# Patient Record
Sex: Male | Born: 1960 | Race: White | Hispanic: No | Marital: Married | Smoking: Current every day smoker
Health system: Southern US, Community
[De-identification: ages and names within clinical notes are randomized; demographics above are authoritative.]

## PROBLEM LIST (undated history)

## (undated) DIAGNOSIS — K760 Fatty (change of) liver, not elsewhere classified: Secondary | ICD-10-CM

## (undated) DIAGNOSIS — I219 Acute myocardial infarction, unspecified: Secondary | ICD-10-CM

## (undated) DIAGNOSIS — I2510 Atherosclerotic heart disease of native coronary artery without angina pectoris: Secondary | ICD-10-CM

## (undated) DIAGNOSIS — J189 Pneumonia, unspecified organism: Secondary | ICD-10-CM

## (undated) DIAGNOSIS — C801 Malignant (primary) neoplasm, unspecified: Secondary | ICD-10-CM

## (undated) DIAGNOSIS — F419 Anxiety disorder, unspecified: Secondary | ICD-10-CM

## (undated) DIAGNOSIS — E7800 Pure hypercholesterolemia, unspecified: Secondary | ICD-10-CM

## (undated) DIAGNOSIS — I10 Essential (primary) hypertension: Secondary | ICD-10-CM

## (undated) DIAGNOSIS — J449 Chronic obstructive pulmonary disease, unspecified: Secondary | ICD-10-CM

## (undated) DIAGNOSIS — F32A Depression, unspecified: Secondary | ICD-10-CM

## (undated) HISTORY — PX: APPENDECTOMY: SHX54

## (undated) HISTORY — DX: Atherosclerotic heart disease of native coronary artery without angina pectoris: I25.10

## (undated) HISTORY — DX: Fatty (change of) liver, not elsewhere classified: K76.0

---

## 1998-04-05 ENCOUNTER — Emergency Department (HOSPITAL_COMMUNITY): Admission: EM | Admit: 1998-04-05 | Discharge: 1998-04-05 | Payer: Self-pay

## 2000-12-11 ENCOUNTER — Emergency Department (HOSPITAL_COMMUNITY): Admission: EM | Admit: 2000-12-11 | Discharge: 2000-12-11 | Payer: Self-pay | Admitting: Emergency Medicine

## 2000-12-11 ENCOUNTER — Encounter: Payer: Self-pay | Admitting: Emergency Medicine

## 2003-10-18 DIAGNOSIS — I219 Acute myocardial infarction, unspecified: Secondary | ICD-10-CM

## 2003-10-18 HISTORY — DX: Acute myocardial infarction, unspecified: I21.9

## 2004-01-25 ENCOUNTER — Inpatient Hospital Stay (HOSPITAL_COMMUNITY): Admission: AD | Admit: 2004-01-25 | Discharge: 2004-01-29 | Payer: Self-pay | Admitting: Emergency Medicine

## 2004-01-25 HISTORY — PX: CARDIAC CATHETERIZATION: SHX172

## 2004-12-31 ENCOUNTER — Inpatient Hospital Stay (HOSPITAL_COMMUNITY): Admission: EM | Admit: 2004-12-31 | Discharge: 2005-01-03 | Payer: Self-pay | Admitting: Orthopedic Surgery

## 2005-01-03 HISTORY — PX: CARDIAC CATHETERIZATION: SHX172

## 2005-05-12 ENCOUNTER — Inpatient Hospital Stay (HOSPITAL_COMMUNITY): Admission: EM | Admit: 2005-05-12 | Discharge: 2005-05-15 | Payer: Self-pay | Admitting: Emergency Medicine

## 2005-05-12 ENCOUNTER — Ambulatory Visit (HOSPITAL_COMMUNITY): Admission: RE | Admit: 2005-05-12 | Discharge: 2005-05-12 | Payer: Self-pay | Admitting: Family Medicine

## 2005-05-12 ENCOUNTER — Encounter (INDEPENDENT_AMBULATORY_CARE_PROVIDER_SITE_OTHER): Payer: Self-pay | Admitting: General Surgery

## 2005-05-24 ENCOUNTER — Inpatient Hospital Stay (HOSPITAL_COMMUNITY): Admission: EM | Admit: 2005-05-24 | Discharge: 2005-05-27 | Payer: Self-pay | Admitting: *Deleted

## 2005-06-12 ENCOUNTER — Emergency Department (HOSPITAL_COMMUNITY): Admission: EM | Admit: 2005-06-12 | Discharge: 2005-06-13 | Payer: Self-pay | Admitting: Emergency Medicine

## 2006-06-12 ENCOUNTER — Ambulatory Visit (HOSPITAL_COMMUNITY): Admission: RE | Admit: 2006-06-12 | Discharge: 2006-06-12 | Payer: Self-pay | Admitting: Family Medicine

## 2006-09-29 ENCOUNTER — Ambulatory Visit: Payer: Self-pay | Admitting: Internal Medicine

## 2007-01-30 ENCOUNTER — Ambulatory Visit (HOSPITAL_COMMUNITY): Admission: RE | Admit: 2007-01-30 | Discharge: 2007-01-30 | Payer: Self-pay | Admitting: Family Medicine

## 2007-05-15 ENCOUNTER — Ambulatory Visit: Payer: Self-pay | Admitting: Internal Medicine

## 2007-08-08 IMAGING — CT CT ABDOMEN W/ CM
1 of 3 series · 14 of 32 positions shown, 19 images · IV contrast (omnipaque)
Comparison: 05/12/05.

CLINICAL DATA: Abdominal pain.  Appendix removed on 05/11/05.
 ABDOMEN CT WITH CONTRAST:
TECHNIQUE: Multidetector CT imaging of the abdomen was performed following the standard protocol during bolus administration of intravenous contrast.
 Contrast:  150 cc Omnipaque 300.
TECHNIQUE: Multidetector CT imaging of the pelvis was performed following the standard protocol during bolus administration of intravenous contrast.

[Series 6513: — · axial · 0.88mm/px · z∈[+1193,+1623]mm · 14 of 98 slices shown, 19 images]
[im 6/98  soft-tissue]
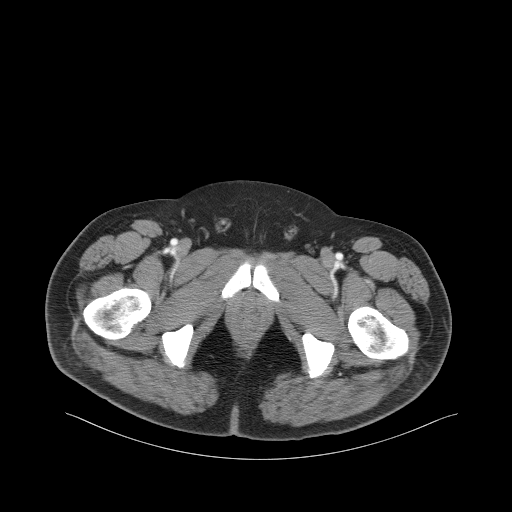
[im 6/98  bone]
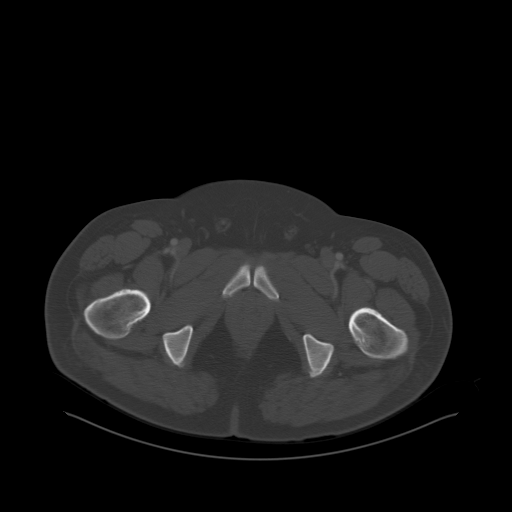
[im 11/98  soft-tissue]
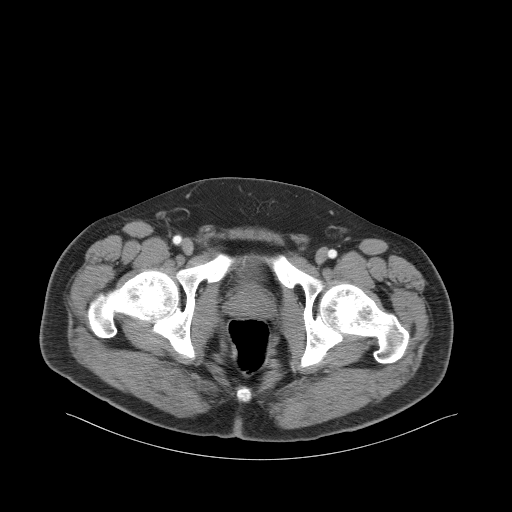
[im 22/98  soft-tissue]
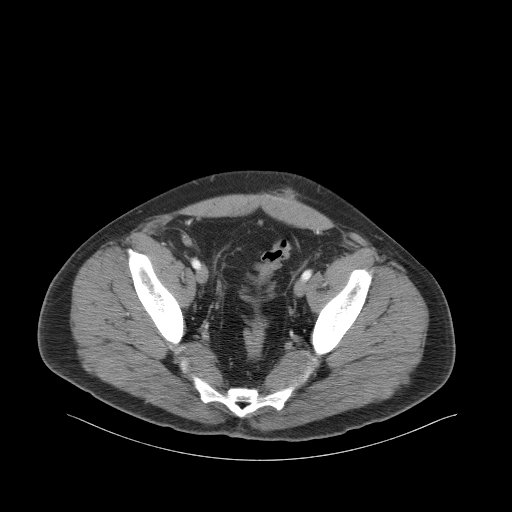
[im 27/98  soft-tissue]
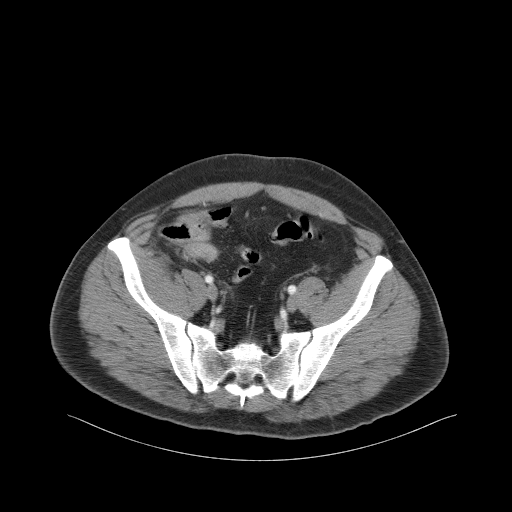
[im 33/98  soft-tissue]
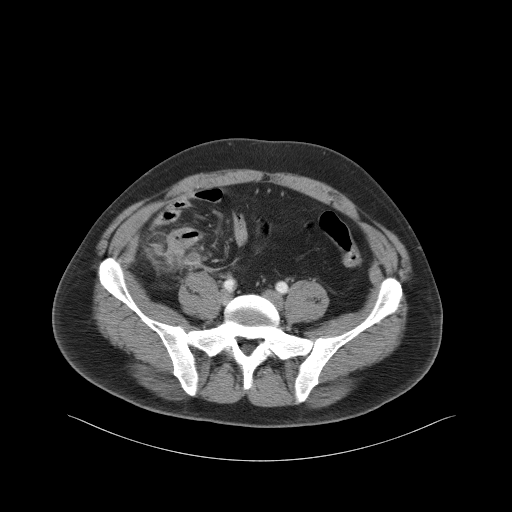
[im 44/98  soft-tissue]
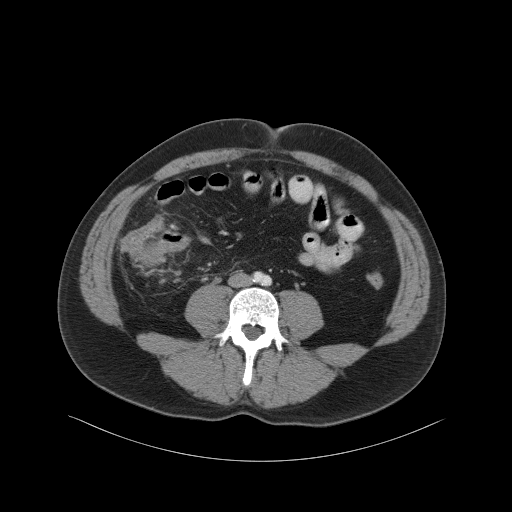
[im 49/98  soft-tissue]
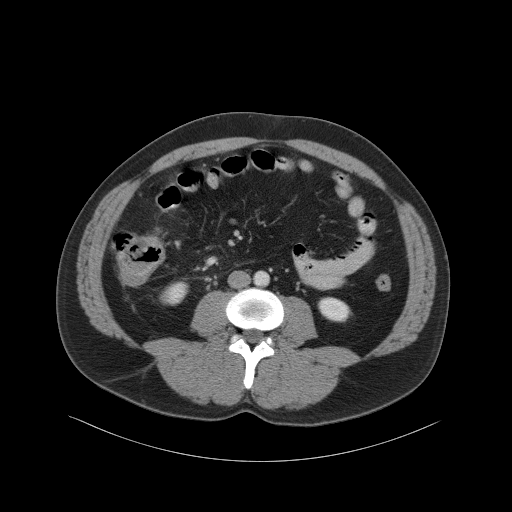
[im 54/98  soft-tissue]
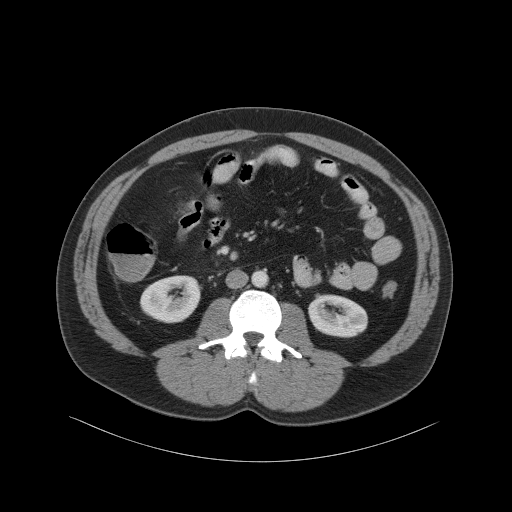
[im 65/98  soft-tissue]
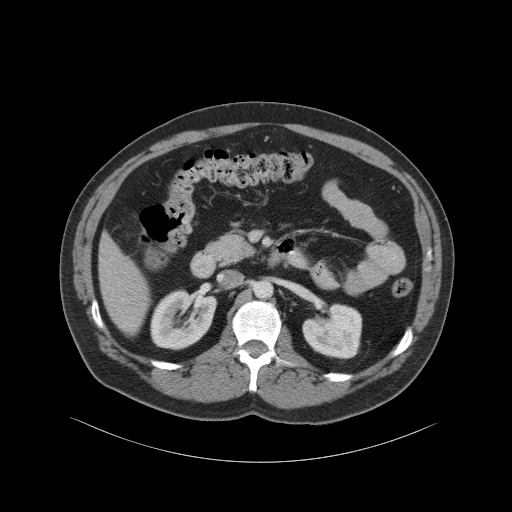
[im 65/98  bone]
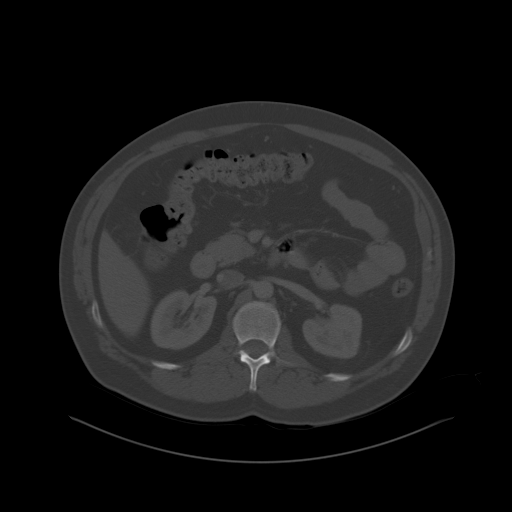
[im 71/98  soft-tissue]
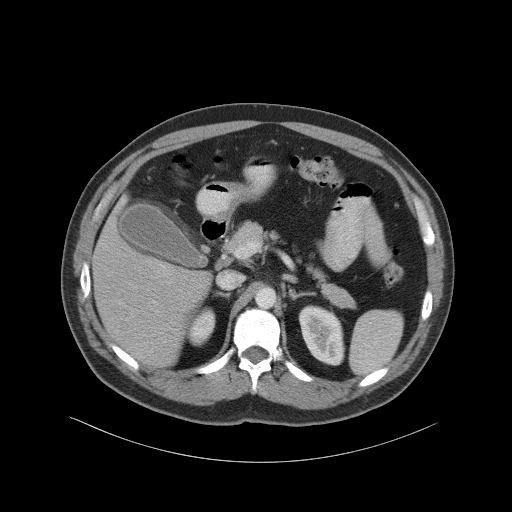
[im 76/98  soft-tissue]
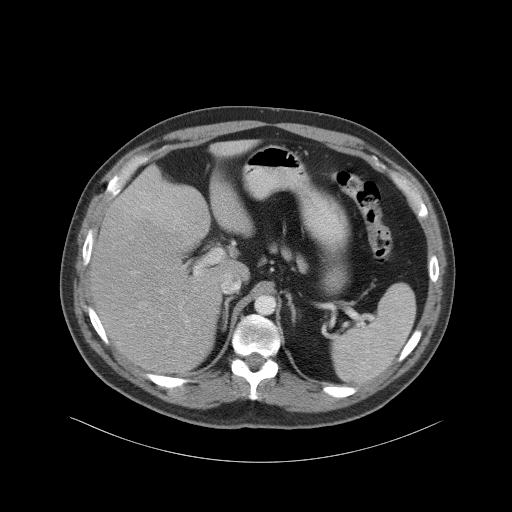
[im 76/98  lung]
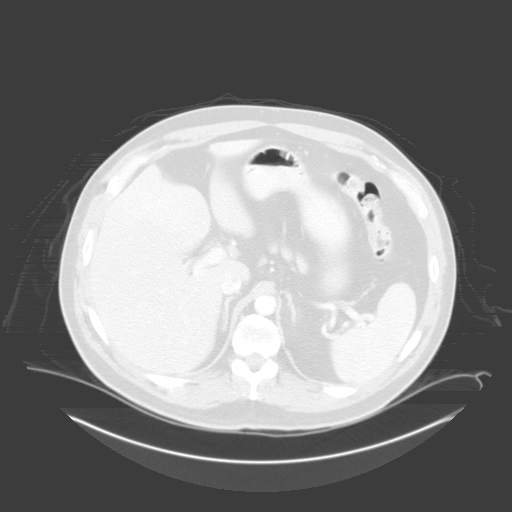
[im 81/98  lung]
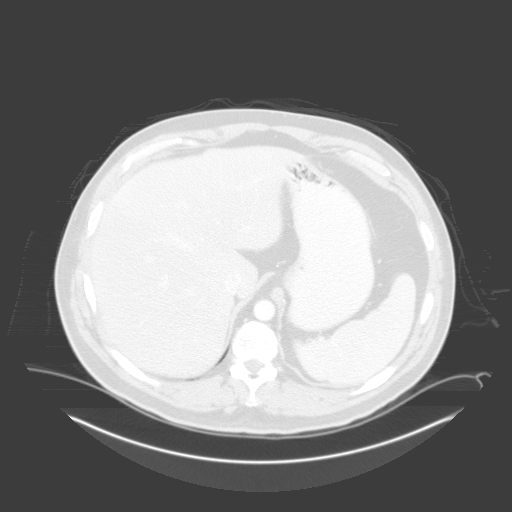
[im 87/98  soft-tissue]
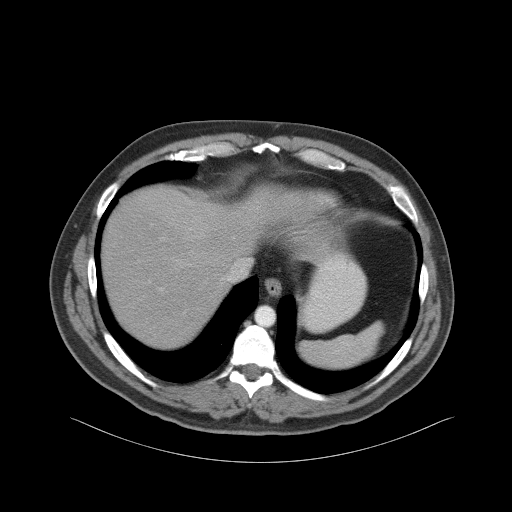
[im 87/98  lung]
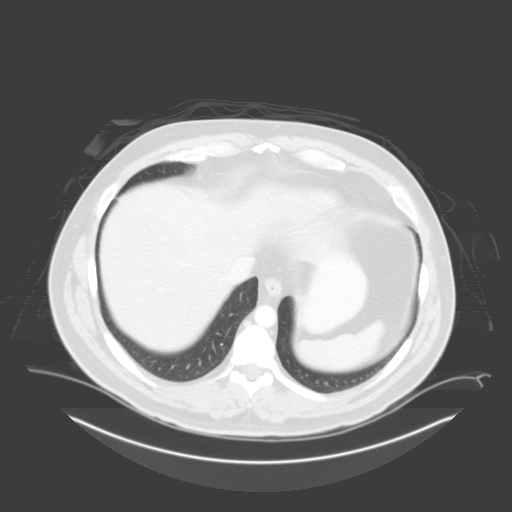
[im 92/98  soft-tissue]
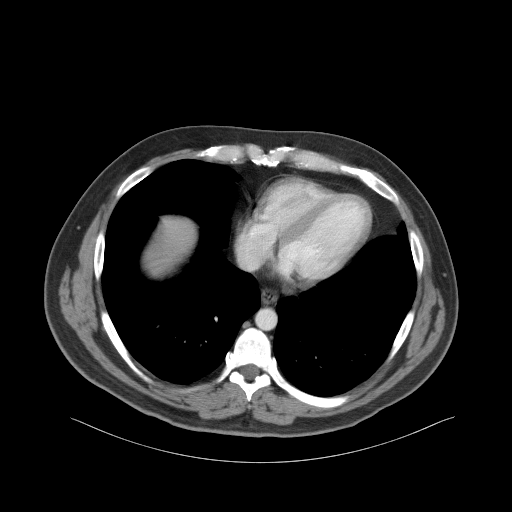
[im 92/98  lung]
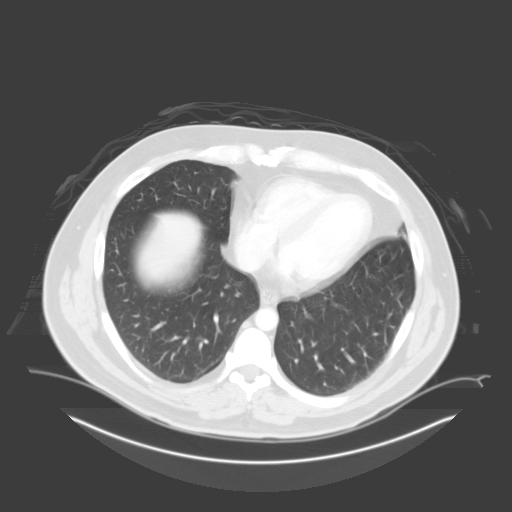

[14 of 32 positions shown; findings below may reference images not displayed]

FINDINGS: There is subsegmental atelectasis in the lingula.  
 There is gallbladder wall thickening and potentially mild pericholecystic fluid.  Correlate clinically in assessing for acute cholecystitis.
 No intrahepatic bile duct dilatation is evident.  The spleen, adrenal glands, and kidneys appear unremarkable.  There are at least two left renal arteries.  
 No retroperitoneal adenopathy.  The pancreas appears normal.  No dilated upper abdominal small bowel.
IMPRESSION: Wall thickening in the gallbladder, cannot excluded cholecystitis.
 PELVIS CT WITH CONTRAST:
FINDINGS: There are several small fluid density collections immediately adjacent to the cecum and along the clips associated with the appendectomy.  Additionally, the terminal ileum, which is in this immediate vicinity, is thick-walled, and there is surrounding mesenteric stranding slightly greater than would be expected for removal of the appendix 11 days ago.  The possibility of early abscess formation in this region is raised, although these could simply be postoperative fluid collections.  Correlate with the location of the patient?s pain and leukocytosis. 
 No free pelvic fluid is evident.
IMPRESSION: Thick-walled cecum and terminal ileum with several small fluid density collections in the region of the appendectomy, suspicious for the possibility of early abscess formation, although postoperative fluid collections could conceivably have a similar appearance.  Correlate with the location of the patient?s pain and with any leukocytosis.

## 2007-08-08 IMAGING — CR DG ABDOMEN 2V
2 series · 2 of 2 positions shown · non-contrast
Comparison: CT of the abdomen from 05/12/05.

CLINICAL DATA: Abdominal pain.  Nausea and vomiting. 
 2-VIEW ABDOMEN:

[view not recorded (1 of 2)]
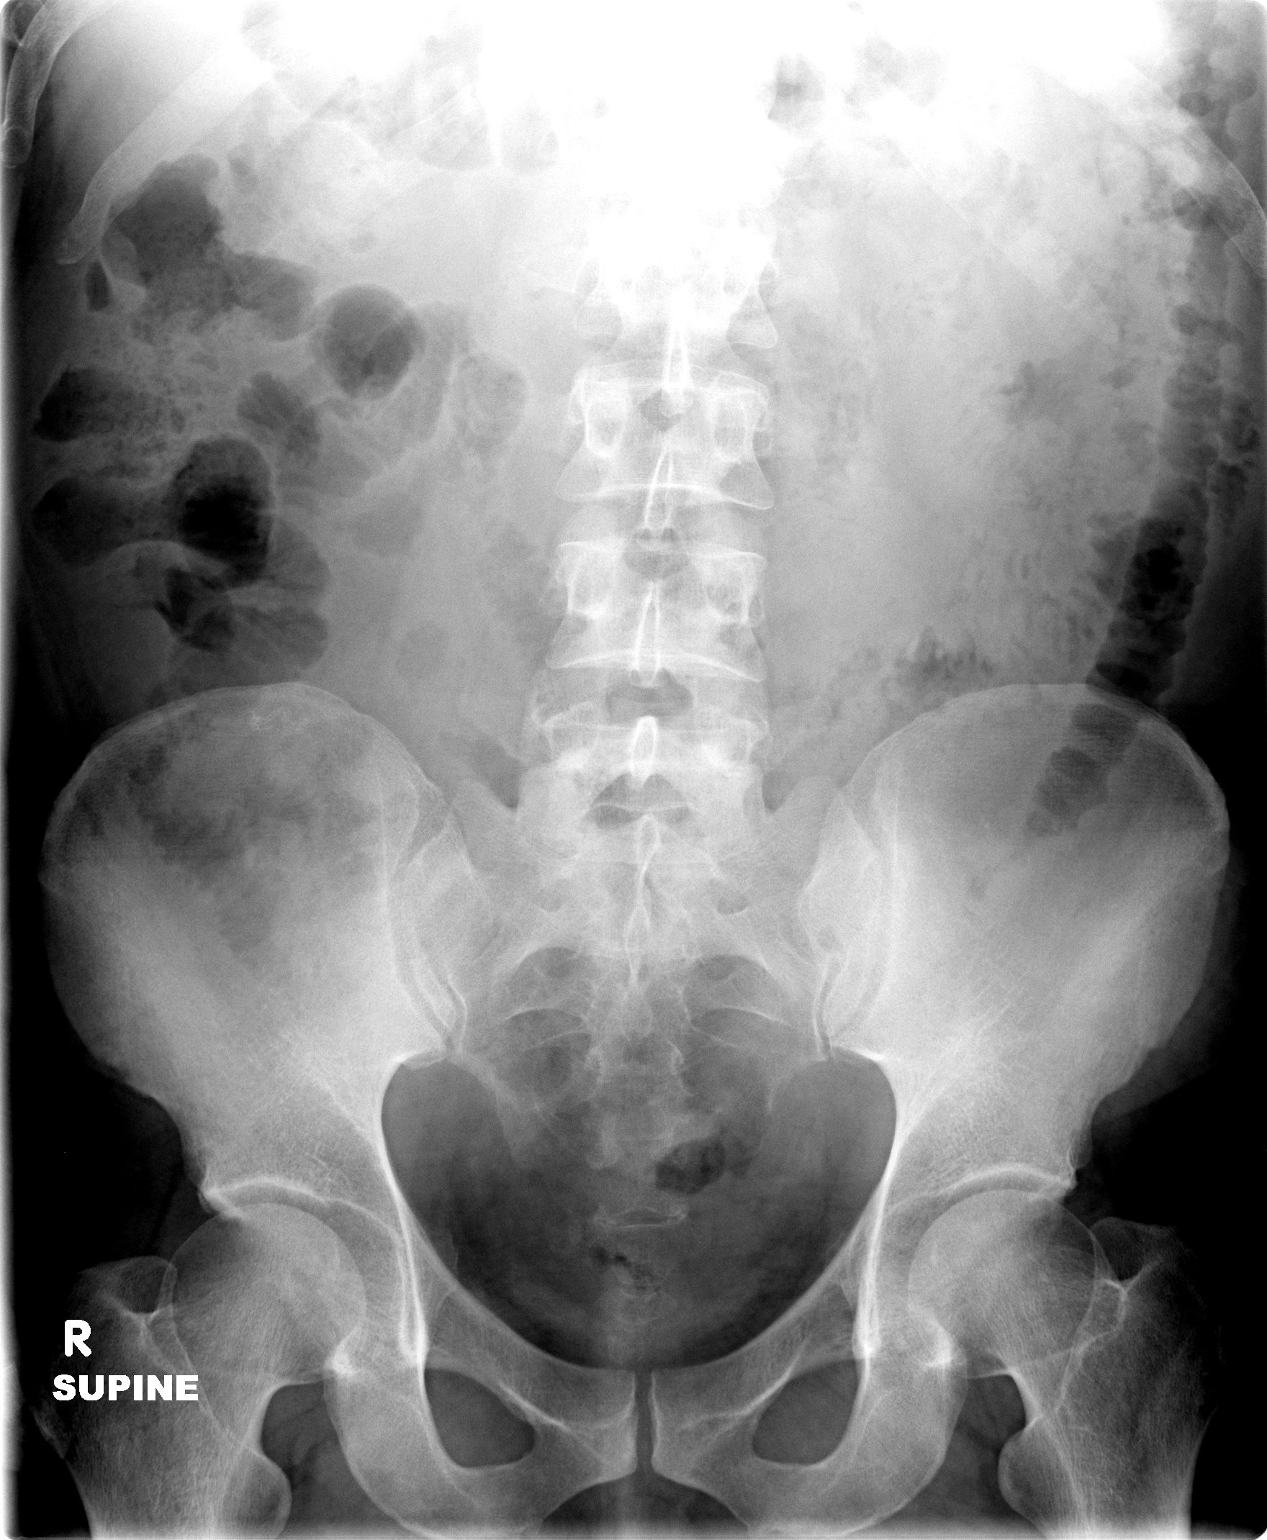

[view not recorded (2 of 2)]
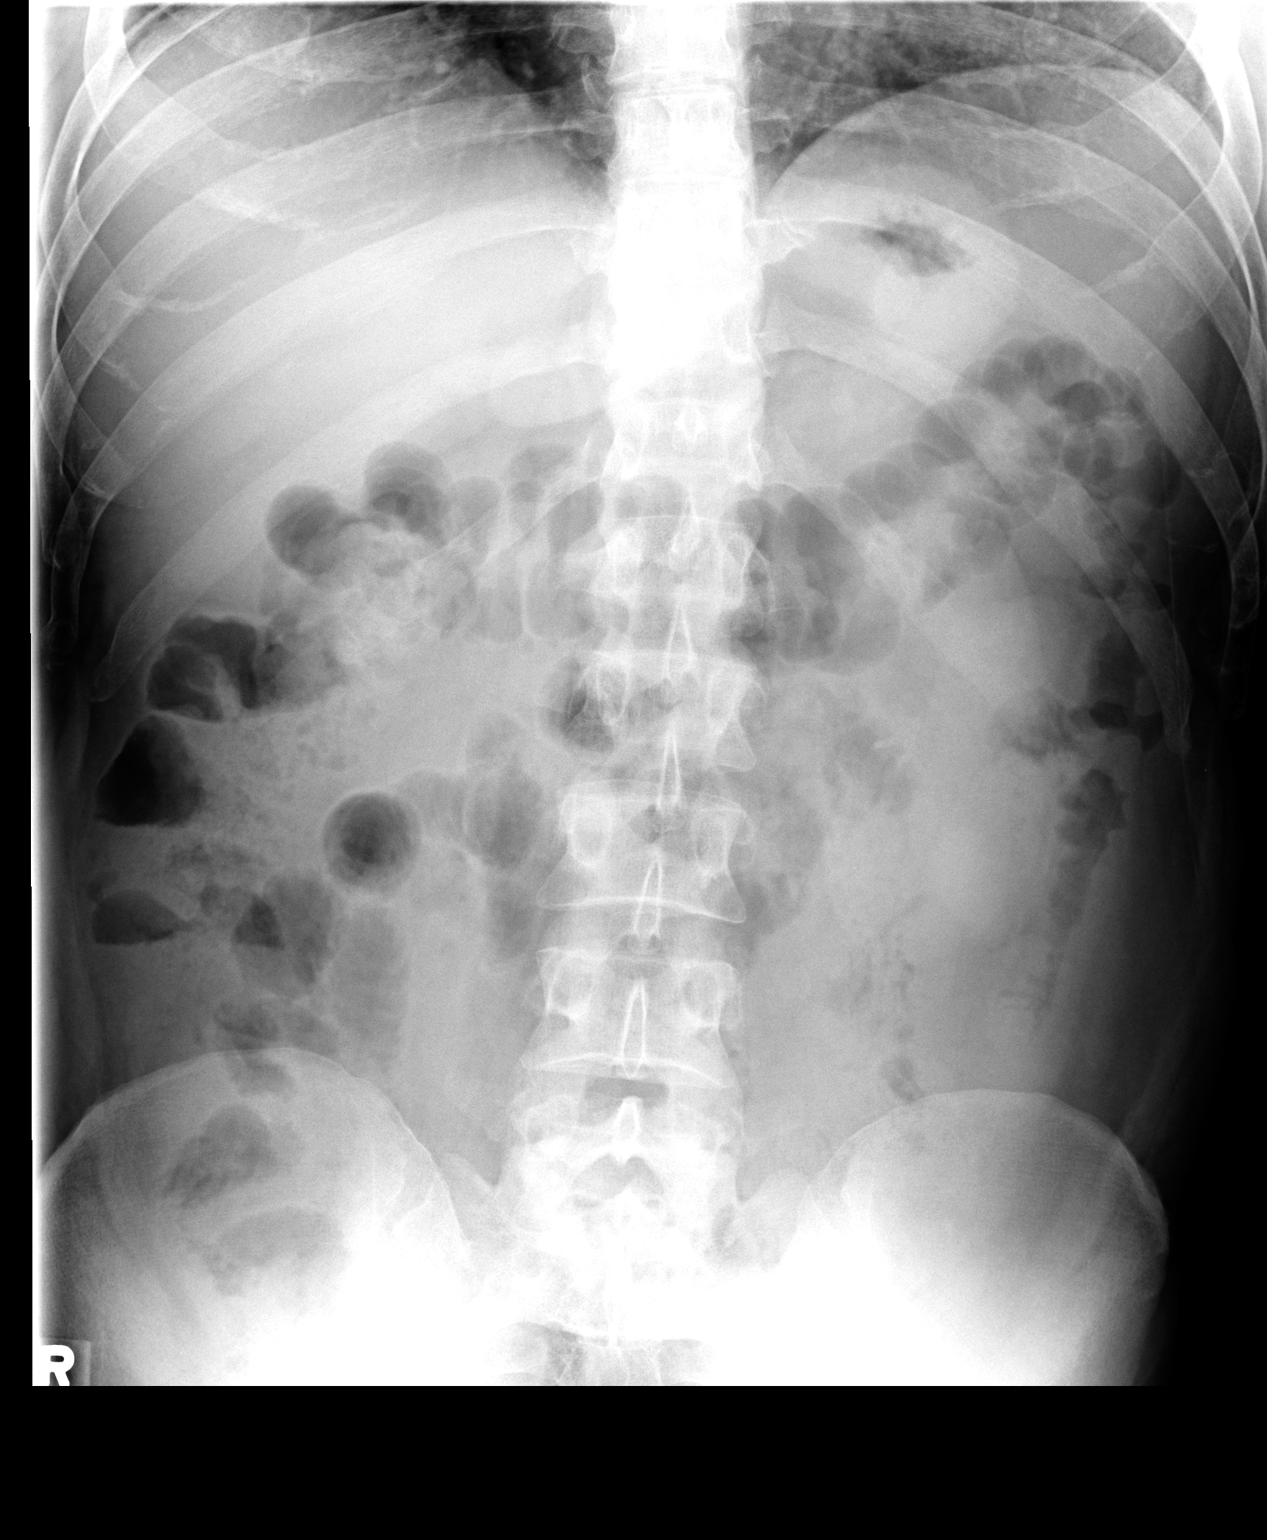

[2 of 2 positions shown; findings below may reference images not displayed]

FINDINGS: No dilated bowel.  Gas and stool are present in the colon and there is scattered gas in nondilated small bowel.  No significant abnormal air fluid levels.
IMPRESSION: No evidence of dilated bowel or abnormal air fluid levels.

## 2010-04-05 ENCOUNTER — Emergency Department (HOSPITAL_COMMUNITY): Admission: EM | Admit: 2010-04-05 | Discharge: 2010-04-05 | Payer: Self-pay | Admitting: Emergency Medicine

## 2010-11-17 HISTORY — PX: NM MYOCAR PERF WALL MOTION: HXRAD629

## 2011-01-02 LAB — GLUCOSE, CAPILLARY
Glucose-Capillary: 248 mg/dL — ABNORMAL HIGH (ref 70–99)
Glucose-Capillary: 302 mg/dL — ABNORMAL HIGH (ref 70–99)

## 2011-01-02 LAB — URINALYSIS, ROUTINE W REFLEX MICROSCOPIC
Bilirubin Urine: NEGATIVE
Glucose, UA: >1000 mg/dL — AB
Ketones, ur: NEGATIVE mg/dL
Leukocytes, UA: NEGATIVE
Nitrite: NEGATIVE
Protein, ur: NEGATIVE mg/dL
Specific Gravity, Urine: 1.025 (ref 1.005–1.030)
Urobilinogen, UA: 0.2 mg/dL (ref 0.0–1.0)
pH: 5.5 (ref 5.0–8.0)

## 2011-01-02 LAB — URINE MICROSCOPIC-ADD ON

## 2011-03-01 NOTE — Assessment & Plan Note (Signed)
NAME:  DEWANE, TIMSON                CHART#:  [id number]   DATE:  05/15/2007                       DOB:  1961-04-30   CHIEF COMPLAINT:  Followup of liver.   SUBJECTIVE:  Raphael is here for a followup.  He has a history of elevated  transaminases, particularly the SGPT.  His labs were checked on April 09, 2007 and his SGPT  was 76, which was up from 60 back in April 2008.  He  was seen in initial consultation by Dr. Jena Gauss in December 2007.  The  patient is notably significantly over his ideal body weight and has  hyperlipidemia.  His SGPT has been elevated for more than 2 years.  He  has been off-and-on statin therapy for a good part of this time.  Currently he is on Tricor.  It is felt that he most likely has  steatohepatitis with associated metabolic syndrome and some of his  elevations of the SGPT may be related to his statin therapy.  He ruled  out for chronic hepatitis B or C, as well as iron overload.  The patient  unfortunately has not been able to lose any weight.  He is actually  gained 5 pounds since we initially saw him in December 2007.  He also  admittedly continues to consume alcohol, although he reports it is very  limited.  He states he consumes about 6 beers a month.  He is no longer  drinking his family's white lightening.  He denies any nausea or  vomiting, abdominal pain, constipation, diarrhea, melena or rectal  bleeding.   CURRENT MEDICATIONS:  See updated list.   ALLERGIES:  No known drug allergies.   PHYSICAL EXAMINATION:  Weight 211, height 5 feet 6 inches, temp 98.1,  blood pressure  118/82, pulse 60.  His BMI is 34.1.  GENERAL:  A pleasant, mildly obese Caucasian male in no acute distress.  SKIN:  Warm and dry, no jaundice.  HEENT:  Sclerae nonicteric, oropharyngeal mucosa moist and pink.  ABDOMEN:  Obese.  Positive bowel sounds.  Soft, nontender.  No  organomegaly or masses.  No rebound tenderness or guarding, no abdominal  bruits or hernias.  EXTREMITIES:  No edema.   IMPRESSION:  Mr. Pavich is a 50 year-old gentleman with history of  elevated SGPT, most likely related to underlying steatohepatitis and/or  lipid lowering medications.  He really has not put forth much effort  with regards to weight loss or regular aerobic exercise.  He continues  to drink, although he states this is on a limited basis.   PLAN:  At this point in time, I have encouraged Raju to pursue aerobic  activity at least 3 times weekly and would like for him to lose 10  pounds over the course of the next 3 months.  I recommend that he  discontinue all alcohol consumption.  We will plan on rechecking his  LFTs in  October 2008.  If his SGPT continues to rise, he may be looking at  having a liver biopsy in the near future.       Tana Coast, P.A.  Electronically Signed     R. Roetta Sessions, M.D.  Electronically Signed    LL/MEDQ  D:  05/15/2007  T:  05/16/2007  Job:  [id number]   cc:   Delorise Jackson,  M.D. Domingo Sep

## 2011-03-04 NOTE — Cardiovascular Report (Signed)
NAME:  Nathan [name], Nathan [name] NO.:  [id number]   MEDICAL RECORD NO.:  [id number]                   PATIENT TYPE:  INP   LOCATION:  2930                                 FACILITY:  [facility]   PHYSICIAN:  Nicki Guadalajara, M.D.                  DATE OF BIRTH:  09/18/1961   DATE OF PROCEDURE:  01/25/2004  DATE OF DISCHARGE:                              CARDIAC CATHETERIZATION   INDICATIONS:  [name] is 50 year old gentleman who has a history  of hypertension and greater than 25 years of tobacco use.  He has been  having stuttering chest pain intermittently over the last several days.  Last evening at approximately 10:30 p.m., his chest pain became more  intense.  At approximately 3:30 this morning, he presented to [facility]  Emergency Room where he was found to have mild ST segment elevation changes  inferiorly.  His pain was coming and going.  He was treated with integrilin  and ultimately brought to the catheterization laboratory for emergent  cardiac catheterization.   DESCRIPTION OF PROCEDURE:  After premedication with Versed 2 mg  intravenously, the patient was prepped and draped in the usual fashion.  His  right femoral artery was punctured anteriorly and a 6 French sheath was  inserted.  Diagnostic catheterization was done with 6 French Judkins 4 left  and right coronary catheters.  A 6 French pigtail catheter was used for  biplane cine left ventriculography.  Distal aortography was also performed.  With the demonstration of subtotal stenosis in the RCA, attention was then  directed at primary intervention.  An ACT was documented.  The patient  received an additional 2000 units of heparin.  600 mg of oral Plavix was  given.  Initially, 6 [place] JR guide was used, but due to catheter dampening  this was changed to a 6 [place] right catheter with side holes.  A Luge wire  was advanced down the RCA, across the subtotal stenosis.  Predilation was  done with  a 2.25 x 15-mm Maverick balloon to 10 atmospheres.  A 3.5 x 18-mm  drug-eluting stent Cypher stent was then successfully deployed at dilated to  16 atmospheres.  Post stent dilatation was done utilizing a 3.75 x 15-mm  Quantum balloon to 12 atmospheres.  Scout angiography confirmed an excellent  angiographic result.  The patient did receive several additional doses of IC  nitroglycerin during the procedure.  ACT was documented to be therapeutic at  the completion of the procedure.  The arterial sheath was sutured in place  with plans for sheath removal later today.   HEMODYNAMIC DATA:  Central aortic pressure was 100/72, left ventricular  pressure 100/14, post A-wave 21.   ANGIOGRAPHIC DATA:  The left main coronary artery was angiographically  normal and bifurcated into an LAD and left circumflex system.   The LAD gave rise to proximal diagonal vessel.  The  diagonal vessel in its  mid segment bifurcated into two small limbs.  There was 70% stenosis  diffusely in the superior limb and 80% stenosis followed by 50% stenosis in  the inferior limb in the mid portion of this diagonal vessel.  The LAD  beyond the diagonal had 20-30% smooth somewhat eccentric narrowing.   The circumflex vessel gave rise to a prominent marginal vessel. There was  30% proximal narrowing in this marginal vessel.   The right coronary artery was a dominant vessel.  There was 99% stenosis  with mild thrombus in the mid segment of the vessel in the region of an  anterior RV marginal takeoff.  The distal RCA gave rise to a PDA and a  posterior lateral system.  There was evidence for probable spasm of the  proximal RCA which improved following IC nitroglycerin administration.  Following successful percutaneous transluminal coronary angioplasty with  stenting utilizing a 3.5 x 18-mm drug-eluting stent Cypher stent post  dilated to 3.75 mm, the 99% stenosis was reduced to 0%.  The initial TIMI-  2.5 to 3 flow was  improved to TIMI-3.  There was no evidence for dissection.   Biplane cine left ventriculography done prior to the intervention showed  preserved global contractility. However, there was mild inferior  contractility at the mid to basal segment and in the inferoapical region.   Distal aortography did not demonstrate any renal artery stenosis.  There was  smooth 30-40% narrowing in the proximal portion of the left common iliac  artery.   IMPRESSION:  1. Acute inferior wall myocardial infarction secondary to subtotal 99%     stenosis in the mid right coronary artery.  2. 20-30% smooth narrowing in the proximal left anterior descending after     the first diagonal vessel with 70 and 80% stenoses in the superior and     inferior branches midway down the first diagonal vessel.  3. 30% narrowing in the proximal segment of the obtuse marginal branch of     the left circumflex coronary artery.  4. 99% stenosis with mild thrombus burden in the mid right coronary with     TIMI-2.5 to 3 flow initially.  5. Successful percutaneous transluminal coronary angioplasty/stenting     utilizing a 3.5 x 18-mm drug-eluting Cypher stent post dilated to 3.75 mm     with TIMI-3 flow and stenosis being reduced to 0%.  6. 30-40% left common iliac narrowing.  7. Preserved global left ventricular function with mild inferior and     inferoapical hypocontractility.  8. Double bolus integrilin, 600 mg oral clopidogrel.                                               Nicki Guadalajara, M.D.    TK/MEDQ  D:  01/25/2004  T:  01/26/2004  Job:  [id number]   cc:   Orville Govern  [facility]

## 2011-03-04 NOTE — H&P (Signed)
NAME:  Nathan [name], Nathan [name] NO.:  [id number]   MEDICAL RECORD NO.:  [id number]          PATIENT TYPE:  INP   LOCATION:  A326                          FACILITY:  [facility]   PHYSICIAN:  Dalia Heading, M.D.  DATE OF BIRTH:  10-19-1960   DATE OF ADMISSION:  05/23/2005  DATE OF DISCHARGE:  LH                                HISTORY & PHYSICAL   AGE:  50 years old.   CHIEF COMPLAINT:  Right-sided abdominal pain and fevers.   HISTORY OF PRESENT ILLNESS:  The patient is a 49 year old white male status  post a laparoscopic appendectomy on 05/12/05 for a suppurative appendicitis  who presents with right-sided abdominal pain.  He presented to the emergency  room for further evaluation and treatment.  He was noted to have a white  blood cell count of 21,000.  His CT scan of the abdomen and pelvis revealed  a thickened gallbladder wall as well as small amounts of fluid and  inflammation present around the cecum.  No pelvic abscess was noted.  The  patient is being admitted to the hospital for further evaluation and  treatment.   PAST MEDICAL HISTORY:  Hypertension, high cholesterol levels.   PAST SURGICAL HISTORY:  As noted above.   CURRENT MEDICATIONS:  1.  Plavix 75 mg p.o. daily.  2. Metoprolol 25 mg p.o. b.i.d.  3. Baby      aspirin 1 tablet p.o. daily.  4. Niaspan 1 gram p.o. q.h.s.  5. Altace      2.5 mg p.o. q.h.s.  6. Zocor 1 tablet p.o. q.h.s.  7. Lexapro 5 mg p.o.      q.h.s.  8. Vicodin 1-2 tablets p.o. q.4h. p.r.n. pain.   ALLERGIES:  No known drug allergies.   REVIEW OF SYSTEMS:  Noncontributory.   PHYSICAL EXAMINATION:  The patient is a well-developed, well-nourished white  male in no acute distress.  His T-max is 100.7.  Vital signs are stable.   Lungs are clear to auscultation with equal breath sounds bilaterally.  Heart  examination reveals a regular rate and rhythm without S3, S4, or murmurs.  The abdomen is soft with some tenderness noted in the right lower  quadrant  to deep palpation.  All surgical incision sites have healed well.  Med-7 is  within normal limits.  White blood cell count is 21.8, hematocrit 40,  platelet count 533, amylase and lipase are within normal limits.  Liver  enzyme tests reveal a slightly elevated SGPT at 42.  The rest are within  normal limits.   IMPRESSION:  Possible early development of intra-abdominal abscess, status  post laparoscopic appendectomy.   PLAN:  I discussed the CT scan findings with radiology.  It is not felt that  this is a well formed fluid collection.  At this point, no drainage  procedure is indicated.  He will be admitted to the hospital and started on  Zosyn intravenously.  A repeat CT scan will be performed in 48 hours.  Further management is pending those results.       MAJ/MEDQ  D:  05/24/2005  T:  05/24/2005  Job:  [id number]   cc:   [facility]

## 2011-03-04 NOTE — Discharge Summary (Signed)
NAME:  Nathan [name], Nathan [name] NO.:  [id number]   MEDICAL RECORD NO.:  [id number]          PATIENT TYPE:  INP   LOCATION:  A326                          FACILITY:  [facility]   PHYSICIAN:  Dalia Heading, M.D.  DATE OF BIRTH:  Oct 29, 1960   DATE OF ADMISSION:  05/23/2005  DATE OF DISCHARGE:  08/11/2006LH                                 DISCHARGE SUMMARY   HOSPITAL COURSE SUMMARY:  The patient is a 50 year old white male status  post a laparoscopy appendectomy approximately two weeks ago who presented  with right lower quadrant abdominal pain and fevers. He was noted to have a  leukocytosis of around 21,000. A CT scan of the pelvis was performed which  revealed inflammation and a possible early abscess formation in the right  lower quadrant. No contrast extravasation was noted. The patient was  admitted to the hospital for intravenous antibiotic therapy and monitoring.  His white count has decreased since his admission. A repeat CT scan done two  days later reveals progressive resolution of the fluid collection. The  patient feels better. He has been afebrile over last 24 hours. White blood  cell count at the time of discharge is 11.7.   The patient is being discharged home in good and improving condition.   DISCHARGE INSTRUCTIONS:  The patient is to follow up Dr. Franky Macho on  June 02, 2005.   DISCHARGE MEDICATIONS:  1.  Vicodin 1-2 tablets p.o. q.4 h p.r.n. pain.  2.  Diflucan 100 mg p.o. q.d. for 1 week.  3.  Augmentin 875 mg p.o. b.i.d. for 1 week.  4.  Flagyl 250 mg p.o. t.i.d. for 1 week.   He is to resume all of his medications as previously prescribed.   PRINCIPAL DIAGNOSIS:  1.  Intra-abdominal abscess, resolving.  2.  Status post laparoscopic appendectomy.  3.  Hypertension.  4.  Depression.   PRINCIPAL PROCEDURE:  None.       MAJ/MEDQ  D:  05/27/2005  T:  05/27/2005  Job:  [id number]   cc:   [facility]

## 2011-03-04 NOTE — Discharge Summary (Signed)
NAME:  Nathan [name], THONE NO.:  [id number]   MEDICAL RECORD NO.:  [id number]          PATIENT TYPE:  INP   LOCATION:  4729                         FACILITY:  [facility]   PHYSICIAN:  Cristy Hilts. Jacinto Halim, MD       DATE OF BIRTH:  [birth year]   DATE OF ADMISSION:  12/31/2004  DATE OF DISCHARGE:  01/03/2005                                 DISCHARGE SUMMARY   DISCHARGE DIAGNOSES:  1.  Dyspnea.  2.  Chest pain - unstable angina pectoris - status post catheterization with      intervention during this admission.  3.  Peripheral vascular disease with 30 to 40% stenosis of the left common      iliac artery.  4.  Hypertension.  5.  Hyperlipidemia.  6.  Ongoing tobacco use.   HISTORY OF PRESENT ILLNESS/HOSPITAL COURSE:  A 50 year old Caucasian  gentleman patient of Dr. Domingo Sep.  Patient with complaints of chest  discomfort, also known weakness and shortness of breath that had been going  on for probably a week or so.  He does have a history of prior coronary  artery disease.  He had LAD with trace 30% stenosis by last Cardiolite in  2005, his EF was 71% and this was negative for ischemia.  Patient was  admitted to the telemetry unit rule out MI protocol and scheduled for  cardiac catheterization on January 03, 2005.   PROCEDURE:  Cardiac catheterization performed on January 03, 2005, by Dr.  Jacinto Halim.  It showed EF 65%, no change in coronary anatomy since last  catheterization on April 2005, in RCA stent and 30% stenosis of the proximal  LAD and 20 to 30% stenosis of the circumflex.  Patient tolerated the  procedure well and recommendations were to continue with medical therapy  with especially aggressive control with LDL goal of 70 and HDL goal greater  than 40.   Patient was reassessed later the afternoon of the same day and considered to  be stable for discharge home.   DISCHARGE MEDICATIONS:  1.  Niaspan 500 mg two pills at night.  2.  Plavix 75 mg daily.  3.  Aspirin 81 mg  daily.  4.  Crestor 5 mg daily.  5.  Lopressor 25 mg b.i.d.  6.  Altace 2.5 mg daily.  7.  Protonix 40 mg daily.  8.  Nitroglycerin as needed for pain.   LABORATORIES AND PERTINENT STUDIES:  His CBC showed white blood cell count  8.3, hemoglobin 15.7, hematocrit 43.8, platelet count 229.  BMP revealed  sodium of 140, potassium 3.9, chloride 104, CO2 27, glucose 107, BUN 10,  creatinine 1.0, calcium 9.1.  Liver function tests were normal.  Cardiac  panel normal x3.  Lipid profile revealed total cholesterol of 102,  triglycerides 153, HDL 27, LDL 44.   ACTIVITY:  No driving, no lifting greater than 5 pounds, no strenuous  activity for three days post catheterization.   DISCHARGE DIET:  Low salt, low cholesterol diet.   FOLLOW UP:  Patient was instructed to call our office in [place], [place].  Phone number was provided, to schedule follow-up appointment with  Dr. Domingo Sep in one to two weeks post discharge.       MK/MEDQ  D:  03/09/2005  T:  03/09/2005  Job:  [id number]   cc:   [facility]

## 2011-03-04 NOTE — Discharge Summary (Signed)
NAME:  Nathan [name], Nathan [name]                         ACCOUNT NO.:  [id number]   MEDICAL RECORD NO.:  [id number]                   PATIENT TYPE:  INP   LOCATION:  2016                                 FACILITY:  [facility]   PHYSICIAN:  Dani Gobble, MD                    DATE OF BIRTH:  1960-11-17   DATE OF ADMISSION:  01/25/2004  DATE OF DISCHARGE:  01/29/2004                                 DISCHARGE SUMMARY   DISCHARGE DIAGNOSES:  1. Diaphragmatic myocardial infarction treated with right coronary artery     CYPHER stenting, January 25, 2004.  2. Nonsustained ventricular tachycardia, peri-myocardial-infarction.  3. Treated hyperlipidemia.  4. History of smoking.   HOSPITAL COURSE:  The patient is a 50 year old male with a history of  hypertension and smoking, who has no family doctor.  He presented January 25, 2004 with chest pain consistent with unstable angina.  He had been  previously seen by Dr. Darlin Priestly several years ago and had a report  of a negative stress test at that time.  He lives in [place].  His EKG on  admission showed ST elevation in the inferior leads.  His troponin was  elevated, as was his MB.  He was taken urgently to the cath lab.  He  received IV heparin, IV nitroglycerin and IV Integrilin.  Catheterization  done by Dr. Nicki Guadalajara revealed 99% mid RCA with clot.  This was dilated  and stented with a CYPHER stent.  He had overall normal LV function.  Circumflex and LAD were without significant stenosis.  He had a 50% small  diagonal narrowing.  Renal arteries and iliacs were essentially clear; he  did have a 30% to 40% narrowing in the left iliac.  The patient did have  some peri-MI nonsustained ventricular tachycardia of up to 15 beats.  He  also had some hypotension and this limited our medications somewhat.  Ultimately, we were able to get him on low-dose ACE inhibitor and beta  blocker.  He was transferred to the floor and ambulated.  Zocor and Niaspan  were added; his HDL was 30, LDL 140.  We feel he can discharged, January 29, 2004.   DISCHARGE MEDICATIONS:  1. Altace 2.5 mg h.s.  2. Lipitor 40 mg a day.  3. Coated aspirin once a day.  4. Plavix 75 mg a day.  5. Niacin 500 mg at h.s.  6. Nitroglycerin sublingual p.r.n.  7. Metoprolol 25 mg twice a day.   LABORATORIES:  Sodium 138, potassium 3.9, BUN 9, creatinine 1.0.  White  count 10.6, hemoglobin 14.4, hematocrit 41.0, platelets 231,000.  INR 0.8.  Liver functions are normal with an AST of 35, ALT of 57.  Hemoglobin A1c was  normal at 5.4.  CKs peaked at 2.051 with 199 MBs.  Lipid profile shows a  cholesterol of 215, triglycerides 222, HDL 31, LDL 140.  TSH 2.40.   Chest x-ray showed no active disease.   His EKG shows sinus rhythm with inferior Q waves.   DISPOSITION:  The patient is discharged in stable condition.  He has been  instructed to not smoke.  He drives a truck for a living and we instructed  him to stay out of work until he is cleared by Dr. Dani Gobble.  He will  see Dr. Domingo Sep in [place], February 12, 2004 at 12:30.      Abelino Derrick, P.A.                      Dani Gobble, MD    LKK/MEDQ  D:  01/29/2004  T:  01/30/2004  Job:  [id number]   cc:   Dani Gobble, MD  Fax: [phone]

## 2011-03-04 NOTE — Op Note (Signed)
NAME:  SHINE, MIKES NO.:  [id number]   MEDICAL RECORD NO.:  [id number]          PATIENT TYPE:  INP   LOCATION:  A318                          FACILITY:  [facility]   PHYSICIAN:  Dalia Heading, M.D.  DATE OF BIRTH:  [birth year]   DATE OF PROCEDURE:  05/12/2005  DATE OF DISCHARGE:                                 OPERATIVE REPORT   PREOPERATIVE DIAGNOSIS:  Acute appendicitis.   POSTOPERATIVE DIAGNOSIS:  Acute appendicitis.   PROCEDURE:  Laparoscopic appendectomy.   SURGEON:  Dr. Franky Macho.   ANESTHESIA:  General endotracheal.   INDICATIONS:  The patient is a 50 year old white male who presents with a 24-  hour history of worsening right lower quadrant abdominal pain. CT scan of  the abdomen and pelvis reveals acute appendicitis. Risks and benefits of the  procedure including bleeding, infection, and the possibly of an open  procedure were fully explained to the patient, who gave informed consent.   PROCEDURE NOTE:  The patient was placed in the Trendelenburg position after  of the abdomen was prepped and draped in the usual sterile technique with  Betadine. Surgical site confirmation was performed.   A supraumbilical incision was made down to fascia. Veress needle was  introduced into the abdominal cavity, and confirmation of placement was done  using the saline drop test. The abdomen was then insufflated to 16 mmHg  pressure. An 11-mm trocar was introduced into the abdominal cavity under  direct visualization without difficulty. The patient was placed in deeper  Trendelenburg position. An additional 12-mm trocar was placed in the  suprapubic region, and a 5-mm trocar was placed in the left lower quadrant  region. The appendix was visualized and noted be acutely inflamed. There was  some purulent fluid surrounding it. It was very edematous. The mesoappendix  was divided using the harmonic scalpel. A standard Endo-GIA was then placed  across the base of the  appendix and fired. The appendix was removed using an  EndoCatch bag. A right lower quadrant was copiously irrigated with normal  saline. All fluid and air were then evacuated from the abdominal cavity  prior to removal of the trocars.   All wounds were irrigated with normal saline. All wounds were injected with  0.5% Sensorcaine. The supraumbilical fascia as well as suprapubic fascia  were reapproximated using a 0 Vicryl interrupted suture. All skin incisions  were closed using staples. Betadine ointment and dry sterile dressing were  applied.   All tape and needle counts were correct at the end of the procedure. The  patient was extubated in the operating room and went back to recovery room  awake in stable condition.   COMPLICATIONS:  None.   SPECIMEN:  Appendix.   BLOOD LOSS:  Minimal.       MAJ/MEDQ  D:  05/12/2005  T:  05/13/2005  Job:  [id number]   cc:   Kirk Ruths, M.D.  [address]  [place]  [place] [address]  Fax: [phone]

## 2011-03-04 NOTE — Discharge Summary (Signed)
NAME:  COURTEZ, TWADDLE NO.:  [id number]   MEDICAL RECORD NO.:  [id number]          PATIENT TYPE:  INP   LOCATION:  A318                          FACILITY:  [facility]   PHYSICIAN:  Dalia Heading, M.D.  DATE OF BIRTH:  November 15, 1960   DATE OF ADMISSION:  05/12/2005  DATE OF DISCHARGE:  07/30/2006LH                                 DISCHARGE SUMMARY   HOSPITAL COURSE:  Summary the patient is a 50 year old white male who  presented to the emergency room with right lower quadrant abdominal pain. CT  scan of the abdomen and pelvis revealed acute appendicitis. The patient was  taken to the operating room and underwent laparoscopic appendectomy. He was  found to have suppurative acute appendicitis. His postoperative course was  remarkable for fevers. He has defervesced since that point. He was admitted  due to relative hypoxia which has since resolved. His room air saturation is  91% on the day of discharge. White blood cell count is 15,000 and  decreasing.   The patient is being discharged home on May 15, 2005 in good and improving  condition.   DISCHARGE INSTRUCTIONS:  The patient is to follow up Dr. Franky Macho on  May 19, 2005.   DISCHARGE MEDICATIONS:  1.  Vicodin 1-2 tablets p.o. q.4 h p.r.n. pain.  2.  Levaquin 500 milligrams p.o. q.d. times 5 days.  3.  He is to resume all his other medications as previously prescribed.   PRINCIPAL DIAGNOSES:  1.  Acute appendicitis.  2.  Hypertension.  3.  Coronary artery disease.  4.  Hypoxemia, resolved.   PRINCIPAL PROCEDURE:  Laparoscopic appendectomy on May 12, 2005.       MAJ/MEDQ  D:  05/15/2005  T:  05/16/2005  Job:  [id number]   cc:   Kirk Ruths, M.D.  [address]  [place]  [place] [address]  Fax: [phone]

## 2011-03-04 NOTE — Cardiovascular Report (Signed)
NAME:  KEYMARION, BEARMAN NO.:  [id number]   MEDICAL RECORD NO.:  [id number]          PATIENT TYPE:  INP   LOCATION:  4729                         FACILITY:  [facility]   PHYSICIAN:  Cristy Hilts. Jacinto Halim, MD       DATE OF BIRTH:  Nov 12, 1960   DATE OF PROCEDURE:  01/03/2005  DATE OF DISCHARGE:                              CARDIAC CATHETERIZATION   PROCEDURE PERFORMED:  1.  Left ventriculography.  2.  Selective right and left coronary angiography.  3.  Right femoral angiography with closure of right femoral artery access      with 6 French Perclose.   INDICATIONS FOR PROCEDURE:  Mr. Axtman is a 50 year old Caucasian male with  a history of hypertension, hyperlipidemia, continued tobacco abuse, history  of inferior wall myocardial infarction, status post PTCA and stent of the  mid RCA with a 3.5 by 18 mm Cypher on January 25, 2004, presents with chest  pain at rest.  He had been experiencing increasing angina like symptoms in  the last one week.  Given these symptoms and his continued cardiac risk  factors, he was brought to the cardiac catheterization lab to evaluate his  coronary anatomy.   HEMODYNAMIC DATA:  Left ventricular  pressure 89/7 with end diastolic  pressure of 12 mmHg.  The aortic pressures were 98/62 with a mean of 79  mmHg.  There was no pressure gradient across the aortic valve.   ANGIOGRAPHIC DATA:  Left ventricle:  The left ventricular systolic function was normal with  ejection fraction estimated at 60-65%.  There was no significant mitral  regurgitation.  There was no wall motion abnormality.   Right coronary artery:  The right coronary artery is a large vessel.  It is  a dominant vessel.  The previously placed stent is widely patent in its mid  segment.  There is mild luminal irregularity noted in the proximal segment.   Left main:  The left main is large caliber vessel.  It is normal.   The left anterior descending  is a large vessel and gives rise to  moderate  size diagonal one and diagonal two.  They are widely patent.  There is mild  luminal irregularity in the mid LAD 20-30% stenosis.  This is unchanged from  prior cardiac catheterization during acute MI.   Circumflex:  The circumflex is a large caliber vessel.  This gives rise to a  moderate size AV groove and continues to a large obtuse marginal.  The  obtuse marginal has 20-30% smooth, luminal narrowing.   IMPRESSION:  1.  Normal left ventricular systolic function, ejection fraction 65%.  2.  Widely patent right coronary artery stent, the proximal segment has mild      luminal irregularity of 20-30% stenosis in the RCA, 20-30% stenosis in      the mid LAD, and obtuse marginal ostium has smooth 20-30% stenosis.      This coronary anatomy is unchanged from prior cardiac catheterization on      December 25, 2003.   RECOMMENDATIONS:  Continued risk modifications are indicated.  The patient  will be advised to quit smoking.  Aggressive lipid control with LDL goal of  70, HDL goal of greater than 40 is indicated.  The patient will follow up  with Dr. Ilene Qua and Dr. Assunta Found.   TECHNIQUES OF PROCEDURE:  In the usual sterile technique, using a 6 French  right femoral artery access,  a 6 [place] multipurpose catheter was advanced  from the ascending aorta and a 0.035 inch J-wire.  The catheter was gently  advanced to the left ventricle.  Left ventricular pressure was monitored.  Hand contrast injection of the left ventricle was performed, both in LAO and  RAO projections.  The catheter was flushed with saline and pulled back into  the ascending aorta and pressure gradient across the aortic valve was  monitored.  The right coronary artery was selectively engaged and  angiography was performed.  Because of catheter induced spasm, the catheter  was pulled out after giving 200 mcg of intracoronary nitroglycerin.  Then,  the catheter was manipulated to engage the left main coronary  artery and  angiography was performed.  Then, the catheter was pulled out of the body in  the usual fashion.  A 6 French Judkins diagnostic catheter was utilized and  engaged the right femoral artery.  Because of persistent spasm of the RCA  and dampening with the catheter engagement, 200 mcg of intracoronary  nitroglycerin was gently administered, catheter pulled out of the body, and  a 5 [place] Noto catheter was utilized to engage the right coronary artery  and angiography was repeated.  The spasm had completely resolved.  Then, the  catheter was pulled from the body in the usual fashion.  Right femoral  angiography was performed through a large access sheath and the  access was  closed with 6 [place] Perclose with excellent hemostasis obtained.  The  patient tolerated the procedure well.  No immediate complications noted.      JRG/MEDQ  D:  01/03/2005  T:  01/03/2005  Job:  [id number]   cc:   Corrie Mckusick, M.D.  Fax: [phone]   Dani Gobble, MD  Fax: [phone]

## 2011-08-16 NOTE — H&P (Signed)
NTS SOAP Note  Vital Signs:  Vitals as of: 08/16/2011: Systolic 158: Diastolic 92: Heart Rate 67: Temp 97.73F: Height 47ft 6in: Weight 200Lbs 0 Ounces: BMI 32  BMI : 32.28 kg/m2  Subjective: This 50 Years 1 Months old Male presents for of need for screening TCS. Denies any GI complaints.  Review of Symptoms:  Constitutional: unremarkable  Head: unremarkable  Eyes:unremarkable  Nose/Mouth/Throat:unremarkable  Cardiovascular:unremarkable  Respiratory: unremarkable  Gastrointestinal:unremarkable  Genitourinary: unremarkable  Musculoskeletal: unremarkable  Skin: unremarkable  Hematolgic/Lymphatic: unremarkable  Allergic/Immunologic:unremarkable    Past Medical History:Reviewed  Past Medical History  Surgical History: coronary stent placement in 2005 Medical Problems: Diabetes, Heart problems, High Blood pressure Allergies: nkda Medications: metoprolol, plavix, ramipril, crestor, metformin, escitalopram, asa, fish oil   Social History: Reviewed   Social History  Preferred Language: English ([place]) Race: White Ethnicity: Not Hispanic / Latino Age: 50 Years 1 Months Marital Status: M Alcohol: Yes, socially Recreational drug(s): No   Smoking Status: Current every day smoker reviewed on 08/16/2011 Started Date: 10/17/1980 Packs per day: 1.00   Family History: Reviewed  Family History  Is there a family history of:no family h/o colon cancer    Objective Information:  General: Well appearing, well nourished in no distress.  Skin:  Head: Atraumatic; no masses; no abnormalities  Neck: Supple without lymphadenopathy.  Heart: RRR, no murmur  Lungs:CTA bilaterally, no wheezes, rhonchi, rales. Breathing unlabored.  Abdomen: Soft, NT/ND, normal bowel sounds, no HSM, no masses. No peritoneal signs.  deferred to procedure  Assessment: Need for screening TCS  Diagnosis & Procedure: DiagnosisCode: V76.51, ProcedureCode: 04540,    Plan:Scheduled for TCS  on 09/06/11.   Patient Education: Alternative treatments to surgery were discussed with patient (and family).Risks and benefits of procedure were fully explained to the patient (and family) who gave informed consent. Patient/family questions were addressed.  Follow-up: Pending Surgery

## 2011-08-30 ENCOUNTER — Encounter (HOSPITAL_COMMUNITY): Payer: Self-pay | Admitting: Pharmacy Technician

## 2011-08-31 MED ORDER — BUPIVACAINE HCL (PF) 0.5 % IJ SOLN
INTRAMUSCULAR | Status: AC
  Filled 2011-08-31: qty 30

## 2011-08-31 MED ORDER — POVIDONE-IODINE 10 % EX OINT
TOPICAL_OINTMENT | CUTANEOUS | Status: AC
  Filled 2011-08-31: qty 2

## 2011-09-05 MED ORDER — SODIUM CHLORIDE 0.45 % IV SOLN
Freq: Once | INTRAVENOUS | Status: AC
  Administered 2011-09-06: 08:00:00 via INTRAVENOUS

## 2011-09-06 ENCOUNTER — Encounter (HOSPITAL_COMMUNITY)
Admission: RE | Disposition: A | Discharge status: Home / Self Care | Payer: Self-pay | Source: Ambulatory Visit | Attending: General Surgery

## 2011-09-06 ENCOUNTER — Encounter (HOSPITAL_COMMUNITY): Payer: Self-pay | Admitting: *Deleted

## 2011-09-06 ENCOUNTER — Ambulatory Visit (HOSPITAL_COMMUNITY)
Admission: RE | Admit: 2011-09-06 | Discharge: 2011-09-06 | Disposition: A | Discharge status: Home / Self Care | Payer: Managed Care, Other (non HMO) | Source: Ambulatory Visit | Attending: General Surgery | Admitting: General Surgery

## 2011-09-06 ENCOUNTER — Other Ambulatory Visit: Payer: Self-pay | Admitting: General Surgery

## 2011-09-06 DIAGNOSIS — Z01812 Encounter for preprocedural laboratory examination: Secondary | ICD-10-CM | POA: Insufficient documentation

## 2011-09-06 DIAGNOSIS — D126 Benign neoplasm of colon, unspecified: Secondary | ICD-10-CM | POA: Insufficient documentation

## 2011-09-06 DIAGNOSIS — Z1211 Encounter for screening for malignant neoplasm of colon: Secondary | ICD-10-CM | POA: Insufficient documentation

## 2011-09-06 HISTORY — DX: Essential (primary) hypertension: I10

## 2011-09-06 HISTORY — PX: COLONOSCOPY: SHX5424

## 2011-09-06 HISTORY — DX: Pure hypercholesterolemia, unspecified: E78.00

## 2011-09-06 HISTORY — DX: Anxiety disorder, unspecified: F41.9

## 2011-09-06 HISTORY — DX: Acute myocardial infarction, unspecified: I21.9

## 2011-09-06 LAB — GLUCOSE, CAPILLARY: Glucose-Capillary: 87 mg/dL (ref 70–99)

## 2011-09-06 SURGERY — COLONOSCOPY
Anesthesia: Moderate Sedation

## 2011-09-06 MED ORDER — MEPERIDINE HCL 25 MG/ML IJ SOLN
INTRAMUSCULAR | Status: DC | PRN
  Administered 2011-09-06: 50 mg via INTRAVENOUS

## 2011-09-06 MED ORDER — MEPERIDINE HCL 100 MG/ML IJ SOLN
INTRAMUSCULAR | Status: AC
  Filled 2011-09-06: qty 2

## 2011-09-06 MED ORDER — MIDAZOLAM HCL 5 MG/5ML IJ SOLN
INTRAMUSCULAR | Status: AC
  Filled 2011-09-06: qty 10

## 2011-09-06 MED ORDER — STERILE WATER FOR IRRIGATION IR SOLN
Status: DC | PRN
  Administered 2011-09-06: 09:00:00

## 2011-09-06 MED ORDER — MIDAZOLAM HCL 5 MG/5ML IJ SOLN
INTRAMUSCULAR | Status: DC | PRN
  Administered 2011-09-06: 3 mg via INTRAVENOUS

## 2011-09-06 NOTE — Interval H&P Note (Signed)
History and Physical Interval Note:   09/06/2011   8:27 AM   Nathan [name]  has presented today for surgery, with the diagnosis of Special screening for malignant neoplasms, colon [V76.51]  The various methods of treatment have been discussed with the patient and family. After consideration of risks, benefits and other options for treatment, the patient has consented to  Procedure(s): COLONOSCOPY as a surgical intervention .  The patients' history has been reviewed, patient examined, no change in status, stable for surgery.  I have reviewed the patients' chart and labs.  Questions were answered to the patient's satisfaction.     Dalia Heading  MD

## 2011-09-13 ENCOUNTER — Encounter (HOSPITAL_COMMUNITY): Payer: Self-pay | Admitting: General Surgery

## 2011-12-16 HISTORY — PX: TRANSTHORACIC ECHOCARDIOGRAM: SHX275

## 2013-05-18 ENCOUNTER — Other Ambulatory Visit: Payer: Self-pay | Admitting: Cardiovascular Disease

## 2013-05-18 LAB — COMPREHENSIVE METABOLIC PANEL
ALT: 43 U/L (ref 0–53)
AST: 26 U/L (ref 0–37)
Albumin: 4.3 g/dL (ref 3.5–5.2)
Alkaline Phosphatase: 63 U/L (ref 39–117)
BUN: 13 mg/dL (ref 6–23)
CO2: 31 mEq/L (ref 19–32)
Calcium: 9.5 mg/dL (ref 8.4–10.5)
Chloride: 100 mEq/L (ref 96–112)
Creat: 0.83 mg/dL (ref 0.50–1.35)
Glucose, Bld: 91 mg/dL (ref 70–99)
Potassium: 4.1 mEq/L (ref 3.5–5.3)
Sodium: 138 mEq/L (ref 135–145)
Total Bilirubin: 0.6 mg/dL (ref 0.3–1.2)
Total Protein: 6.7 g/dL (ref 6.0–8.3)

## 2013-05-18 LAB — CBC WITH DIFFERENTIAL/PLATELET
Basophils Absolute: 0.1 10*3/uL (ref 0.0–0.1)
Basophils Relative: 1 % (ref 0–1)
Eosinophils Absolute: 0.2 10*3/uL (ref 0.0–0.7)
Eosinophils Relative: 2 % (ref 0–5)
HCT: 43.3 % (ref 39.0–52.0)
Hemoglobin: 15.6 g/dL (ref 13.0–17.0)
Lymphocytes Relative: 29 % (ref 12–46)
Lymphs Abs: 2.5 10*3/uL (ref 0.7–4.0)
MCH: 31.6 pg (ref 26.0–34.0)
MCHC: 36 g/dL (ref 30.0–36.0)
MCV: 87.8 fL (ref 78.0–100.0)
Monocytes Absolute: 0.7 10*3/uL (ref 0.1–1.0)
Monocytes Relative: 8 % (ref 3–12)
Neutro Abs: 5.3 10*3/uL (ref 1.7–7.7)
Neutrophils Relative %: 60 % (ref 43–77)
Platelets: 270 10*3/uL (ref 150–400)
RBC: 4.93 MIL/uL (ref 4.22–5.81)
RDW: 12.9 % (ref 11.5–15.5)
WBC: 8.7 10*3/uL (ref 4.0–10.5)

## 2013-05-18 LAB — LIPID PANEL
Cholesterol: 119 mg/dL (ref 0–200)
HDL: 30 mg/dL — ABNORMAL LOW (ref >39)
LDL Cholesterol: 55 mg/dL (ref 0–99)
Total CHOL/HDL Ratio: 4 Ratio
Triglycerides: 171 mg/dL — ABNORMAL HIGH (ref <150)
VLDL: 34 mg/dL (ref 0–40)

## 2013-05-18 LAB — HEMOGLOBIN A1C
Hgb A1c MFr Bld: 6 % — ABNORMAL HIGH (ref <5.7)
Mean Plasma Glucose: 126 mg/dL — ABNORMAL HIGH (ref <117)

## 2013-05-18 LAB — TSH: TSH: 1.146 u[IU]/mL (ref 0.350–4.500)

## 2013-05-23 ENCOUNTER — Encounter: Payer: Self-pay | Admitting: Cardiovascular Disease

## 2013-10-14 ENCOUNTER — Other Ambulatory Visit: Payer: Self-pay | Admitting: *Deleted

## 2013-10-14 MED ORDER — ROSUVASTATIN CALCIUM 10 MG PO TABS: 10.0000 mg | ORAL_TABLET | Freq: Every day | ORAL | Status: DC

## 2013-10-14 NOTE — Telephone Encounter (Signed)
Rx was sent to pharmacy electronically. 

## 2013-11-13 ENCOUNTER — Encounter: Payer: Self-pay | Admitting: *Deleted

## 2013-11-20 ENCOUNTER — Ambulatory Visit (INDEPENDENT_AMBULATORY_CARE_PROVIDER_SITE_OTHER): Payer: BC Managed Care – PPO | Admitting: Internal Medicine

## 2013-11-20 ENCOUNTER — Encounter: Payer: Self-pay | Admitting: Internal Medicine

## 2013-11-20 VITALS — BP 130/80 | HR 52 | Ht 66.0 in | Wt 199.4 lb

## 2013-11-20 DIAGNOSIS — Z0289 Encounter for other administrative examinations: Secondary | ICD-10-CM

## 2013-11-20 DIAGNOSIS — I10 Essential (primary) hypertension: Secondary | ICD-10-CM

## 2013-11-20 DIAGNOSIS — F17200 Nicotine dependence, unspecified, uncomplicated: Secondary | ICD-10-CM

## 2013-11-20 DIAGNOSIS — E119 Type 2 diabetes mellitus without complications: Secondary | ICD-10-CM | POA: Insufficient documentation

## 2013-11-20 DIAGNOSIS — E785 Hyperlipidemia, unspecified: Secondary | ICD-10-CM | POA: Insufficient documentation

## 2013-11-20 DIAGNOSIS — I2510 Atherosclerotic heart disease of native coronary artery without angina pectoris: Secondary | ICD-10-CM | POA: Insufficient documentation

## 2013-11-20 DIAGNOSIS — Z720 Tobacco use: Secondary | ICD-10-CM | POA: Insufficient documentation

## 2013-11-20 NOTE — Progress Notes (Signed)
OFFICE NOTE  Chief Complaint:  DOT physical, no complaints  Primary Care Physician: Purvis Kilts, MD  HPI:  Nathan [name] is a pleasant 53 year old male with a history of inferior MI and stent to the mid RCA in 2005. Since then he has done well with no complaints. He also has a history of dyslipidemia, diabetes, hypertension and ongoing tobacco abuse. 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 Today he is here to get renewal of his DOT physical which she says is due in a few weeks. He also noted that on the form it required almost 2 weeks prior to submit all of the information for that physical. His last stress test was in 2012 and was negative for ischemia. 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  That being said, it would be worthwhile to repeat stress testing giving a high-risk occupation-as the DOT generally requires repeat stress testing every 2 years. He is also scheduled to see an occupational health physician this week.  PMHx:  Past Medical History  Diagnosis Date  . Diabetes mellitus   . Hypertension   . Hypercholesteremia   . Myocardial infarction 2005  . CAD (coronary artery disease)   . Anxiety     Past Surgical History  Procedure Laterality Date  . Cardiac catheterization  01/25/2004    Cypher DES (3.5x87mm) for PPMI to RCA (Dr. Corky Downs)  . Appendectomy    . Colonoscopy  09/06/2011    Procedure: COLONOSCOPY;  Surgeon: Jamesetta So;  Location: [facility];  Service: Gastroenterology;  Laterality: N/A;  . Cardiac catheterization  01/03/2005    patent stent with noncritical 30% Cfx disease and 30% LAD disease (Dr. Jackie Plum)  . Transthoracic echocardiogram  12/2011    EF=>55%, mild conc LVH; LA mildly dilated; trace MR & TR with normal RSVP; trace pulm valve regurg  . Nm myocar perf wall motion  11/2010    bruce myoview; normal  pattern of perfusion in all regions, post-stress EF 62%; normal, low risk scan with improved perfusion from previous study    FAMHx:  Family History  Problem Relation Age of Onset  . Colon cancer Neg Hx   . Cancer Mother   . Heart disease Mother   . Heart disease Maternal Grandmother   . Diabetes Paternal Grandmother   . Sudden death Paternal Grandmother     SOCHx:   reports that he has been smoking.  He has never used smokeless tobacco. He reports that he drinks alcohol. He reports that he does not use illicit drugs.  ALLERGIES:  No Known Allergies  ROS: A comprehensive review of systems was negative.  HOME MEDS: Current Outpatient Prescriptions  Medication Sig Dispense Refill  . acetaminophen (TYLENOL) 500 MG tablet Take 1,000 mg by mouth every 6 (six) hours as needed. For pain       . aspirin EC 81 MG tablet Take 81 mg by mouth daily.        . clopidogrel (PLAVIX) 75 MG tablet Take 75 mg by mouth daily.        Marland Kitchen escitalopram (LEXAPRO) 10 MG tablet Take 10 mg by mouth daily.        . hydrochlorothiazide (HYDRODIURIL) 25 MG tablet Take 25 mg by mouth daily.      . metFORMIN (GLUCOPHAGE) 500 MG tablet Take 500 mg  by mouth 2 (two) times daily.        . metoprolol (LOPRESSOR) 50 MG tablet Take 50 mg by mouth 2 (two) times daily.       . Omega-3 Fatty Acids (FISH OIL PO) Take 1 capsule by mouth 2 (two) times daily.      . ramipril (ALTACE) 10 MG capsule Take 10 mg by mouth daily.        . rosuvastatin (CRESTOR) 10 MG tablet Take 1 tablet (10 mg total) by mouth daily.  30 tablet  2   No current facility-administered medications for this visit.    LABS/IMAGING: No results found for this or any previous visit (from the past 48 hour(s)). No results found.  VITALS: BP 130/80  Pulse 52  Ht 5\' 6"  (1.676 m)  Wt 199 lb 6.4 oz (90.447 kg)  BMI 32.20 kg/m2  EXAM: General appearance: alert and no distress Neck: no carotid bruit and no JVD Lungs: clear to auscultation  bilaterally Heart: regular rate and rhythm, S1, S2 normal, no murmur, click, rub or gallop Abdomen: soft, non-tender; bowel sounds normal; no masses,  no organomegaly Extremities: extremities normal, atraumatic, no cyanosis or edema Pulses: 2+ and symmetric Skin: Skin color, texture, turgor normal. No rashes or lesions Neurologic: Grossly normal Psych: Mood, affect normal  EKG: Sinus bradycardia 52  ASSESSMENT: 1. Nathan [name] is status post DES to the RCA in 2005 2. Hypertension 3. Dyslipidemia 4. Diabetes type 2 5. Tobacco abuse 6. DOT physical exam  PLAN: 1.   Nathan [name] is doing fairly well, but is overdue for a stress test regarding DOT physical. I would recommend scheduling an exercise nuclear stress test.  I completed paperwork for his DOT physical and feel that he is at low risk at this point, however the stress test would be helpful to fully evaluate his cardiovascular risk. I did indicate that we could try to arrange that this week to me his deadlines for the DOT, but he reported that he is quite busy at work and may not be able to get to it soon. I understand that he is to see a occupational health specialist this week as well who may hopefully provide him an extension until we can complete his DOT required stress test.  Pixie Casino, MD, [facility] Attending Cardiologist [facility]  Nathan [name] 11/20/2013, 11:29 AM

## 2013-11-20 NOTE — Patient Instructions (Signed)
Your physician has requested that you have en exercise stress myoview. For further information please visit [email or web address]. Please follow instruction sheet, as given.  Your physician wants you to follow-up in: 1 year with Dr. Debara Pickett. You will receive a reminder letter in the mail two months in advance. If you don't receive a letter, please call our office to schedule the follow-up appointment.

## 2013-11-29 ENCOUNTER — Ambulatory Visit (HOSPITAL_COMMUNITY)
Admission: RE | Admit: 2013-11-29 | Discharge: 2013-11-29 | Disposition: A | Discharge status: Home / Self Care | Payer: BC Managed Care – PPO | Source: Ambulatory Visit | Attending: Internal Medicine | Admitting: Internal Medicine

## 2013-11-29 DIAGNOSIS — F17200 Nicotine dependence, unspecified, uncomplicated: Secondary | ICD-10-CM | POA: Insufficient documentation

## 2013-11-29 DIAGNOSIS — Z9861 Coronary angioplasty status: Secondary | ICD-10-CM | POA: Insufficient documentation

## 2013-11-29 DIAGNOSIS — I252 Old myocardial infarction: Secondary | ICD-10-CM | POA: Insufficient documentation

## 2013-11-29 DIAGNOSIS — Z0289 Encounter for other administrative examinations: Secondary | ICD-10-CM

## 2013-11-29 DIAGNOSIS — Z8249 Family history of ischemic heart disease and other diseases of the circulatory system: Secondary | ICD-10-CM | POA: Insufficient documentation

## 2013-11-29 DIAGNOSIS — I2510 Atherosclerotic heart disease of native coronary artery without angina pectoris: Secondary | ICD-10-CM

## 2013-11-29 DIAGNOSIS — E119 Type 2 diabetes mellitus without complications: Secondary | ICD-10-CM | POA: Insufficient documentation

## 2013-11-29 DIAGNOSIS — E785 Hyperlipidemia, unspecified: Secondary | ICD-10-CM | POA: Insufficient documentation

## 2013-11-29 DIAGNOSIS — R9431 Abnormal electrocardiogram [ECG] [EKG]: Secondary | ICD-10-CM | POA: Insufficient documentation

## 2013-11-29 DIAGNOSIS — I10 Essential (primary) hypertension: Secondary | ICD-10-CM | POA: Insufficient documentation

## 2013-11-29 DIAGNOSIS — E663 Overweight: Secondary | ICD-10-CM | POA: Insufficient documentation

## 2013-11-29 MED ORDER — TECHNETIUM TC 99M SESTAMIBI GENERIC - CARDIOLITE
10.5000 | Freq: Once | INTRAVENOUS | Status: AC | PRN
  Administered 2013-11-29: 11 via INTRAVENOUS

## 2013-11-29 MED ORDER — TECHNETIUM TC 99M SESTAMIBI GENERIC - CARDIOLITE
30.2000 | Freq: Once | INTRAVENOUS | Status: AC | PRN
  Administered 2013-11-29: 30.2 via INTRAVENOUS

## 2013-11-29 NOTE — Procedures (Addendum)
[facility] [address] [place] [address] [phone]  Cardiology Nuclear Med Study  Nathan [name] is a 53 y.o. male     MRN : [id number]     DOB: [birth year]  Procedure Date: 11/29/2013  Nuclear Med Background Indication for Stress Test:  Abnormal EKG History:  CAD;MI-2005;STENT/PTCA-2005 Cardiac Risk Factors: Family History - CAD, Hypertension, Lipids, NIDDM, Overweight and Smoker  Symptoms:  DOE   Nuclear Pre-Procedure Caffeine/Decaff Intake:  7:00pm NPO After: 5:00am   IV Site: R Forearm  IV 0.9% NS with Angio Cath:  22g  Chest Size (in):  44"  IV Started by: Azucena Cecil, RN  Height: 5\' 6"  (1.676 m)  Cup Size: n/a  BMI:  Body mass index is 32.13 kg/(m^2). Weight:  199 lb (90.266 kg)   Tech Comments:  N/a     Nuclear Med Study 1 or 2 day study: 1 day  Stress Test Type:  Stress  Order Authorizing Provider:  Kalman Jewels, MD   Resting Radionuclide: Technetium 26m Sestamibi  Resting Radionuclide Dose: 10.5 mCi   Stress Radionuclide:  Technetium 60m Sestamibi  Stress Radionuclide Dose: 30.2  mCi           Stress Protocol Rest HR: 68 Stress HR: 146  Rest BP: 121/85 Stress BP: 209/84  Exercise Time (min): 8 METS: 10.1   Predicted Max HR: 168 bpm % Max HR: 86.9 bpm Rate Pressure Product: 31244  Dose of Adenosine (mg):  n/a Dose of Lexiscan: n/a mg  Dose of Atropine (mg): n/a Dose of Dobutamine: n/a mcg/kg/min (at max HR)  Stress Test Technologist: Leane Para, CCT Nuclear Technologist: Imagene Riches, CNMT   Rest Procedure:  Myocardial perfusion imaging was performed at rest 45 minutes following the intravenous administration of Technetium 97m Sestamibi. Stress Procedure:  The patient performed treadmill exercise using a Bruce  Protocol for 8 minutes. The patient stopped due to SOB and denied any chest pain.  There were no significant ST-T wave changes.  Technetium 70m Sestamibi was injected at  peak exercise and myocardial perfusion imaging was performed after a brief delay.  Transient Ischemic Dilatation (Normal <1.22):  0.94 Lung/Heart Ratio (Normal <0.45):  0.33 QGS EDV:  67 ml QGS ESV:  19 ml LV Ejection Fraction: 72%  Rest ECG: NSR - Normal EKG  Stress ECG: No significant change from baseline ECG  QPS Raw Data Images:  Normal; no motion artifact; normal heart/lung ratio. Stress Images:  There is decreased uptake in the inferior wall. Rest Images:  There is decreased uptake in the inferior wall. Subtraction (SDS):  There is a fixed inferior defect that is most consistent with diaphragmatic attenuation.  Impression Exercise Capacity:  Good exercise capacity. BP Response:  Hypertensive blood pressure response. Clinical Symptoms:  There is dyspnea. ECG Impression:  No significant ST segment change suggestive of ischemia. Comparison with Prior Nuclear Study: No previous nuclear study performed  Overall Impression:  Low risk stress nuclear study with fixed inferobasal bowel attenuation artifact. No ischemia.  LV Wall Motion:  NL LV Function; NL Wall Motion; EF 72%.  Pixie Casino, MD, [facility] Board Certified in Nuclear Cardiology Attending Cardiologist [place], [place]  11/29/2013 12:47 PM

## 2013-12-16 ENCOUNTER — Telehealth: Payer: Self-pay | Admitting: Internal Medicine

## 2013-12-16 MED ORDER — CLOPIDOGREL BISULFATE 75 MG PO TABS: 75.0000 mg | ORAL_TABLET | Freq: Every day | ORAL | Status: DC

## 2013-12-16 NOTE — Telephone Encounter (Signed)
Returned call.  Left generic message that refill sent and to call back before 4pm if questions.  Was calling to find out if pt wanted 30 or 90 day supply.  Will send for 30.   Refill(s) sent to pharmacy.

## 2013-12-16 NOTE — Telephone Encounter (Signed)
Needs a new prescription sent to his local Pharm[facility] [place] ...Marland KitchenMarland KitchenClopidogrel tablets 75 mg take one by mouth daily . Marland KitchenMarland KitchenMarland KitchenHas not been able to get it thru the mail order as of yet   Thanks

## 2014-01-03 ENCOUNTER — Telehealth: Payer: Self-pay | Admitting: Internal Medicine

## 2014-01-03 MED ORDER — RAMIPRIL 10 MG PO CAPS: 10.0000 mg | ORAL_CAPSULE | Freq: Every day | ORAL | Status: DC

## 2014-01-03 NOTE — Telephone Encounter (Signed)
She she said she was sorry for giving [place] the wrong number,said she was confused. The correct number is [phone].

## 2014-01-03 NOTE — Telephone Encounter (Signed)
Wrong number for pharmacy.  Returned call.  Left message that message received w/ wrong number to pharmacy.  Also [facility] [place].

## 2014-01-03 NOTE — Telephone Encounter (Signed)
Changed pharmacy,he needs a new prescription. Please call his Ramipril  10mg  #30 to [phone].

## 2014-01-09 ENCOUNTER — Other Ambulatory Visit: Payer: Self-pay

## 2014-01-09 MED ORDER — ROSUVASTATIN CALCIUM 10 MG PO TABS: 10.0000 mg | ORAL_TABLET | Freq: Every day | ORAL | Status: DC

## 2014-01-09 NOTE — Telephone Encounter (Signed)
Rx was sent to pharmacy electronically. 

## 2014-05-07 ENCOUNTER — Telehealth: Payer: Self-pay | Admitting: Internal Medicine

## 2014-05-07 NOTE — Telephone Encounter (Signed)
Pt needs his Crestor,he wants to know if you have any discount card for Crestor?

## 2014-05-08 NOTE — Telephone Encounter (Signed)
Discount card left at front desk for patient pick-up. Patient notified.

## 2014-06-13 ENCOUNTER — Other Ambulatory Visit: Payer: Self-pay | Admitting: Internal Medicine

## 2014-06-13 NOTE — Telephone Encounter (Signed)
Rx was sent to pharmacy electronically. 

## 2014-06-24 ENCOUNTER — Other Ambulatory Visit: Payer: Self-pay | Admitting: Internal Medicine

## 2014-06-24 NOTE — Telephone Encounter (Signed)
Rx was sent to pharmacy electronically. 

## 2014-06-26 ENCOUNTER — Other Ambulatory Visit: Payer: Self-pay | Admitting: Internal Medicine

## 2014-06-26 NOTE — Telephone Encounter (Signed)
Rx refill denied, Rx was sent in 06/24/14

## 2014-10-06 ENCOUNTER — Ambulatory Visit (HOSPITAL_COMMUNITY)
Admission: RE | Admit: 2014-10-06 | Discharge: 2014-10-06 | Disposition: A | Discharge status: Home / Self Care | Payer: BC Managed Care – PPO | Source: Ambulatory Visit | Attending: Family Medicine | Admitting: Family Medicine

## 2014-10-06 ENCOUNTER — Other Ambulatory Visit (HOSPITAL_COMMUNITY): Payer: Self-pay | Admitting: Family Medicine

## 2014-10-06 DIAGNOSIS — M7702 Medial epicondylitis, left elbow: Secondary | ICD-10-CM | POA: Insufficient documentation

## 2014-10-06 DIAGNOSIS — M25522 Pain in left elbow: Secondary | ICD-10-CM | POA: Insufficient documentation

## 2014-10-06 DIAGNOSIS — M7712 Lateral epicondylitis, left elbow: Secondary | ICD-10-CM

## 2014-11-20 ENCOUNTER — Ambulatory Visit (INDEPENDENT_AMBULATORY_CARE_PROVIDER_SITE_OTHER): Payer: BLUE CROSS/BLUE SHIELD | Admitting: Internal Medicine

## 2014-11-20 ENCOUNTER — Encounter: Payer: Self-pay | Admitting: Internal Medicine

## 2014-11-20 VITALS — BP 138/82 | HR 61 | Ht 66.0 in | Wt 200.9 lb

## 2014-11-20 DIAGNOSIS — I2583 Coronary atherosclerosis due to lipid rich plaque: Principal | ICD-10-CM

## 2014-11-20 DIAGNOSIS — Z0289 Encounter for other administrative examinations: Secondary | ICD-10-CM

## 2014-11-20 DIAGNOSIS — I2510 Atherosclerotic heart disease of native coronary artery without angina pectoris: Secondary | ICD-10-CM

## 2014-11-20 DIAGNOSIS — E119 Type 2 diabetes mellitus without complications: Secondary | ICD-10-CM

## 2014-11-20 DIAGNOSIS — E785 Hyperlipidemia, unspecified: Secondary | ICD-10-CM

## 2014-11-20 DIAGNOSIS — I10 Essential (primary) hypertension: Secondary | ICD-10-CM

## 2014-11-20 NOTE — Patient Instructions (Signed)
Your physician wants you to follow-up in: 1 year with Dr. Hilty. You will receive a reminder letter in the mail two months in advance. If you don't receive a letter, please call our office to schedule the follow-up appointment.  

## 2014-11-20 NOTE — Progress Notes (Signed)
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 Since then he has done well with no complaints. He also has a history of dyslipidemia, diabetes, hypertension and ongoing tobacco abuse. 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 Today he is here to get renewal of his DOT physical which she says is due in a few weeks. He also noted that on the form it required almost 2 weeks prior to submit all of the information for that physical. His last stress test was in 2012 and was negative for ischemia. 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  That being said, it would be worthwhile to repeat stress testing giving a high-risk occupation-as the DOT generally requires repeat stress testing every 2 years. He is also scheduled to see an occupational health physician this week.  I saw Mr. Bulnes back in the office today. He is here for his annual DOT physical. He reports being well over the past year. Denies any chest pain or shortness of breath and is continued to be active. He wants and fishes and is generally doing physical work. He had a stress test last year which was negative for ischemia. He's had no syncope or presyncopal events.  PMHx:  Past Medical History  Diagnosis Date  . Diabetes mellitus   . Hypertension   . Hypercholesteremia   . Myocardial infarction 2005  . CAD (coronary artery disease)   . Anxiety     Past Surgical History  Procedure Laterality Date  . Cardiac catheterization  01/25/2004    Cypher DES (3.5x70mm) for PPMI to RCA (Dr. Corky Downs)  . Appendectomy    . Colonoscopy  09/06/2011    Procedure: COLONOSCOPY;  Surgeon: Jamesetta So;  Location: [facility];  Service:  Gastroenterology;  Laterality: N/A;  . Cardiac catheterization  01/03/2005    patent stent with noncritical 30% Cfx disease and 30% LAD disease (Dr. Jackie Plum)  . Transthoracic echocardiogram  12/2011    EF=>55%, mild conc LVH; LA mildly dilated; trace MR & TR with normal RSVP; trace pulm valve regurg  . Nm myocar perf wall motion  11/2010    bruce myoview; normal pattern of perfusion in all regions, post-stress EF 62%; normal, low risk scan with improved perfusion from previous study    FAMHx:  Family History  Problem Relation Age of Onset  . Colon cancer Neg Hx   . Cancer Mother   . Heart disease Mother   . Heart disease Maternal Grandmother   . Diabetes Paternal Grandmother   . Sudden death Paternal Grandmother     SOCHx:   reports that he has been smoking Cigarettes.  He has a 52.5 pack-year smoking history. He has never used smokeless tobacco. He reports that he drinks alcohol. He reports that he does not use illicit drugs.  ALLERGIES:  No Known Allergies  ROS: A comprehensive review of systems was negative.  HOME MEDS: Current Outpatient Prescriptions  Medication Sig Dispense Refill  . acetaminophen (TYLENOL) 500 MG tablet Take 1,000 mg by mouth every 6 (six) hours as needed. For pain     . aspirin EC 81 MG tablet Take 81  mg by mouth daily.      . clopidogrel (PLAVIX) 75 MG tablet TAKE 1 TABLET (75 MG TOTAL) BY MOUTH DAILY. 30 tablet 5  . escitalopram (LEXAPRO) 10 MG tablet Take 10 mg by mouth daily.      . hydrochlorothiazide (HYDRODIURIL) 25 MG tablet Take 25 mg by mouth daily.    . metFORMIN (GLUCOPHAGE) 500 MG tablet Take 500 mg by mouth 2 (two) times daily.      . metoprolol (LOPRESSOR) 50 MG tablet Take 50 mg by mouth 2 (two) times daily.     . Omega-3 Fatty Acids (FISH OIL PO) Take 1 capsule by mouth 2 (two) times daily.    . ramipril (ALTACE) 10 MG capsule TAKE ONE CAPSULE BY MOUTH EVERY DAY 30 capsule 7  . rosuvastatin (CRESTOR) 10 MG tablet Take 1 tablet (10 mg  total) by mouth daily. 30 tablet 11   No current facility-administered medications for this visit.    LABS/IMAGING: No results found for this or any previous visit (from the past 48 hour(s)). No results found.  VITALS: BP 138/82 mmHg  Pulse 61  Ht 5\' 6"  (1.676 m)  Wt 200 lb 14.4 oz (91.128 kg)  BMI 32.44 kg/m2  EXAM: General appearance: alert and no distress Neck: no carotid bruit and no JVD Lungs: clear to auscultation bilaterally Heart: regular rate and rhythm, S1, S2 normal, no murmur, click, rub or gallop Abdomen: soft, non-tender; bowel sounds normal; no masses,  no organomegaly Extremities: extremities normal, atraumatic, no cyanosis or edema Pulses: 2+ and symmetric Skin: Skin color, texture, turgor normal. No rashes or lesions Neurologic: Grossly normal Psych: Mood, affect normal  EKG: Normal sinus rhythm at 61  ASSESSMENT: 1. CAD status post DES to the RCA in 2005 - negative nuclear stress test in 2015 2. Hypertension 3. Dyslipidemia 4. Diabetes type 2 5. Tobacco abuse 6. DOT physical exam  PLAN: 1.   Mr. Rising is doing well. His blood pressure is well-controlled. His cholesterol is being followed by his primary care provider and is going to be checked later today. We will request results of this test. He continues to smoke and I counseled him on smoking cessation. He's had no further chest pain and had a negative nuclear stress test last year. I do not have any hesitation in approving him to return to driving per DOT guidelines. Present hesitate to contact me if you have any further questions.  Pixie Casino, MD, [facility] Attending Cardiologist [facility]  HILTY,Kenneth C 11/20/2014, 10:22 AM

## 2014-11-24 ENCOUNTER — Other Ambulatory Visit: Payer: Self-pay | Admitting: Internal Medicine

## 2014-11-24 NOTE — Telephone Encounter (Signed)
Rx(s) sent to pharmacy electronically.  

## 2014-11-26 ENCOUNTER — Encounter: Payer: Self-pay | Admitting: Internal Medicine

## 2015-01-30 ENCOUNTER — Other Ambulatory Visit: Payer: Self-pay | Admitting: Internal Medicine

## 2015-01-30 NOTE — Telephone Encounter (Signed)
E sent to pharmacy 

## 2015-09-21 ENCOUNTER — Telehealth: Payer: Self-pay | Admitting: Internal Medicine

## 2015-09-23 NOTE — Telephone Encounter (Signed)
Close encounter 

## 2015-10-26 ENCOUNTER — Ambulatory Visit (INDEPENDENT_AMBULATORY_CARE_PROVIDER_SITE_OTHER): Payer: BLUE CROSS/BLUE SHIELD | Admitting: Physician Assistant

## 2015-10-26 ENCOUNTER — Encounter: Payer: Self-pay | Admitting: Physician Assistant

## 2015-10-26 VITALS — BP 138/78 | HR 67 | Ht 66.0 in | Wt 203.0 lb

## 2015-10-26 DIAGNOSIS — E669 Obesity, unspecified: Secondary | ICD-10-CM

## 2015-10-26 DIAGNOSIS — E785 Hyperlipidemia, unspecified: Secondary | ICD-10-CM | POA: Diagnosis not present

## 2015-10-26 DIAGNOSIS — I2510 Atherosclerotic heart disease of native coronary artery without angina pectoris: Secondary | ICD-10-CM | POA: Diagnosis not present

## 2015-10-26 DIAGNOSIS — I10 Essential (primary) hypertension: Secondary | ICD-10-CM

## 2015-10-26 DIAGNOSIS — Z720 Tobacco use: Secondary | ICD-10-CM

## 2015-10-26 DIAGNOSIS — Z0289 Encounter for other administrative examinations: Secondary | ICD-10-CM

## 2015-10-26 DIAGNOSIS — I2583 Coronary atherosclerosis due to lipid rich plaque: Secondary | ICD-10-CM

## 2015-10-26 NOTE — Progress Notes (Signed)
Patient ID: [name], male   DOB: 05/27/1961, 55 y.o.   MRN: [id number]    Date:  10/26/2015   ID:  Nathan [name], DOB June 08, 1961, MRN [id number]  PCP:  Nathan Kilts, MD  Primary Cardiologist:  [facility]   Chief Complaint  Patient presents with  . Follow-up    DOT PHYS. Patient has no complaints.     History of Present Illness: Nathan [name] is a 55 y.o. 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  He also has a history of dyslipidemia, diabetes, hypertension and ongoing tobacco abuse. 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  He usually travels as far as 30 miles away and only occassionally stays overnight.   He also noted that on a form it required almost 2 weeks prior to submit all of the information for that physical. His last stress test was in 2015 and was negative for ischemia. 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 The DOT  requires repeat stress testing every 2 years.    Today he is here to get renewal of his DOT physical.  The patient has no complaints and reports doing well this last year.  He does however, continue to smoke.   He currently denies nausea, vomiting, fever, chest pain, shortness of breath, orthopnea, dizziness, PND, cough, congestion, abdominal pain, hematochezia, melena, lower extremity edema, claudication.  Wt Readings from Last 3 Encounters:  10/26/15 203 lb (92.08 kg)  11/20/14 200 lb 14.4 oz (91.128 kg)  11/29/13 199 lb (90.266 kg)     Past Medical History  Diagnosis Date  . Diabetes mellitus   . Hypertension   . Hypercholesteremia   . Myocardial infarction ([place]) 2005  . CAD (coronary artery disease)   . Anxiety     Current Outpatient Prescriptions  Medication Sig Dispense Refill  . acetaminophen (TYLENOL) 500 MG tablet Take 1,000 mg by mouth every 6 (six) hours as needed. For pain      . aspirin EC 81 MG tablet Take 81 mg by mouth daily.      . clopidogrel (PLAVIX) 75 MG tablet TAKE 1 TABLET (75 MG TOTAL) BY MOUTH DAILY. 30 tablet 11  . CRESTOR 10 MG tablet TAKE 1 TABLET BY MOUTH EVERY DAY 30 tablet 11  . escitalopram (LEXAPRO) 10 MG tablet Take 10 mg by mouth daily.      . hydrochlorothiazide (HYDRODIURIL) 25 MG tablet Take 25 mg by mouth daily.    . metFORMIN (GLUCOPHAGE) 500 MG tablet Take 500 mg by mouth 2 (two) times daily.      . metoprolol (LOPRESSOR) 50 MG tablet Take 50 mg by mouth 2 (two) times daily.     . Omega-3 Fatty Acids (FISH OIL PO) Take 1 capsule by mouth 2 (two) times daily.    . ramipril (ALTACE) 10 MG capsule TAKE ONE CAPSULE BY MOUTH EVERY DAY 30 capsule 11   No current facility-administered medications for this visit.    Allergies:   No Known Allergies  Social History:  The patient  reports that he has been smoking Cigarettes.  He has a 52.5 pack-year smoking history. He has never used smokeless tobacco. He reports that he drinks alcohol. He reports that he does not use illicit drugs.   Family history:   Family History  Problem  Relation Age of Onset  . Colon cancer Neg Hx   . Cancer Mother   . Heart disease Mother   . Heart disease Maternal Grandmother   . Diabetes Paternal Grandmother   . Sudden death Paternal Grandmother     ROS:  Please see the history of present illness.  All other systems reviewed and negative.   PHYSICAL EXAM: VS:  BP 138/78 mmHg  Pulse 67  Ht 5\' 6"  (1.676 m)  Wt 203 lb (92.08 kg)  BMI 32.78 kg/m2 Obese, well developed, in no acute distress HEENT: Pupils are equal round react to light accommodation extraocular movements are intact.  Neck: no JVDNo cervical lymphadenopathy. Cardiac: Regular rate and rhythm without murmurs rubs or gallops. Lungs:  clear to auscultation bilaterally, no wheezing, rhonchi or rales Abd: soft, nontender, positive bowel sounds all quadrants, no hepatosplenomegaly Ext: no lower  extremity edema.  2+ radial and dorsalis pedis pulses. Skin: warm and dry Neuro:  Grossly normal  EKG:  NSR 67 BPM  ASSESSMENT AND PLAN:  Problem List Items Addressed This Visit    Tobacco abuse   Obesity (BMI 30-39.9)   Essential hypertension - Primary   Relevant Orders   EKG 12-Lead   Myocardial Perfusion Imaging   Encounter for examination required by Department of Transportation (DOT)   Dyslipidemia   CAD (coronary artery disease)    Other Visit Diagnoses    Atherosclerosis of native coronary artery of native heart without angina pectoris        Relevant Orders    EKG 12-Lead    Myocardial Perfusion Imaging      It has been two years since his last stress test.  We will schedule a Treadmill Myoview.  He denies chest pain.  He continues to smoke and cessation was strongly encouraged.  We also discussed dietary changes that will make a positive change in his health.  Exercise was also encouraged.  His BP is mildly elevated but does not require med change at this time.    Continue ASA, plavix,metoprolol 50 BID, HCTZ 25, ramipril 10, crestor and fish oil.    I have signed his DOT form.  Nathan [name] has it.  Once I review his stress test results, we will mail it to [name] with a copy of the results.

## 2015-10-26 NOTE — Patient Instructions (Signed)
Schedule stress myoview   Your physician wants you to follow-up in: 1 year with Dr.Hilty for DOT physical. You will receive a reminder letter in the mail two months in advance. If you don't receive a letter, please call our office to schedule the follow-up appointment.

## 2015-10-29 ENCOUNTER — Telehealth (HOSPITAL_COMMUNITY): Payer: Self-pay

## 2015-10-29 ENCOUNTER — Telehealth (HOSPITAL_COMMUNITY): Payer: Self-pay | Admitting: *Deleted

## 2015-10-29 NOTE — Telephone Encounter (Signed)
Patient given detailed instructions per Myocardial Perfusion Study Information Sheet for the test on 11/02/15 at 745. Patient notified to arrive 15 minutes early and that it is imperative to arrive on time for appointment to keep from having the test rescheduled.  If you need to cancel or reschedule your appointment, please call the office within 24 hours of your appointment. Failure to do so may result in a cancellation of your appointment, and a $50 no show fee. Patient verbalized understanding.Hubbard Robinson, RN

## 2015-10-29 NOTE — Telephone Encounter (Signed)
Encounter complete. 

## 2015-11-02 ENCOUNTER — Ambulatory Visit (HOSPITAL_COMMUNITY): Payer: BLUE CROSS/BLUE SHIELD | Attending: Cardiovascular Disease

## 2015-11-02 DIAGNOSIS — I10 Essential (primary) hypertension: Secondary | ICD-10-CM | POA: Insufficient documentation

## 2015-11-02 DIAGNOSIS — F17200 Nicotine dependence, unspecified, uncomplicated: Secondary | ICD-10-CM | POA: Insufficient documentation

## 2015-11-02 DIAGNOSIS — E119 Type 2 diabetes mellitus without complications: Secondary | ICD-10-CM | POA: Insufficient documentation

## 2015-11-02 DIAGNOSIS — I2510 Atherosclerotic heart disease of native coronary artery without angina pectoris: Secondary | ICD-10-CM | POA: Diagnosis not present

## 2015-11-02 LAB — MYOCARDIAL PERFUSION IMAGING
Estimated workload: 9.7 METS
Exercise duration (min): 7 min
Exercise duration (sec): 45 s
LV dias vol: 97 mL
LV sys vol: 43 mL
MPHR: 166 {beats}/min
Peak HR: 150 {beats}/min
Percent HR: 90 %
RATE: 0.37
Rest HR: 55 {beats}/min
SDS: 0
SRS: 4
SSS: 4
TID: 1.06

## 2015-11-02 MED ORDER — TECHNETIUM TC 99M SESTAMIBI GENERIC - CARDIOLITE
30.4000 | Freq: Once | INTRAVENOUS | Status: AC | PRN
  Administered 2015-11-02: 30 via INTRAVENOUS

## 2015-11-02 MED ORDER — TECHNETIUM TC 99M SESTAMIBI GENERIC - CARDIOLITE
11.0000 | Freq: Once | INTRAVENOUS | Status: AC | PRN
  Administered 2015-11-02: 11 via INTRAVENOUS

## 2015-11-03 ENCOUNTER — Telehealth: Payer: Self-pay | Admitting: *Deleted

## 2015-11-03 NOTE — Telephone Encounter (Signed)
Called pt to inform him that his stress test was normal.  Pt verbalized appreciation and understanding.

## 2015-11-04 ENCOUNTER — Telehealth: Payer: Self-pay

## 2015-11-04 NOTE — Telephone Encounter (Signed)
Copy of Nathan [name] 10/26/15 office note,11/02/15 myoview mailed to patient for DOT.

## 2015-12-04 ENCOUNTER — Other Ambulatory Visit: Payer: Self-pay | Admitting: Internal Medicine

## 2015-12-04 NOTE — Telephone Encounter (Signed)
Rx refill sent to pharmacy. 

## 2016-02-01 ENCOUNTER — Other Ambulatory Visit: Payer: Self-pay | Admitting: Internal Medicine

## 2016-02-01 NOTE — Telephone Encounter (Signed)
Rx(s) sent to pharmacy electronically.  

## 2016-03-01 DIAGNOSIS — L821 Other seborrheic keratosis: Secondary | ICD-10-CM | POA: Diagnosis not present

## 2016-03-01 DIAGNOSIS — D485 Neoplasm of uncertain behavior of skin: Secondary | ICD-10-CM | POA: Diagnosis not present

## 2016-03-01 DIAGNOSIS — L570 Actinic keratosis: Secondary | ICD-10-CM | POA: Diagnosis not present

## 2016-03-29 DIAGNOSIS — C44319 Basal cell carcinoma of skin of other parts of face: Secondary | ICD-10-CM | POA: Diagnosis not present

## 2016-03-29 DIAGNOSIS — L570 Actinic keratosis: Secondary | ICD-10-CM | POA: Diagnosis not present

## 2016-03-29 DIAGNOSIS — D485 Neoplasm of uncertain behavior of skin: Secondary | ICD-10-CM | POA: Diagnosis not present

## 2016-09-02 DIAGNOSIS — I2510 Atherosclerotic heart disease of native coronary artery without angina pectoris: Secondary | ICD-10-CM | POA: Diagnosis not present

## 2016-09-02 DIAGNOSIS — Z6830 Body mass index (BMI) 30.0-30.9, adult: Secondary | ICD-10-CM | POA: Diagnosis not present

## 2016-09-02 DIAGNOSIS — E782 Mixed hyperlipidemia: Secondary | ICD-10-CM | POA: Diagnosis not present

## 2016-09-02 DIAGNOSIS — E119 Type 2 diabetes mellitus without complications: Secondary | ICD-10-CM | POA: Diagnosis not present

## 2016-09-02 DIAGNOSIS — I10 Essential (primary) hypertension: Secondary | ICD-10-CM | POA: Diagnosis not present

## 2016-09-02 DIAGNOSIS — Z1389 Encounter for screening for other disorder: Secondary | ICD-10-CM | POA: Diagnosis not present

## 2016-10-22 DIAGNOSIS — Z1389 Encounter for screening for other disorder: Secondary | ICD-10-CM | POA: Diagnosis not present

## 2016-10-22 DIAGNOSIS — E6609 Other obesity due to excess calories: Secondary | ICD-10-CM | POA: Diagnosis not present

## 2016-10-22 DIAGNOSIS — Z6830 Body mass index (BMI) 30.0-30.9, adult: Secondary | ICD-10-CM | POA: Diagnosis not present

## 2016-11-03 DIAGNOSIS — Z0001 Encounter for general adult medical examination with abnormal findings: Secondary | ICD-10-CM | POA: Diagnosis not present

## 2016-11-03 DIAGNOSIS — I2510 Atherosclerotic heart disease of native coronary artery without angina pectoris: Secondary | ICD-10-CM | POA: Diagnosis not present

## 2016-11-03 DIAGNOSIS — E782 Mixed hyperlipidemia: Secondary | ICD-10-CM | POA: Diagnosis not present

## 2016-11-03 DIAGNOSIS — Z6828 Body mass index (BMI) 28.0-28.9, adult: Secondary | ICD-10-CM | POA: Diagnosis not present

## 2016-11-03 DIAGNOSIS — E1165 Type 2 diabetes mellitus with hyperglycemia: Secondary | ICD-10-CM | POA: Diagnosis not present

## 2016-11-03 DIAGNOSIS — Z1389 Encounter for screening for other disorder: Secondary | ICD-10-CM | POA: Diagnosis not present

## 2016-11-07 ENCOUNTER — Encounter: Payer: Self-pay | Admitting: Internal Medicine

## 2016-11-07 ENCOUNTER — Other Ambulatory Visit: Payer: Self-pay | Admitting: Internal Medicine

## 2016-11-07 ENCOUNTER — Ambulatory Visit (INDEPENDENT_AMBULATORY_CARE_PROVIDER_SITE_OTHER): Payer: BLUE CROSS/BLUE SHIELD | Admitting: Internal Medicine

## 2016-11-07 VITALS — BP 110/72 | Ht 66.0 in | Wt 188.0 lb

## 2016-11-07 DIAGNOSIS — I10 Essential (primary) hypertension: Secondary | ICD-10-CM

## 2016-11-07 DIAGNOSIS — I2510 Atherosclerotic heart disease of native coronary artery without angina pectoris: Secondary | ICD-10-CM | POA: Diagnosis not present

## 2016-11-07 DIAGNOSIS — E119 Type 2 diabetes mellitus without complications: Secondary | ICD-10-CM

## 2016-11-07 DIAGNOSIS — Z0289 Encounter for other administrative examinations: Secondary | ICD-10-CM | POA: Diagnosis not present

## 2016-11-07 DIAGNOSIS — E785 Hyperlipidemia, unspecified: Secondary | ICD-10-CM

## 2016-11-07 MED ORDER — NITROGLYCERIN 0.4 MG SL SUBL: 0.4000 mg | SUBLINGUAL_TABLET | SUBLINGUAL | 2 refills | Status: DC | PRN

## 2016-11-07 NOTE — Progress Notes (Signed)
Patient ID: Nathan [name], male   DOB: 1960-12-03, 56 y.o.   MRN: [id number]    Date:  11/07/2016   ID:  Nathan [name], DOB [birth year], MRN [id number]  PCP:  Nathan Kilts, MD  Primary Cardiologist:  [facility]   Chief Complaint  Patient presents with  . Follow-up     History of Present Illness: Nathan [name] is a 56 y.o. 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  He also has a history of dyslipidemia, diabetes, hypertension and ongoing tobacco abuse. 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  He usually travels as far as 30 miles away and only occassionally stays overnight.   He also noted that on a form it required almost 2 weeks prior to submit all of the information for that physical. His last stress test was in 2015 and was negative for ischemia. 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 The DOT  requires repeat stress testing every 2 years.    Today he is here to get renewal of his DOT physical.  The patient has no complaints and reports doing well this last year.  He does however, continue to smoke.   He currently denies nausea, vomiting, fever, chest pain, shortness of breath, orthopnea, dizziness, PND, cough, congestion, abdominal pain, hematochezia, melena, lower extremity edema, claudication.  11/07/2016  Nathan [name] returns today for annual follow-up. This is also for cardiovascular evaluation for DOT physical. Last year he underwent a nuclear stress test which was negative for ischemia. Since last rate is had no new symptoms. 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 He denies any chest pain or worsening shortness of breath. EKG today shows sinus bradycardia at 55. Blood pressure is well controlled at 110/72. Recent lab work indicated that total cholesterol 113, tragus rate 68, HDL-C of 34 and LDL-C is 65, on moderate  dose of a high potency rosuvastatin 10 mg.  Wt Readings from Last 3 Encounters:  11/07/16 188 lb (85.3 kg)  11/02/15 203 lb (92.1 kg)  10/26/15 203 lb (92.1 kg)     Past Medical History:  Diagnosis Date  . Anxiety   . CAD (coronary artery disease)   . Diabetes mellitus   . Hypercholesteremia   . Hypertension   . Myocardial infarction 2005    Current Outpatient Prescriptions  Medication Sig Dispense Refill  . acetaminophen (TYLENOL) 500 MG tablet Take 1,000 mg by mouth every 6 (six) hours as needed. For pain     . aspirin EC 81 MG tablet Take 81 mg by mouth daily.      . clopidogrel (PLAVIX) 75 MG tablet TAKE 1 TABLET (75 MG TOTAL) BY MOUTH DAILY. 30 tablet 10  . escitalopram (LEXAPRO) 10 MG tablet Take 10 mg by mouth daily.      . hydrochlorothiazide (HYDRODIURIL) 25 MG tablet Take 25 mg by mouth daily.    . metFORMIN (GLUCOPHAGE) 500 MG tablet Take 500 mg by mouth 2 (two) times daily.      . metoprolol (LOPRESSOR) 50 MG tablet Take 50 mg by mouth 2 (two) times daily.     . Omega-3 Fatty Acids (FISH OIL PO) Take 1 capsule by mouth 2 (two) times daily.    . ramipril (ALTACE) 10 MG capsule TAKE ONE CAPSULE BY MOUTH EVERY DAY 30  capsule 11  . rosuvastatin (CRESTOR) 10 MG tablet TAKE 1 TABLET BY MOUTH EVERY DAY 30 tablet 9  . nitroGLYCERIN (NITROSTAT) 0.4 MG SL tablet Place 1 tablet (0.4 mg total) under the tongue every 5 (five) minutes as needed for chest pain. Max 3 dose 25 tablet 2   No current facility-administered medications for this visit.     Allergies:   No Known Allergies  Social History:  The patient  reports that he has been smoking Cigarettes.  He has a 52.50 pack-year smoking history. He has never used smokeless tobacco. He reports that he drinks alcohol. He reports that he does not use drugs.   Family history:   Family History  Problem Relation Age of Onset  . Colon cancer Neg Hx   . Cancer Mother   . Heart disease Mother   . Heart disease Maternal Grandmother    . Diabetes Paternal Grandmother   . Sudden death Paternal Grandmother     ROS:  Please see the history of present illness.  All other systems reviewed and negative.   PHYSICAL EXAM: VS:  BP 110/72 (BP Location: Right Arm, Patient Position: Sitting, Cuff Size: Normal)   Ht 5\' 6"  (1.676 m)   Wt 188 lb (85.3 kg)   SpO2 96%   BMI 30.34 kg/m  Obese, well developed, in no acute distress  HEENT: Pupils are equal round react to light accommodation extraocular movements are intact.  Neck: no JVD No cervical lymphadenopathy. Cardiac: Regular rate and rhythm without murmurs rubs or gallops.  Lungs:  clear to auscultation bilaterally, no wheezing, rhonchi or rales  Abd: soft, nontender, positive bowel sounds all quadrants, no hepatosplenomegaly  Ext: no lower extremity edema.  2+ radial and dorsalis pedis pulses. Skin: warm and dry  Neuro:  Grossly normal  EKG:  Sinus bradycardia 55  ASSESSMENT: 1. CAD with history of inferior MI status post PCI to the RCA in 2005 2. Hypertension-controlled 3. Dyslipidemia 4. Type 2 diabetes  PLAN: Nathan [name] is doing well without any recurrent chest pain symptoms. He is very well controlled with regards to his blood pressure, cholesterol and diabetes. His A1c was 5.7. EKG shows no ischemic changes. 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 I did complete his DOT physical from a cardiac standpoint. Follow-up with me in one year which time we'll repeat scheduled nuclear stress test per every 2 year requirements for DOT physicals.  Nathan Casino, MD, [facility] Attending Cardiologist [place]

## 2016-11-07 NOTE — Patient Instructions (Addendum)
Your physician has requested that you have an exercise stress myoview in ONE YEAR. For further information please visit [email or web address]. Please follow instruction sheet, as given.  Your physician wants you to follow-up in: ONE YEAR with Dr. Debara Pickett - after stress test. You will receive a reminder letter in the mail two months in advance. If you don't receive a letter, please call our office to schedule the follow-up appointment.  Dr. Debara Pickett has prescribed sublingual nitroglycerin to use as needed for chest pain   Dr. Debara Pickett has signed DOT form & given to patient/copy made for records

## 2016-12-05 ENCOUNTER — Other Ambulatory Visit: Payer: Self-pay | Admitting: Internal Medicine

## 2016-12-26 DIAGNOSIS — D0462 Carcinoma in situ of skin of left upper limb, including shoulder: Secondary | ICD-10-CM | POA: Diagnosis not present

## 2016-12-26 DIAGNOSIS — Z1283 Encounter for screening for malignant neoplasm of skin: Secondary | ICD-10-CM | POA: Diagnosis not present

## 2016-12-26 DIAGNOSIS — C4441 Basal cell carcinoma of skin of scalp and neck: Secondary | ICD-10-CM | POA: Diagnosis not present

## 2016-12-26 DIAGNOSIS — L570 Actinic keratosis: Secondary | ICD-10-CM | POA: Diagnosis not present

## 2016-12-26 DIAGNOSIS — X32XXXD Exposure to sunlight, subsequent encounter: Secondary | ICD-10-CM | POA: Diagnosis not present

## 2016-12-26 DIAGNOSIS — Z85828 Personal history of other malignant neoplasm of skin: Secondary | ICD-10-CM | POA: Diagnosis not present

## 2016-12-26 DIAGNOSIS — Z08 Encounter for follow-up examination after completed treatment for malignant neoplasm: Secondary | ICD-10-CM | POA: Diagnosis not present

## 2017-02-13 DIAGNOSIS — Z08 Encounter for follow-up examination after completed treatment for malignant neoplasm: Secondary | ICD-10-CM | POA: Diagnosis not present

## 2017-02-13 DIAGNOSIS — A630 Anogenital (venereal) warts: Secondary | ICD-10-CM | POA: Diagnosis not present

## 2017-02-13 DIAGNOSIS — C4441 Basal cell carcinoma of skin of scalp and neck: Secondary | ICD-10-CM | POA: Diagnosis not present

## 2017-02-13 DIAGNOSIS — Z85828 Personal history of other malignant neoplasm of skin: Secondary | ICD-10-CM | POA: Diagnosis not present

## 2017-03-30 DIAGNOSIS — E119 Type 2 diabetes mellitus without complications: Secondary | ICD-10-CM | POA: Diagnosis not present

## 2017-03-30 DIAGNOSIS — E6609 Other obesity due to excess calories: Secondary | ICD-10-CM | POA: Diagnosis not present

## 2017-03-30 DIAGNOSIS — I10 Essential (primary) hypertension: Secondary | ICD-10-CM | POA: Diagnosis not present

## 2017-03-30 DIAGNOSIS — Z6830 Body mass index (BMI) 30.0-30.9, adult: Secondary | ICD-10-CM | POA: Diagnosis not present

## 2017-03-30 DIAGNOSIS — E782 Mixed hyperlipidemia: Secondary | ICD-10-CM | POA: Diagnosis not present

## 2017-03-30 DIAGNOSIS — Z23 Encounter for immunization: Secondary | ICD-10-CM | POA: Diagnosis not present

## 2017-03-30 DIAGNOSIS — Z1389 Encounter for screening for other disorder: Secondary | ICD-10-CM | POA: Diagnosis not present

## 2017-05-19 DIAGNOSIS — Z6830 Body mass index (BMI) 30.0-30.9, adult: Secondary | ICD-10-CM | POA: Diagnosis not present

## 2017-05-19 DIAGNOSIS — Z1389 Encounter for screening for other disorder: Secondary | ICD-10-CM | POA: Diagnosis not present

## 2017-05-19 DIAGNOSIS — Z23 Encounter for immunization: Secondary | ICD-10-CM | POA: Diagnosis not present

## 2017-05-19 DIAGNOSIS — E6609 Other obesity due to excess calories: Secondary | ICD-10-CM | POA: Diagnosis not present

## 2017-05-23 ENCOUNTER — Telehealth: Payer: Self-pay | Admitting: Internal Medicine

## 2017-05-23 ENCOUNTER — Telehealth (HOSPITAL_COMMUNITY): Payer: Self-pay

## 2017-05-23 NOTE — Telephone Encounter (Signed)
05-22-17 Spoke w/pt.  He state his insurance is same policy that was previously listed in [place].  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  I spoke w/Ella R, Call Ref#[id number].  No future COBRA listed to take effect.  However if policy was in effect, no precert would be required for CPT 78452 for mbr.  Pt recontacted.  Pt states he will contact his corporate office. He was also waiting for one of his coworkers to call him who also was trying to contact their corporate office for same thing.  While on phone his coworker called and pt stated he would let me know outcome.  On 05-23-17 tried to contact pt but his mailbox was full.

## 2017-05-23 NOTE — Telephone Encounter (Signed)
Encounter complete. 

## 2017-05-24 ENCOUNTER — Telehealth (HOSPITAL_COMMUNITY): Payer: Self-pay

## 2017-05-24 NOTE — Telephone Encounter (Signed)
Encounter complete. 

## 2017-05-25 ENCOUNTER — Ambulatory Visit (HOSPITAL_COMMUNITY): Admission: RE | Admit: 2017-05-25 | Payer: Self-pay | Source: Ambulatory Visit

## 2017-05-26 ENCOUNTER — Telehealth (HOSPITAL_COMMUNITY): Payer: Self-pay

## 2017-05-26 NOTE — Telephone Encounter (Signed)
Encounter complete. 

## 2017-05-29 ENCOUNTER — Telehealth: Payer: Self-pay | Admitting: Internal Medicine

## 2017-05-29 NOTE — Telephone Encounter (Signed)
Pt's insurance, [employer], states pt can only have nuc stress test every 3 yrs if asymptomatic.  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  He agrees to cancel nuc test for tomorrow.  He will call DOT to see if he needs to renew since his employer has closed.  He will call me back to let me know the outcome.

## 2017-05-30 ENCOUNTER — Ambulatory Visit (HOSPITAL_COMMUNITY)
Admission: RE | Admit: 2017-05-30 | Payer: Self-pay | Source: Ambulatory Visit | Attending: Internal Medicine | Admitting: Internal Medicine

## 2017-06-01 ENCOUNTER — Telehealth: Payer: Self-pay | Admitting: Internal Medicine

## 2017-06-01 NOTE — Telephone Encounter (Signed)
Pt spoke w/DOT.  He is still good until 10/2017.  He would like a copy of his last OV and nuclear stress test.  He will come to our [place] office to pick them up this afternoon.

## 2017-06-12 ENCOUNTER — Ambulatory Visit: Payer: Self-pay | Admitting: Student

## 2017-06-13 ENCOUNTER — Ambulatory Visit: Payer: Self-pay | Admitting: Internal Medicine

## 2017-10-05 ENCOUNTER — Other Ambulatory Visit: Payer: Self-pay | Admitting: Internal Medicine

## 2017-10-23 ENCOUNTER — Encounter: Payer: Self-pay | Admitting: Internal Medicine

## 2017-10-23 ENCOUNTER — Ambulatory Visit (INDEPENDENT_AMBULATORY_CARE_PROVIDER_SITE_OTHER): Payer: BLUE CROSS/BLUE SHIELD | Admitting: Internal Medicine

## 2017-10-23 VITALS — BP 138/81 | HR 50 | Ht 66.0 in | Wt 186.2 lb

## 2017-10-23 DIAGNOSIS — E785 Hyperlipidemia, unspecified: Secondary | ICD-10-CM | POA: Diagnosis not present

## 2017-10-23 DIAGNOSIS — I10 Essential (primary) hypertension: Secondary | ICD-10-CM | POA: Diagnosis not present

## 2017-10-23 DIAGNOSIS — I2510 Atherosclerotic heart disease of native coronary artery without angina pectoris: Secondary | ICD-10-CM

## 2017-10-23 DIAGNOSIS — Z0289 Encounter for other administrative examinations: Secondary | ICD-10-CM

## 2017-10-23 DIAGNOSIS — E119 Type 2 diabetes mellitus without complications: Secondary | ICD-10-CM

## 2017-10-23 NOTE — Patient Instructions (Signed)
Your physician has requested that you have en exercise stress myoview in July. For further information please visit [email or web address]. Please follow instruction sheet, as given.  Your physician wants you to follow-up in July 2019 with Dr. Debara Pickett (after stress test). You will receive a reminder letter in the mail two months in advance. If you don't receive a letter, please call our office to schedule the follow-up appointment.

## 2017-10-23 NOTE — Progress Notes (Signed)
Patient ID: Nathan [name], male   DOB: February 06, 1961, 57 y.o.   MRN: [id number]    Date:  10/23/2017   ID:  Nathan [name], DOB [birth year], MRN [id number]  PCP:  Sharilyn Sites, MD  Primary Cardiologist:  [facility]   Chief Complaint  Patient presents with  . Follow-up     History of Present Illness: Nathan [name] is a 57 y.o. 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  He also has a history of dyslipidemia, diabetes, hypertension and ongoing tobacco abuse. 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  He usually travels as far as 30 miles away and only occassionally stays overnight.   He also noted that on a form it required almost 2 weeks prior to submit all of the information for that physical. His last stress test was in 2015 and was negative for ischemia. 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 The DOT  requires repeat stress testing every 2 years.     Today he is here to get renewal of his DOT physical.  The patient has no complaints and reports doing well this last year.  He does however, continue to smoke.   He currently denies nausea, vomiting, fever, chest pain, shortness of breath, orthopnea, dizziness, PND, cough, congestion, abdominal pain, hematochezia, melena, lower extremity edema, claudication.  11/07/2016  Nathan [name] returns today for annual follow-up. This is also for cardiovascular evaluation for DOT physical. Last year he underwent a nuclear stress test which was negative for ischemia. Since last rate is had no new symptoms. 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 He denies any chest pain or worsening shortness of breath. EKG today shows sinus bradycardia at 55. Blood pressure is well controlled at 110/72. Recent lab work indicated that total cholesterol 113, tragus rate 68, HDL-C of 34 and LDL-C is 65, on moderate dose  of a high potency rosuvastatin 10 mg.  10/23/2017  Nathan [name] was seen today for annual follow-up.  He is doing well from a cardiac standpoint denies any chest pain or worsening shortness of breath.  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  He said he was renewed through August and would likely need a stress test at that time.  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  His weight had come down over the past couple years to his current rate around 186 pounds.  Most of these changes were due to mild diabetes which is followed by his primary care provider.  His lipid profile will be reassessed next month and will obtain a copy of that.  He will need a repeat stress test per DOT guidelines in July.  Wt Readings from Last 3 Encounters:  10/23/17 186 lb 3.2 oz (84.5 kg)  11/07/16 188 lb (85.3 kg)  11/02/15 203 lb (92.1 kg)     Past Medical History:  Diagnosis Date  . Anxiety   . CAD (coronary artery disease)   . Diabetes mellitus   . Hypercholesteremia   . Hypertension   . Myocardial infarction [facility]) 2005    Current Outpatient Medications  Medication Sig Dispense Refill  . acetaminophen (TYLENOL) 500 MG tablet Take 1,000 mg by mouth every 6 (six) hours as needed. For pain     .  aspirin EC 81 MG tablet Take 81 mg by mouth daily.      . clopidogrel (PLAVIX) 75 MG tablet TAKE 1 TABLET (75 MG TOTAL) BY MOUTH DAILY. 90 tablet 0  . escitalopram (LEXAPRO) 10 MG tablet Take 10 mg by mouth daily.      . hydrochlorothiazide (HYDRODIURIL) 25 MG tablet Take 25 mg by mouth daily.    . metFORMIN (GLUCOPHAGE) 500 MG tablet Take 500 mg by mouth 2 (two) times daily.      . metoprolol (LOPRESSOR) 50 MG tablet Take 50 mg by mouth 2 (two) times daily.     . Omega-3 Fatty Acids (FISH OIL PO) Take 1 capsule by mouth 2 (two) times daily.    . ramipril (ALTACE) 10 MG capsule TAKE ONE CAPSULE BY MOUTH EVERY DAY 30 capsule 11  . rosuvastatin (CRESTOR) 10 MG tablet TAKE 1 TABLET BY  MOUTH EVERY DAY 30 tablet 9  . nitroGLYCERIN (NITROSTAT) 0.4 MG SL tablet Place 1 tablet (0.4 mg total) under the tongue every 5 (five) minutes as needed for chest pain. Max 3 dose 25 tablet 2   No current facility-administered medications for this visit.     Allergies:   No Known Allergies  Social History:  The patient  reports that he has been smoking cigarettes.  He has a 52.50 pack-year smoking history. he has never used smokeless tobacco. He reports that he drinks alcohol. He reports that he does not use drugs.   Family history:   Family History  Problem Relation Age of Onset  . Cancer Mother   . Heart disease Mother   . Heart disease Maternal Grandmother   . Diabetes Paternal Grandmother   . Sudden death Paternal Grandmother   . Colon cancer Neg Hx     ROS:  Please see the history of present illness.  All other systems reviewed and negative.   PHYSICAL EXAM: 1. VS:  BP 138/81   Pulse (!) 50   Ht 5\' 6"  (1.676 m)   Wt 186 lb 3.2 oz (84.5 kg)   BMI 30.05 kg/m  General appearance: alert and no distress Neck: no carotid bruit, no JVD and thyroid not enlarged, symmetric, no tenderness/mass/nodules Lungs: clear to auscultation bilaterally Heart: regular rate and rhythm, S1, S2 normal, no murmur, click, rub or gallop Abdomen: soft, non-tender; bowel sounds normal; no masses,  no organomegaly Extremities: extremities normal, atraumatic, no cyanosis or edema Pulses: 2+ and symmetric Skin: Skin color, texture, turgor normal. No rashes or lesions Neurologic: Grossly normal Psych: Pleasant  EKG:  Sinus bradycardia 56-personally reviewed  ASSESSMENT: 1. CAD with history of inferior MI status post PCI to the RCA in 2005 2. Hypertension-controlled 3. Dyslipidemia 4. Type 2 diabetes 5. Encounter for DOT physical  PLAN: Nathan [name] has had no further anginal symptoms secondary to inferior MI with a stent to the RCA in 2005.  He is maintained to decrease weight which is about  20 pounds less than he was in 2017.  Blood pressures well controlled.  His cholesterol is due for reassessment for his primary care provider.  We will obtain his labs next month.  He is also requesting a repeat stress test for DOT physical in July.  He switched jobs and is good until then.  We will schedule an exercise Myoview and plan to see [name] back then.  Pixie Casino, MD, [facility], [place] Director of the Advanced Lipid Disorders &  Cardiovascular Risk  Reduction Clinic Diplomate of the American Board of Clinical Lipidology Attending Cardiologist  Direct Dial: [phone]  Fax: [phone]  Website:  [email or web address]

## 2017-10-24 ENCOUNTER — Other Ambulatory Visit: Payer: Self-pay | Admitting: Internal Medicine

## 2017-10-24 MED ORDER — ROSUVASTATIN CALCIUM 10 MG PO TABS: 10.0000 mg | ORAL_TABLET | Freq: Every day | ORAL | 3 refills | Status: DC

## 2017-10-24 NOTE — Telephone Encounter (Signed)
Rx has been sent to the pharmacy electronically. ° °

## 2017-10-24 NOTE — Telephone Encounter (Signed)
°*  STAT* If patient is at the pharmacy, call can be transferred to refill team.   1. Which medications need to be refilled? (please list name of each medication and dose if known) Rosuvastatin  2. Which pharmacy/location (including street and city if local pharmacy) is medication to be sent to?[id number]  3. Do they need a 30 day or 90 day supply? 90 and refills

## 2017-10-24 NOTE — Telephone Encounter (Signed)
°*  STAT* If patient is at the pharmacy, call can be transferred to refill team.   1. Which medications need to be refilled? (please list name of each medication and dose if known) Clopidgrel 75 mg ( needs a prescription renewal)  2. Which pharmacy/location (including street and city if local pharmacy) is medication [facility] be [facility] to?[facility] [address] [facility] [place]   3. Do they need a 30 day or 90 day supply? [place]

## 2017-11-20 DIAGNOSIS — A630 Anogenital (venereal) warts: Secondary | ICD-10-CM | POA: Diagnosis not present

## 2017-11-20 DIAGNOSIS — X32XXXA Exposure to sunlight, initial encounter: Secondary | ICD-10-CM | POA: Diagnosis not present

## 2017-11-20 DIAGNOSIS — Z1283 Encounter for screening for malignant neoplasm of skin: Secondary | ICD-10-CM | POA: Diagnosis not present

## 2017-11-20 DIAGNOSIS — Z85828 Personal history of other malignant neoplasm of skin: Secondary | ICD-10-CM | POA: Diagnosis not present

## 2017-11-20 DIAGNOSIS — L280 Lichen simplex chronicus: Secondary | ICD-10-CM | POA: Diagnosis not present

## 2017-11-20 DIAGNOSIS — L570 Actinic keratosis: Secondary | ICD-10-CM | POA: Diagnosis not present

## 2017-11-20 DIAGNOSIS — Z08 Encounter for follow-up examination after completed treatment for malignant neoplasm: Secondary | ICD-10-CM | POA: Diagnosis not present

## 2017-11-21 DIAGNOSIS — I10 Essential (primary) hypertension: Secondary | ICD-10-CM | POA: Diagnosis not present

## 2017-11-21 DIAGNOSIS — I2510 Atherosclerotic heart disease of native coronary artery without angina pectoris: Secondary | ICD-10-CM | POA: Diagnosis not present

## 2017-11-21 DIAGNOSIS — G5603 Carpal tunnel syndrome, bilateral upper limbs: Secondary | ICD-10-CM | POA: Diagnosis not present

## 2017-11-21 DIAGNOSIS — E119 Type 2 diabetes mellitus without complications: Secondary | ICD-10-CM | POA: Diagnosis not present

## 2017-11-21 DIAGNOSIS — Z6829 Body mass index (BMI) 29.0-29.9, adult: Secondary | ICD-10-CM | POA: Diagnosis not present

## 2017-11-21 DIAGNOSIS — E782 Mixed hyperlipidemia: Secondary | ICD-10-CM | POA: Diagnosis not present

## 2017-11-21 DIAGNOSIS — Z1389 Encounter for screening for other disorder: Secondary | ICD-10-CM | POA: Diagnosis not present

## 2017-11-21 DIAGNOSIS — K1370 Unspecified lesions of oral mucosa: Secondary | ICD-10-CM | POA: Diagnosis not present

## 2017-11-21 DIAGNOSIS — E663 Overweight: Secondary | ICD-10-CM | POA: Diagnosis not present

## 2017-11-21 DIAGNOSIS — Z0001 Encounter for general adult medical examination with abnormal findings: Secondary | ICD-10-CM | POA: Diagnosis not present

## 2018-01-01 DIAGNOSIS — K1123 Chronic sialoadenitis: Secondary | ICD-10-CM | POA: Diagnosis not present

## 2018-01-02 DIAGNOSIS — K1123 Chronic sialoadenitis: Secondary | ICD-10-CM | POA: Diagnosis not present

## 2018-01-18 ENCOUNTER — Other Ambulatory Visit: Payer: Self-pay | Admitting: Internal Medicine

## 2018-05-01 ENCOUNTER — Encounter (HOSPITAL_COMMUNITY): Payer: BLUE CROSS/BLUE SHIELD

## 2018-05-01 ENCOUNTER — Encounter: Payer: Self-pay | Admitting: Internal Medicine

## 2018-05-03 ENCOUNTER — Telehealth (HOSPITAL_COMMUNITY): Payer: Self-pay

## 2018-05-03 NOTE — Telephone Encounter (Signed)
Encounter complete. 

## 2018-05-07 DIAGNOSIS — E119 Type 2 diabetes mellitus without complications: Secondary | ICD-10-CM | POA: Diagnosis not present

## 2018-05-07 DIAGNOSIS — I2510 Atherosclerotic heart disease of native coronary artery without angina pectoris: Secondary | ICD-10-CM | POA: Diagnosis not present

## 2018-05-07 DIAGNOSIS — I10 Essential (primary) hypertension: Secondary | ICD-10-CM | POA: Diagnosis not present

## 2018-05-07 DIAGNOSIS — Z1389 Encounter for screening for other disorder: Secondary | ICD-10-CM | POA: Diagnosis not present

## 2018-05-07 DIAGNOSIS — E782 Mixed hyperlipidemia: Secondary | ICD-10-CM | POA: Diagnosis not present

## 2018-05-07 DIAGNOSIS — Z720 Tobacco use: Secondary | ICD-10-CM | POA: Diagnosis not present

## 2018-05-07 DIAGNOSIS — Z6828 Body mass index (BMI) 28.0-28.9, adult: Secondary | ICD-10-CM | POA: Diagnosis not present

## 2018-05-07 DIAGNOSIS — E663 Overweight: Secondary | ICD-10-CM | POA: Diagnosis not present

## 2018-05-08 ENCOUNTER — Ambulatory Visit (HOSPITAL_COMMUNITY)
Admission: RE | Admit: 2018-05-08 | Discharge: 2018-05-08 | Disposition: A | Discharge status: Home / Self Care | Payer: BLUE CROSS/BLUE SHIELD | Source: Ambulatory Visit | Attending: Internal Medicine | Admitting: Internal Medicine

## 2018-05-08 DIAGNOSIS — I2510 Atherosclerotic heart disease of native coronary artery without angina pectoris: Secondary | ICD-10-CM | POA: Diagnosis not present

## 2018-05-08 DIAGNOSIS — Z0289 Encounter for other administrative examinations: Secondary | ICD-10-CM | POA: Insufficient documentation

## 2018-05-08 LAB — MYOCARDIAL PERFUSION IMAGING
Estimated workload: 10.4 METS
Exercise duration (min): 9 min
Exercise duration (sec): 1 s
LV dias vol: 94 mL (ref 62–150)
LV sys vol: 38 mL
MPHR: 164 {beats}/min
Peak HR: 144 {beats}/min
Percent HR: 87 %
RPE: 19
Rest HR: 54 {beats}/min
SDS: 0
SRS: 0
SSS: 0
TID: 1.06

## 2018-05-08 MED ORDER — TECHNETIUM TC 99M TETROFOSMIN IV KIT
29.0000 | PACK | Freq: Once | INTRAVENOUS | Status: AC | PRN
  Administered 2018-05-08: 29 via INTRAVENOUS
  Filled 2018-05-08: qty 29

## 2018-05-08 MED ORDER — TECHNETIUM TC 99M TETROFOSMIN IV KIT
9.3000 | PACK | Freq: Once | INTRAVENOUS | Status: AC | PRN
  Administered 2018-05-08: 9.3 via INTRAVENOUS
  Filled 2018-05-08: qty 10

## 2018-05-23 ENCOUNTER — Ambulatory Visit: Payer: BLUE CROSS/BLUE SHIELD | Admitting: Internal Medicine

## 2018-07-02 ENCOUNTER — Encounter: Payer: Self-pay | Admitting: Internal Medicine

## 2018-07-02 ENCOUNTER — Ambulatory Visit: Payer: BLUE CROSS/BLUE SHIELD | Admitting: Internal Medicine

## 2018-07-02 VITALS — BP 118/72 | HR 55 | Ht 66.0 in | Wt 185.6 lb

## 2018-07-02 DIAGNOSIS — E785 Hyperlipidemia, unspecified: Secondary | ICD-10-CM | POA: Diagnosis not present

## 2018-07-02 DIAGNOSIS — E119 Type 2 diabetes mellitus without complications: Secondary | ICD-10-CM

## 2018-07-02 DIAGNOSIS — Z0289 Encounter for other administrative examinations: Secondary | ICD-10-CM | POA: Diagnosis not present

## 2018-07-02 DIAGNOSIS — I2510 Atherosclerotic heart disease of native coronary artery without angina pectoris: Secondary | ICD-10-CM

## 2018-07-02 DIAGNOSIS — I10 Essential (primary) hypertension: Secondary | ICD-10-CM

## 2018-07-02 NOTE — Patient Instructions (Signed)
Your physician wants you to follow-up in: ONE YEAR with Dr. Hilty. You will receive a reminder letter in the mail two months in advance. If you don't receive a letter, please call our office to schedule the follow-up appointment.  

## 2018-07-02 NOTE — Progress Notes (Signed)
Date:  07/02/2018   ID:  Nathan [name], DOB Aug 01, 1961, MRN [id number]  PCP:  Sharilyn Sites, MD  Primary Cardiologist:  Debara Pickett   CC: No complaints   History of Present Illness: Nathan [name] is a 57 y.o. 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  He also has a history of dyslipidemia, diabetes, hypertension and ongoing tobacco abuse. 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  He usually travels as far as 30 miles away and only occassionally stays overnight.   He also noted that on a form it required almost 2 weeks prior to submit all of the information for that physical. His last stress test was in 2015 and was negative for ischemia. 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 The DOT  requires repeat stress testing every 2 years.     Today he is here to get renewal of his DOT physical.  The patient has no complaints and reports doing well this last year.  He does however, continue to smoke.   He currently denies nausea, vomiting, fever, chest pain, shortness of breath, orthopnea, dizziness, PND, cough, congestion, abdominal pain, hematochezia, melena, lower extremity edema, claudication.  11/07/2016  [name] returns today for annual follow-up. This is also for cardiovascular evaluation for DOT physical. Last year he underwent a nuclear stress test which was negative for ischemia. Since last rate is had no new symptoms. 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 He denies any chest pain or worsening shortness of breath. EKG today shows sinus bradycardia at 55. Blood pressure is well controlled at 110/72. Recent lab work indicated that total cholesterol 113, tragus rate 68, HDL-C of 34 and LDL-C is 65, on moderate dose of a high potency rosuvastatin 10 mg.  10/23/2017  [name] was seen today for annual follow-up.  He is doing  well from a cardiac standpoint denies any chest pain or worsening shortness of breath.  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  He said he was renewed through August and would likely need a stress test at that time.  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  His weight had come down over the past couple years to his current rate around 186 pounds.  Most of these changes were due to mild diabetes which is followed by his primary care provider.  His lipid profile will be reassessed next month and will obtain a copy of that.  He will need a repeat stress test per DOT guidelines in July.  07/02/2018  [name] was seen today for follow-up.  He recently underwent stress testing in July for his DOT physical.  This was low risk with normal LV function.  He has had no new chest pain or worsening shortness of breath.  Blood pressure is well controlled is a repeat today came down to 118/72.  He is hemoglobin A1c is controlled at 6.0.  Cholesterol is at goal with LDL less than 70.  He remains physically active.  Wt Readings from Last 3 Encounters:  07/02/18 185 lb 9.6 oz (84.2 kg)  05/08/18 186 lb (84.4 kg)  10/23/17 186 lb 3.2 oz (84.5 kg)     Past Medical History:  Diagnosis Date  . Anxiety   .  CAD (coronary artery disease)   . Diabetes mellitus   . Hypercholesteremia   . Hypertension   . Myocardial infarction [facility]) 2005    Current Outpatient Medications  Medication Sig Dispense Refill  . acetaminophen (TYLENOL) 500 MG tablet Take 1,000 mg by mouth every 6 (six) hours as needed. For pain     . aspirin EC 81 MG tablet Take 81 mg by mouth daily.      . clopidogrel (PLAVIX) 75 MG tablet TAKE 1 TABLET BY MOUTH EVERY DAY 90 tablet 3  . escitalopram (LEXAPRO) 10 MG tablet Take 10 mg by mouth daily.      . hydrochlorothiazide (HYDRODIURIL) 25 MG tablet Take 25 mg by mouth daily.    . metFORMIN (GLUCOPHAGE) 500 MG tablet Take 500 mg by mouth 2 (two) times daily.      .  metoprolol (LOPRESSOR) 50 MG tablet Take 50 mg by mouth 2 (two) times daily.     . Omega-3 Fatty Acids (FISH OIL PO) Take 1 capsule by mouth 2 (two) times daily.    . ramipril (ALTACE) 10 MG capsule TAKE ONE CAPSULE BY MOUTH EVERY DAY 30 capsule 11  . rosuvastatin (CRESTOR) 10 MG tablet Take 1 tablet (10 mg total) by mouth daily. 90 tablet 3  . nitroGLYCERIN (NITROSTAT) 0.4 MG SL tablet Place 1 tablet (0.4 mg total) under the tongue every 5 (five) minutes as needed for chest pain. Max 3 dose 25 tablet 2   No current facility-administered medications for this visit.     Allergies:   No Known Allergies  Social History:  The patient  reports that he has been smoking cigarettes. He has a 52.50 pack-year smoking history. He has never used smokeless tobacco. He reports that he drinks alcohol. He reports that he does not use drugs.   Family history:   Family History  Problem Relation Age of Onset  . Cancer Mother   . Heart disease Mother   . Heart disease Maternal Grandmother   . Diabetes Paternal Grandmother   . Sudden death Paternal Grandmother   . Colon cancer Neg Hx     ROS: Pertinent items noted in HPI and remainder of comprehensive ROS otherwise negative.   PHYSICAL EXAM: 1. VS:  BP (!) 142/98 (BP Location: Left Arm, Patient Position: Sitting, Cuff Size: Normal)   Pulse (!) 55   Ht 5\' 6"  (1.676 m)   Wt 185 lb 9.6 oz (84.2 kg)   BMI 29.96 kg/m  General appearance: alert and no distress Neck: no carotid bruit, no JVD and thyroid not enlarged, symmetric, no tenderness/mass/nodules Lungs: clear to auscultation bilaterally Heart: regular rate and rhythm, S1, S2 normal, no murmur, click, rub or gallop Abdomen: soft, non-tender; bowel sounds normal; no masses,  no organomegaly Extremities: extremities normal, atraumatic, no cyanosis or edema Pulses: 2+ and symmetric Skin: Skin color, texture, turgor normal. No rashes or lesions Neurologic: Grossly normal Psych: Pleasant  EKG:    Deferred  ASSESSMENT: 1. CAD with history of inferior MI status post PCI to the RCA in 2005 2. Hypertension-controlled 3. Dyslipidemia 4. Type 2 diabetes 5. Encounter for DOT physical  PLAN: Mr. Croom had a low risk stress test in July and has had no clinical events since his stent in 2005.  Blood pressures well controlled.  Diabetes and dyslipidemia are goal.  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  We will plan to see [name] back annually or sooner as necessary.  Pixie Casino,  MD, FACC, [place] Director of the Advanced Lipid Disorders &  Cardiovascular Risk Reduction Clinic Diplomate of the American Board of Clinical Lipidology Attending Cardiologist  Direct Dial: [phone]  Fax: [phone]  Website:  [email or web address]

## 2018-08-20 ENCOUNTER — Other Ambulatory Visit: Payer: Self-pay | Admitting: Internal Medicine

## 2018-08-20 NOTE — Telephone Encounter (Signed)
Rx(s) sent to pharmacy electronically.  

## 2018-11-26 DIAGNOSIS — D225 Melanocytic nevi of trunk: Secondary | ICD-10-CM | POA: Diagnosis not present

## 2018-11-26 DIAGNOSIS — Z719 Counseling, unspecified: Secondary | ICD-10-CM | POA: Diagnosis not present

## 2018-11-26 DIAGNOSIS — I2510 Atherosclerotic heart disease of native coronary artery without angina pectoris: Secondary | ICD-10-CM | POA: Diagnosis not present

## 2018-11-26 DIAGNOSIS — Z6829 Body mass index (BMI) 29.0-29.9, adult: Secondary | ICD-10-CM | POA: Diagnosis not present

## 2018-11-26 DIAGNOSIS — E7849 Other hyperlipidemia: Secondary | ICD-10-CM | POA: Diagnosis not present

## 2018-11-26 DIAGNOSIS — E119 Type 2 diabetes mellitus without complications: Secondary | ICD-10-CM | POA: Diagnosis not present

## 2018-11-26 DIAGNOSIS — E663 Overweight: Secondary | ICD-10-CM | POA: Diagnosis not present

## 2018-11-26 DIAGNOSIS — E1165 Type 2 diabetes mellitus with hyperglycemia: Secondary | ICD-10-CM | POA: Diagnosis not present

## 2018-11-26 DIAGNOSIS — L570 Actinic keratosis: Secondary | ICD-10-CM | POA: Diagnosis not present

## 2018-11-26 DIAGNOSIS — Z1389 Encounter for screening for other disorder: Secondary | ICD-10-CM | POA: Diagnosis not present

## 2018-11-26 DIAGNOSIS — Z1283 Encounter for screening for malignant neoplasm of skin: Secondary | ICD-10-CM | POA: Diagnosis not present

## 2018-11-26 DIAGNOSIS — X32XXXD Exposure to sunlight, subsequent encounter: Secondary | ICD-10-CM | POA: Diagnosis not present

## 2018-11-26 DIAGNOSIS — I10 Essential (primary) hypertension: Secondary | ICD-10-CM | POA: Diagnosis not present

## 2018-11-26 DIAGNOSIS — Z0001 Encounter for general adult medical examination with abnormal findings: Secondary | ICD-10-CM | POA: Diagnosis not present

## 2018-12-17 ENCOUNTER — Other Ambulatory Visit: Payer: Self-pay | Admitting: Internal Medicine

## 2019-05-12 NOTE — Progress Notes (Signed)
Cardiology Office Note   Date:  05/13/2019   ID:  JEHU MCCAUSLIN, DOB Dec 26, 1960, MRN [id number]  PCP:  Sharilyn Sites, MD  Cardiologist: Dr. Debara Pickett  CC: Follow  Up   History of Present Illness: Nathan [name] is a 58 y.o. male who presents for ongoing assessment and management of coronary artery disease.  The patient has a stent to the mid RCA that was placed in 2005 in the setting of an inferior MI.  Additionally the patient has a history of dyslipidemia, diabetes, hypertension, with ongoing tobacco abuse.    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  He was last seen by Dr. Debara Pickett on 07/02/2018 for DOT physical renewal.  He was noted to continue to abuse tobacco.  The patient had a stress test July in 2019 for the DOT physical and found to be low risk with normal LV function.  He was also noted to have cholesterol at goal with an LDL of less than 70.  He was counseled on smoking cessation.  He comes today without any complaints.  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  He denies any recurrent chest pain dyspnea on exertion essential fatigue PND or new symptoms of angina.  He unfortunately continues to smoke 1-1/2 to 2 packs a day depending upon "what date is" and is not inclined to quit at this time.  He is medically compliant with prescribed medications and labs are completed twice year by his primary care. Past Medical History:  Diagnosis Date  . Anxiety   . CAD (coronary artery disease)   . Diabetes mellitus   . Hypercholesteremia   . Hypertension   . Myocardial infarction [facility]) 2005    Past Surgical History:  Procedure Laterality Date  . APPENDECTOMY    . CARDIAC CATHETERIZATION  01/25/2004   Cypher DES (3.5x17mm) for PPMI to RCA (Dr. Corky Downs)  . CARDIAC CATHETERIZATION  01/03/2005   patent stent with noncritical 30% Cfx disease and 30% LAD disease (Dr. Jackie Plum)  . COLONOSCOPY  09/06/2011   Procedure:  COLONOSCOPY;  Surgeon: Jamesetta So;  Location: [facility];  Service: Gastroenterology;  Laterality: N/A;  . NM MYOCAR PERF WALL MOTION  11/2010   bruce myoview; normal pattern of perfusion in all regions, post-stress EF 62%; normal, low risk scan with improved perfusion from previous study  . TRANSTHORACIC ECHOCARDIOGRAM  12/2011   EF=>55%, mild conc LVH; LA mildly dilated; trace MR & TR with normal RSVP; trace pulm valve regurg     Current Outpatient Medications  Medication Sig Dispense Refill  . acetaminophen (TYLENOL) 500 MG tablet Take 1,000 mg by mouth every 6 (six) hours as needed. For pain     . aspirin EC 81 MG tablet Take 81 mg by mouth daily.      . clopidogrel (PLAVIX) 75 MG tablet TAKE 1 TABLET BY MOUTH EVERY DAY 90 tablet 3  . escitalopram (LEXAPRO) 10 MG tablet Take 10 mg by mouth daily.      . hydrochlorothiazide (HYDRODIURIL) 25 MG tablet Take 25 mg by mouth daily.    . metFORMIN (GLUCOPHAGE) 500 MG tablet Take 500 mg by mouth 2 (two) times daily.      . metoprolol (LOPRESSOR) 50 MG tablet Take 50 mg by mouth 2 (two) times daily.     . nitroGLYCERIN (NITROSTAT) 0.4 MG SL tablet Place 1 tablet (0.4 mg total)  under the tongue every 5 (five) minutes as needed for chest pain. Max 3 dose 25 tablet 2  . Omega-3 Fatty Acids (FISH OIL PO) Take 1 capsule by mouth 2 (two) times daily.    . ramipril (ALTACE) 10 MG capsule TAKE ONE CAPSULE BY MOUTH EVERY DAY 30 capsule 11  . rosuvastatin (CRESTOR) 10 MG tablet TAKE 1 TABLET BY MOUTH EVERY DAY 30 tablet 9   No current facility-administered medications for this visit.     Allergies:   Patient has no known allergies.    Social History:  The patient  reports that he has been smoking cigarettes. He has a 52.50 pack-year smoking history. He has never used smokeless tobacco. He reports current alcohol use. He reports that he does not use drugs.   Family History:  The patient's family history includes Cancer in his mother; Diabetes in  his paternal grandmother; Heart disease in his maternal grandmother and mother; Sudden death in his paternal grandmother.    ROS: All other systems are reviewed and negative. Unless otherwise mentioned in H&P    PHYSICAL EXAM: VS:  BP 132/70 (BP Location: Left Arm, Patient Position: Sitting, Cuff Size: Normal)   Pulse 97   Temp (!) 96.5 F (35.8 C)   Ht 5\' 6"  (1.676 m)   Wt 189 lb (85.7 kg)   BMI 30.51 kg/m  , BMI Body mass index is 30.51 kg/m.   GEN: Well nourished, well developed, in no acute distress HEENT: normal Neck: no JVD, carotid bruits, or masses Cardiac: RRR; no murmurs, rubs, or gallops,no edema  Respiratory:  Inspiratory crackles and some wheezes cleared with coughing  GI: soft, nontender, nondistended, + BS MS: no deformity or atrophy Skin: warm and dry, no rash Neuro:  Strength and sensation are intact Psych: euthymic mood, full affect   EKG: Normal sinus rhythm, heart rate of 97 bpm.  Incomplete right bundle branch block unchanged from prior EKG in January 2019.   Recent Labs: No results found for requested labs within last 8760 hours.    Lipid Panel    Component Value Date/Time   CHOL 119 05/18/2013 1055   TRIG 171 (H) 05/18/2013 1055   HDL 30 (L) 05/18/2013 1055   CHOLHDL 4.0 05/18/2013 1055   VLDL 34 05/18/2013 1055   LDLCALC 55 05/18/2013 1055      Wt Readings from Last 3 Encounters:  05/13/19 189 lb (85.7 kg)  07/02/18 185 lb 9.6 oz (84.2 kg)  05/08/18 186 lb (84.4 kg)      Other studies Reviewed:  NM Stress Test 05/08/2018 Study Highlights    The left ventricular ejection fraction is normal (55-65%).  Nuclear stress EF: 60%.  The study is normal.  This is a low risk study.  Blood pressure demonstrated a normal response to exercise.  There was no ST segment deviation noted during stress.   Low risk stress nuclear study with normal perfusion and normal left ventricular regional and global systolic function.        ASSESSMENT AND PLAN:  1.  CAD: Hx of Stent to the mid  RCA in 2005. Stress test one year ago was normal and low risk. He will continue current medication regimen of Ramipril, ASA, Plavix and metoprolol. No changes in regimen. See again in one year with NM Stress test in July 2021. He is advised to exercise during cool temperatures <85 degrees.    2.. Hypercholesterolemia: He is followed by PCP for lipids.Goal of lipids 70 or less  for patients with CAD.   3. Tobacco abuse: Counseling on need to stop smoking with high risk for progression of CAD and lung disease. Especially in light of previous stent 15 years ago. He is not included to quit at this time. I have offered her help to quit but he does not wish to quit smoking. He understands the risks.   Current medicines are reviewed at length with the patient today.    Labs/ tests ordered today include: NM Stress Test July 2021.  Phill Myron. [place] Pugh, ANP, AACC   05/13/2019 3:32 PM    [facility] [address] Office [phone] Fax [phone]

## 2019-05-13 ENCOUNTER — Encounter: Payer: Self-pay | Admitting: Adult Health

## 2019-05-13 ENCOUNTER — Telehealth: Payer: Self-pay | Admitting: Adult Health

## 2019-05-13 ENCOUNTER — Other Ambulatory Visit: Payer: Self-pay

## 2019-05-13 ENCOUNTER — Ambulatory Visit: Payer: BLUE CROSS/BLUE SHIELD | Admitting: Adult Health

## 2019-05-13 ENCOUNTER — Ambulatory Visit (INDEPENDENT_AMBULATORY_CARE_PROVIDER_SITE_OTHER): Payer: BC Managed Care – PPO | Admitting: Adult Health

## 2019-05-13 VITALS — BP 132/70 | HR 97 | Temp 96.5°F | Ht 66.0 in | Wt 189.0 lb

## 2019-05-13 DIAGNOSIS — I2510 Atherosclerotic heart disease of native coronary artery without angina pectoris: Secondary | ICD-10-CM | POA: Diagnosis not present

## 2019-05-13 DIAGNOSIS — Z720 Tobacco use: Secondary | ICD-10-CM | POA: Diagnosis not present

## 2019-05-13 DIAGNOSIS — Z1389 Encounter for screening for other disorder: Secondary | ICD-10-CM | POA: Diagnosis not present

## 2019-05-13 DIAGNOSIS — Z6829 Body mass index (BMI) 29.0-29.9, adult: Secondary | ICD-10-CM | POA: Diagnosis not present

## 2019-05-13 DIAGNOSIS — E7800 Pure hypercholesterolemia, unspecified: Secondary | ICD-10-CM

## 2019-05-13 DIAGNOSIS — E7849 Other hyperlipidemia: Secondary | ICD-10-CM | POA: Diagnosis not present

## 2019-05-13 DIAGNOSIS — E119 Type 2 diabetes mellitus without complications: Secondary | ICD-10-CM | POA: Diagnosis not present

## 2019-05-13 DIAGNOSIS — I10 Essential (primary) hypertension: Secondary | ICD-10-CM | POA: Diagnosis not present

## 2019-05-13 NOTE — Telephone Encounter (Signed)
I was unable to contact patient, phone constantly busy.

## 2019-05-13 NOTE — Patient Instructions (Signed)
Medication Instructions:  Continue current medications  If you need a refill on your cardiac medications before your next appointment, please call your pharmacy.  Labwork: None Ordered  Testing/Procedures: None Ordered  Follow-Up: You will need a follow up appointment in 1 Year.  Please call our office 2 months in advance to schedule this appointment.  You may see Pixie Casino, MD or one of the following Advanced Practice Providers on your designated Care Team: [place], [place] . Fabian Sharp, PA-C     At [facility], you and your health needs are our priority.  As part of our continuing mission to provide you with exceptional heart care, we have created designated Provider Care Teams.  These Care Teams include your primary Cardiologist (physician) and Advanced Practice Providers (APPs -  Physician Assistants and Nurse Practitioners) who all work together to provide you with the care you need, when you need it.  Thank you for choosing [facility] at [facility]!!

## 2019-10-09 DIAGNOSIS — J312 Chronic pharyngitis: Secondary | ICD-10-CM | POA: Diagnosis not present

## 2019-10-13 ENCOUNTER — Other Ambulatory Visit: Payer: Self-pay | Admitting: Internal Medicine

## 2019-11-01 DIAGNOSIS — Z6829 Body mass index (BMI) 29.0-29.9, adult: Secondary | ICD-10-CM | POA: Diagnosis not present

## 2019-11-01 DIAGNOSIS — E7849 Other hyperlipidemia: Secondary | ICD-10-CM | POA: Diagnosis not present

## 2019-11-01 DIAGNOSIS — Z1389 Encounter for screening for other disorder: Secondary | ICD-10-CM | POA: Diagnosis not present

## 2019-11-01 DIAGNOSIS — I2510 Atherosclerotic heart disease of native coronary artery without angina pectoris: Secondary | ICD-10-CM | POA: Diagnosis not present

## 2019-11-01 DIAGNOSIS — E663 Overweight: Secondary | ICD-10-CM | POA: Diagnosis not present

## 2019-11-01 DIAGNOSIS — I10 Essential (primary) hypertension: Secondary | ICD-10-CM | POA: Diagnosis not present

## 2019-11-01 DIAGNOSIS — E119 Type 2 diabetes mellitus without complications: Secondary | ICD-10-CM | POA: Diagnosis not present

## 2019-11-05 ENCOUNTER — Other Ambulatory Visit: Payer: Self-pay | Admitting: Internal Medicine

## 2019-11-25 DIAGNOSIS — F17210 Nicotine dependence, cigarettes, uncomplicated: Secondary | ICD-10-CM | POA: Diagnosis not present

## 2019-11-25 DIAGNOSIS — R070 Pain in throat: Secondary | ICD-10-CM | POA: Diagnosis not present

## 2020-03-02 DIAGNOSIS — A630 Anogenital (venereal) warts: Secondary | ICD-10-CM | POA: Diagnosis not present

## 2020-03-02 DIAGNOSIS — L820 Inflamed seborrheic keratosis: Secondary | ICD-10-CM | POA: Diagnosis not present

## 2020-03-02 DIAGNOSIS — X32XXXD Exposure to sunlight, subsequent encounter: Secondary | ICD-10-CM | POA: Diagnosis not present

## 2020-03-02 DIAGNOSIS — L570 Actinic keratosis: Secondary | ICD-10-CM | POA: Diagnosis not present

## 2020-03-02 DIAGNOSIS — Z1283 Encounter for screening for malignant neoplasm of skin: Secondary | ICD-10-CM | POA: Diagnosis not present

## 2020-03-02 DIAGNOSIS — D225 Melanocytic nevi of trunk: Secondary | ICD-10-CM | POA: Diagnosis not present

## 2020-04-12 ENCOUNTER — Other Ambulatory Visit: Payer: Self-pay | Admitting: Internal Medicine

## 2020-04-22 ENCOUNTER — Telehealth (HOSPITAL_COMMUNITY): Payer: Self-pay

## 2020-04-22 NOTE — Telephone Encounter (Signed)
Encounter complete. 

## 2020-04-24 ENCOUNTER — Ambulatory Visit (HOSPITAL_COMMUNITY)
Admission: RE | Admit: 2020-04-24 | Discharge: 2020-04-24 | Disposition: A | Discharge status: Home / Self Care | Payer: BC Managed Care – PPO | Source: Ambulatory Visit | Attending: Cardiovascular Disease | Admitting: Cardiovascular Disease

## 2020-04-24 ENCOUNTER — Other Ambulatory Visit: Payer: Self-pay

## 2020-04-24 DIAGNOSIS — Z0001 Encounter for general adult medical examination with abnormal findings: Secondary | ICD-10-CM | POA: Diagnosis not present

## 2020-04-24 DIAGNOSIS — Z719 Counseling, unspecified: Secondary | ICD-10-CM | POA: Diagnosis not present

## 2020-04-24 DIAGNOSIS — I2510 Atherosclerotic heart disease of native coronary artery without angina pectoris: Secondary | ICD-10-CM | POA: Diagnosis not present

## 2020-04-24 DIAGNOSIS — E7849 Other hyperlipidemia: Secondary | ICD-10-CM | POA: Diagnosis not present

## 2020-04-24 DIAGNOSIS — I10 Essential (primary) hypertension: Secondary | ICD-10-CM | POA: Diagnosis not present

## 2020-04-24 DIAGNOSIS — E119 Type 2 diabetes mellitus without complications: Secondary | ICD-10-CM | POA: Diagnosis not present

## 2020-04-24 DIAGNOSIS — E1165 Type 2 diabetes mellitus with hyperglycemia: Secondary | ICD-10-CM | POA: Diagnosis not present

## 2020-04-24 DIAGNOSIS — Z6829 Body mass index (BMI) 29.0-29.9, adult: Secondary | ICD-10-CM | POA: Diagnosis not present

## 2020-04-24 LAB — MYOCARDIAL PERFUSION IMAGING
Estimated workload: 10.1 METS
Exercise duration (min): 8 min
Exercise duration (sec): 40 s
LV dias vol: 104 mL (ref 62–150)
LV sys vol: 48 mL
MPHR: 162 {beats}/min
Peak HR: 146 {beats}/min
Percent HR: 90 %
Rest HR: 52 {beats}/min
SDS: 2
SRS: 1
SSS: 3
TID: 1.06

## 2020-04-24 MED ORDER — TECHNETIUM TC 99M TETROFOSMIN IV KIT
32.4000 | PACK | Freq: Once | INTRAVENOUS | Status: AC | PRN
  Administered 2020-04-24: 32.4 via INTRAVENOUS
  Filled 2020-04-24: qty 33

## 2020-04-24 MED ORDER — TECHNETIUM TC 99M TETROFOSMIN IV KIT
9.5000 | PACK | Freq: Once | INTRAVENOUS | Status: AC | PRN
  Administered 2020-04-24: 9.5 via INTRAVENOUS
  Filled 2020-04-24: qty 10

## 2020-05-04 ENCOUNTER — Ambulatory Visit: Payer: BC Managed Care – PPO | Admitting: Internal Medicine

## 2020-05-04 ENCOUNTER — Encounter: Payer: Self-pay | Admitting: Internal Medicine

## 2020-05-04 ENCOUNTER — Other Ambulatory Visit: Payer: Self-pay

## 2020-05-04 VITALS — BP 125/74 | HR 51 | Temp 96.8°F | Ht 66.0 in | Wt 186.6 lb

## 2020-05-04 DIAGNOSIS — E7800 Pure hypercholesterolemia, unspecified: Secondary | ICD-10-CM

## 2020-05-04 DIAGNOSIS — I10 Essential (primary) hypertension: Secondary | ICD-10-CM | POA: Diagnosis not present

## 2020-05-04 DIAGNOSIS — I2510 Atherosclerotic heart disease of native coronary artery without angina pectoris: Secondary | ICD-10-CM

## 2020-05-04 DIAGNOSIS — E119 Type 2 diabetes mellitus without complications: Secondary | ICD-10-CM | POA: Diagnosis not present

## 2020-05-04 NOTE — Patient Instructions (Signed)

## 2020-05-04 NOTE — Progress Notes (Signed)
Date:  05/04/2020   ID:  Nathan [name], DOB 1961/09/26, MRN [id number]  PCP:  Sharilyn Sites, MD  Primary Cardiologist:  Debara Pickett   CC: No complaints   History of Present Illness: Nathan [name] is a 59 y.o. 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  He also has a history of dyslipidemia, diabetes, hypertension and ongoing tobacco abuse. 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  He usually travels as far as 30 miles away and only occassionally stays overnight.   He also noted that on a form it required almost 2 weeks prior to submit all of the information for that physical. His last stress test was in 2015 and was negative for ischemia. 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 The DOT  requires repeat stress testing every 2 years.     Today he is here to get renewal of his DOT physical.  The patient has no complaints and reports doing well this last year.  He does however, continue to smoke.   He currently denies nausea, vomiting, fever, chest pain, shortness of breath, orthopnea, dizziness, PND, cough, congestion, abdominal pain, hematochezia, melena, lower extremity edema, claudication.  11/07/2016  [name] returns today for annual follow-up. This is also for cardiovascular evaluation for DOT physical. Last year he underwent a nuclear stress test which was negative for ischemia. Since last rate is had no new symptoms. 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 He denies any chest pain or worsening shortness of breath. EKG today shows sinus bradycardia at 55. Blood pressure is well controlled at 110/72. Recent lab work indicated that total cholesterol 113, tragus rate 68, HDL-C of 34 and LDL-C is 65, on moderate dose of a high potency rosuvastatin 10 mg.  10/23/2017  [name] was seen today for annual follow-up.  He is doing  well from a cardiac standpoint denies any chest pain or worsening shortness of breath.  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  He said he was renewed through August and would likely need a stress test at that time.  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  His weight had come down over the past couple years to his current rate around 186 pounds.  Most of these changes were due to mild diabetes which is followed by his primary care provider.  His lipid profile will be reassessed next month and will obtain a copy of that.  He will need a repeat stress test per DOT guidelines in July.  07/02/2018  [name] was seen today for follow-up.  He recently underwent stress testing in July for his DOT physical.  This was low risk with normal LV function.  He has had no new chest pain or worsening shortness of breath.  Blood pressure is well controlled is a repeat today came down to 118/72.  He is hemoglobin A1c is controlled at 6.0.  Cholesterol is at goal with LDL less than 70.  He remains physically active.  05/04/2020  [name] seen today in follow-up.  He was last seen by Beckie Busing, DNP for ongoing evaluation.  He underwent nuclear stress testing on 04/24/2020 which was negative for ischemia.  Achieved 10 METS of exercise and LVEF was 54%.  There was a small anteroseptal fixed perfusion defect which was seen previously indicating a small infarct.  This was previously noted and no new ischemia was found.  Overall he is asymptomatic.  He denies any angina.  He remains physically active.  Blood pressures well controlled today 125/74.  His recent lab work in January 2021 showed total cholesterol 119, HDL 38, LDL 63 and triglycerides 94, hemoglobin A1c 6.7.  Wt Readings from Last 3 Encounters:  05/04/20 186 lb 9.6 oz (84.6 kg)  04/24/20 189 lb (85.7 kg)  05/13/19 189 lb (85.7 kg)     Past Medical History:  Diagnosis Date  . Anxiety   . CAD (coronary artery disease)    . Diabetes mellitus   . Hypercholesteremia   . Hypertension   . Myocardial infarction [facility]) 2005    Current Outpatient Medications  Medication Sig Dispense Refill  . acetaminophen (TYLENOL) 500 MG tablet Take 1,000 mg by mouth every 6 (six) hours as needed. For pain     . aspirin EC 81 MG tablet Take 81 mg by mouth daily.      . clopidogrel (PLAVIX) 75 MG tablet TAKE 1 TABLET BY MOUTH EVERY DAY 90 tablet 3  . escitalopram (LEXAPRO) 10 MG tablet Take 10 mg by mouth daily.      . hydrochlorothiazide (HYDRODIURIL) 25 MG tablet Take 25 mg by mouth daily.    . metFORMIN (GLUCOPHAGE) 500 MG tablet Take 500 mg by mouth 2 (two) times daily.      . metoprolol (LOPRESSOR) 50 MG tablet Take 50 mg by mouth 2 (two) times daily.     . nitroGLYCERIN (NITROSTAT) 0.4 MG SL tablet Place 1 tablet (0.4 mg total) under the tongue every 5 (five) minutes as needed for chest pain. Max 3 dose 25 tablet 2  . rosuvastatin (CRESTOR) 10 MG tablet Take 1 tablet (10 mg total) by mouth daily. KEEP OV. 90 tablet 0   No current facility-administered medications for this visit.    Allergies:   No Known Allergies  Social History:  The patient  reports that he has been smoking cigarettes. He has a 52.50 pack-year smoking history. He has never used smokeless tobacco. He reports current alcohol use. He reports that he does not use drugs.   Family history:   Family History  Problem Relation Age of Onset  . Cancer Mother   . Heart disease Mother   . Heart disease Maternal Grandmother   . Diabetes Paternal Grandmother   . Sudden death Paternal Grandmother   . Colon cancer Neg Hx     ROS: Pertinent items noted in HPI and remainder of comprehensive ROS otherwise negative.   PHYSICAL EXAM: 1. VS:  BP 125/74   Pulse (!) 51   Temp (!) 96.8 F (36 C)   Ht 5\' 6"  (1.676 m)   Wt 186 lb 9.6 oz (84.6 kg)   SpO2 95%   BMI 30.12 kg/m  General appearance: alert and no distress Neck: no carotid bruit, no JVD and  thyroid not enlarged, symmetric, no tenderness/mass/nodules Lungs: clear to auscultation bilaterally Heart: regular rate and rhythm, S1, S2 normal, no murmur, click, rub or gallop Abdomen: soft, non-tender; bowel sounds normal; no masses,  no organomegaly Extremities: extremities normal, atraumatic, no cyanosis or edema Pulses: 2+ and symmetric Skin: Skin color, texture, turgor normal. No rashes or lesions Neurologic: Grossly normal Psych: Pleasant  EKG:  Sinus bradycardia 51-personally reviewed  ASSESSMENT: 1. CAD with history of inferior MI status post  PCI to the RCA in 2005 2. Hypertension-controlled 3. Dyslipidemia 4. Type 2 diabetes 5. Encounter for DOT physical  PLAN: Mr. Sondgeroth had another low risk stress test which is unchanged from a study 2 years ago.  He remains asymptomatic.  Blood pressures well controlled.  Hemoglobin A1c is 6.7.  Cholesterol is at target with LDL less than 70.  He has had no anginal symptoms.  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  We will provide the necessary paperwork to his examiner.  With me annually or sooner as necessary.  Pixie Casino, MD, [facility], [place] Director of the Advanced Lipid Disorders &  Cardiovascular Risk Reduction Clinic Diplomate of the American Board of Clinical Lipidology Attending Cardiologist  Direct Dial: [phone]  Fax: [phone]  Website:  [email or web address]

## 2020-07-07 ENCOUNTER — Other Ambulatory Visit: Payer: Self-pay | Admitting: Internal Medicine

## 2020-10-08 ENCOUNTER — Other Ambulatory Visit: Payer: Self-pay | Admitting: Internal Medicine

## 2020-10-12 ENCOUNTER — Other Ambulatory Visit: Payer: Self-pay | Admitting: Internal Medicine

## 2020-10-26 DIAGNOSIS — Z0001 Encounter for general adult medical examination with abnormal findings: Secondary | ICD-10-CM | POA: Diagnosis not present

## 2020-10-26 DIAGNOSIS — E118 Type 2 diabetes mellitus with unspecified complications: Secondary | ICD-10-CM | POA: Diagnosis not present

## 2020-10-26 DIAGNOSIS — I2510 Atherosclerotic heart disease of native coronary artery without angina pectoris: Secondary | ICD-10-CM | POA: Diagnosis not present

## 2020-10-26 DIAGNOSIS — I10 Essential (primary) hypertension: Secondary | ICD-10-CM | POA: Diagnosis not present

## 2020-10-26 DIAGNOSIS — Z6829 Body mass index (BMI) 29.0-29.9, adult: Secondary | ICD-10-CM | POA: Diagnosis not present

## 2020-10-26 DIAGNOSIS — Z1389 Encounter for screening for other disorder: Secondary | ICD-10-CM | POA: Diagnosis not present

## 2020-10-26 DIAGNOSIS — E7849 Other hyperlipidemia: Secondary | ICD-10-CM | POA: Diagnosis not present

## 2020-10-26 DIAGNOSIS — Z1331 Encounter for screening for depression: Secondary | ICD-10-CM | POA: Diagnosis not present

## 2020-10-26 DIAGNOSIS — Z719 Counseling, unspecified: Secondary | ICD-10-CM | POA: Diagnosis not present

## 2020-12-07 DIAGNOSIS — K7689 Other specified diseases of liver: Secondary | ICD-10-CM | POA: Diagnosis not present

## 2021-01-30 ENCOUNTER — Emergency Department (HOSPITAL_COMMUNITY)
Admission: EM | Admit: 2021-01-30 | Discharge: 2021-01-30 | Disposition: A | Discharge status: Home / Self Care | Payer: BC Managed Care – PPO | Attending: Emergency Medicine | Admitting: Emergency Medicine

## 2021-01-30 ENCOUNTER — Encounter (HOSPITAL_COMMUNITY): Payer: Self-pay | Admitting: Emergency Medicine

## 2021-01-30 ENCOUNTER — Emergency Department (HOSPITAL_COMMUNITY): Payer: BC Managed Care – PPO

## 2021-01-30 DIAGNOSIS — R911 Solitary pulmonary nodule: Secondary | ICD-10-CM | POA: Diagnosis not present

## 2021-01-30 DIAGNOSIS — E119 Type 2 diabetes mellitus without complications: Secondary | ICD-10-CM | POA: Diagnosis not present

## 2021-01-30 DIAGNOSIS — Z20822 Contact with and (suspected) exposure to covid-19: Secondary | ICD-10-CM | POA: Diagnosis not present

## 2021-01-30 DIAGNOSIS — Z79899 Other long term (current) drug therapy: Secondary | ICD-10-CM | POA: Diagnosis not present

## 2021-01-30 DIAGNOSIS — Z7984 Long term (current) use of oral hypoglycemic drugs: Secondary | ICD-10-CM | POA: Diagnosis not present

## 2021-01-30 DIAGNOSIS — R531 Weakness: Secondary | ICD-10-CM | POA: Insufficient documentation

## 2021-01-30 DIAGNOSIS — F17210 Nicotine dependence, cigarettes, uncomplicated: Secondary | ICD-10-CM | POA: Insufficient documentation

## 2021-01-30 DIAGNOSIS — Z7902 Long term (current) use of antithrombotics/antiplatelets: Secondary | ICD-10-CM | POA: Diagnosis not present

## 2021-01-30 DIAGNOSIS — I2510 Atherosclerotic heart disease of native coronary artery without angina pectoris: Secondary | ICD-10-CM | POA: Insufficient documentation

## 2021-01-30 DIAGNOSIS — R0602 Shortness of breath: Secondary | ICD-10-CM | POA: Insufficient documentation

## 2021-01-30 DIAGNOSIS — J189 Pneumonia, unspecified organism: Secondary | ICD-10-CM

## 2021-01-30 DIAGNOSIS — I10 Essential (primary) hypertension: Secondary | ICD-10-CM | POA: Insufficient documentation

## 2021-01-30 DIAGNOSIS — Z7982 Long term (current) use of aspirin: Secondary | ICD-10-CM | POA: Diagnosis not present

## 2021-01-30 LAB — COMPREHENSIVE METABOLIC PANEL
ALT: 71 U/L — ABNORMAL HIGH (ref 0–44)
AST: 47 U/L — ABNORMAL HIGH (ref 15–41)
Albumin: 3.8 g/dL (ref 3.5–5.0)
Alkaline Phosphatase: 55 U/L (ref 38–126)
Anion gap: 13 (ref 5–15)
BUN: 24 mg/dL — ABNORMAL HIGH (ref 6–20)
CO2: 24 mmol/L (ref 22–32)
Calcium: 9.2 mg/dL (ref 8.9–10.3)
Chloride: 102 mmol/L (ref 98–111)
Creatinine, Ser: 1.22 mg/dL (ref 0.61–1.24)
GFR, Estimated: >60 mL/min (ref >60)
Glucose, Bld: 124 mg/dL — ABNORMAL HIGH (ref 70–99)
Potassium: 3.3 mmol/L — ABNORMAL LOW (ref 3.5–5.1)
Sodium: 139 mmol/L (ref 135–145)
Total Bilirubin: 0.4 mg/dL (ref 0.3–1.2)
Total Protein: 6.7 g/dL (ref 6.5–8.1)

## 2021-01-30 LAB — CBC
HCT: 44 % (ref 39.0–52.0)
Hemoglobin: 15.7 g/dL (ref 13.0–17.0)
MCH: 31.8 pg (ref 26.0–34.0)
MCHC: 35.7 g/dL (ref 30.0–36.0)
MCV: 89.2 fL (ref 80.0–100.0)
Platelets: 246 10*3/uL (ref 150–400)
RBC: 4.93 MIL/uL (ref 4.22–5.81)
RDW: 12.2 % (ref 11.5–15.5)
WBC: 6 10*3/uL (ref 4.0–10.5)
nRBC: 0 % (ref 0.0–0.2)

## 2021-01-30 LAB — LIPASE, BLOOD: Lipase: 41 U/L (ref 11–51)

## 2021-01-30 LAB — RESP PANEL BY RT-PCR (FLU A&B, COVID) ARPGX2
Influenza A by PCR: NEGATIVE
Influenza B by PCR: NEGATIVE
SARS Coronavirus 2 by RT PCR: NEGATIVE

## 2021-01-30 LAB — TROPONIN I (HIGH SENSITIVITY)
Troponin I (High Sensitivity): 6 ng/L (ref <18)
Troponin I (High Sensitivity): 6 ng/L (ref <18)

## 2021-01-30 MED ORDER — ALBUTEROL SULFATE HFA 108 (90 BASE) MCG/ACT IN AERS: 2.0000 | INHALATION_SPRAY | RESPIRATORY_TRACT | Status: DC | PRN

## 2021-01-30 MED ORDER — DOXYCYCLINE HYCLATE 100 MG PO CAPS: 100.0000 mg | ORAL_CAPSULE | Freq: Two times a day (BID) | ORAL | 0 refills | Status: DC

## 2021-01-30 MED ORDER — IOHEXOL 350 MG/ML SOLN
75.0000 mL | Freq: Once | INTRAVENOUS | Status: AC | PRN
  Administered 2021-01-30: 75 mL via INTRAVENOUS

## 2021-01-30 NOTE — ED Triage Notes (Signed)
Reports intermittent vomiting and diarrhea since Monday.  Reports SOB over the past 3-4 days.  Denies pain.

## 2021-01-30 NOTE — ED Provider Notes (Signed)
[facility] EMERGENCY DEPARTMENT Provider Note   CSN: [id number] Arrival date & time: 01/30/21  1206     History Chief Complaint  Patient presents with  . Shortness of Breath  . Vomiting    LOTUS GOVER is a 60 y.o. male.  60 year old male presents with increasing weakness.  States earlier this week an episode of become sweaty and short of breath.  States that his dyspnea was exertional.  Has since resolved.  Notes that he had given a sleep last night but awoke this morning week.  No fever or chills.  No vomiting or diarrhea currently but did have some problems with it as well.  Denies any urinary symptoms.  Went to urgent care and was sent here for possible ACS work-up.  Patient does have a history of CAD with coronary stent but states that his current symptoms are different from those in the past.  He does work as a Administrator.  Denies any current dyspnea on exertion        Past Medical History:  Diagnosis Date  . Anxiety   . CAD (coronary artery disease)   . Diabetes mellitus   . Hypercholesteremia   . Hypertension   . Myocardial infarction [facility]) 2005    Patient Active Problem List   Diagnosis Date Noted  . Obesity (BMI 30-39.9) 10/26/2015  . CAD in native artery 11/20/2013  . Dyslipidemia 11/20/2013  . Essential hypertension 11/20/2013  . Type 2 diabetes mellitus without complication, without long-term current use of insulin ([place]) 11/20/2013  . Tobacco abuse 11/20/2013  . Encounter for examination required by Department of Transportation (DOT) 11/20/2013    Past Surgical History:  Procedure Laterality Date  . APPENDECTOMY    . CARDIAC CATHETERIZATION  01/25/2004   Cypher DES (3.5x50mm) for PPMI to RCA (Dr. Corky Downs)  . CARDIAC CATHETERIZATION  01/03/2005   patent stent with noncritical 30% Cfx disease and 30% LAD disease (Dr. Jackie Plum)  . COLONOSCOPY  09/06/2011   Procedure: COLONOSCOPY;  Surgeon: Jamesetta So;  Location: [facility];   Service: Gastroenterology;  Laterality: N/A;  . NM MYOCAR PERF WALL MOTION  11/2010   bruce myoview; normal pattern of perfusion in all regions, post-stress EF 62%; normal, low risk scan with improved perfusion from previous study  . TRANSTHORACIC ECHOCARDIOGRAM  12/2011   EF=>55%, mild conc LVH; LA mildly dilated; trace MR & TR with normal RSVP; trace pulm valve regurg       Family History  Problem Relation Age of Onset  . Cancer Mother   . Heart disease Mother   . Heart disease Maternal Grandmother   . Diabetes Paternal Grandmother   . Sudden death Paternal Grandmother   . Colon cancer Neg Hx     Social History   Tobacco Use  . Smoking status: Current Every Day Smoker    Packs/day: 1.50    Years: 35.00    Pack years: 52.50    Types: Cigarettes  . Smokeless tobacco: Never Used  . Tobacco comment: 1-1.5 pdd (11/20/13)  Substance Use Topics  . Alcohol use: Yes    Alcohol/week: 0.0 standard drinks    Comment: occasionally on weekends  . Drug use: No    Home Medications Prior to Admission medications   Medication Sig Start Date End Date Taking? Authorizing Provider  acetaminophen (TYLENOL) 500 MG tablet Take 1,000 mg by mouth every 6 (six) hours as needed. For pain     [provider]  aspirin EC 81 MG tablet Take 81 mg by mouth daily.      [provider]  clopidogrel (PLAVIX) 75 MG tablet TAKE 1 TABLET BY MOUTH EVERY DAY 10/08/20   Hilty, Nadean Corwin, MD  escitalopram (LEXAPRO) 10 MG tablet Take 10 mg by mouth daily.      [provider]  hydrochlorothiazide (HYDRODIURIL) 25 MG tablet Take 25 mg by mouth daily.    [provider]  metFORMIN (GLUCOPHAGE) 500 MG tablet Take 500 mg by mouth 2 (two) times daily.      [provider]  metoprolol (LOPRESSOR) 50 MG tablet Take 50 mg by mouth 2 (two) times daily.     [provider]  nitroGLYCERIN (NITROSTAT) 0.4 MG SL tablet Place 1 tablet (0.4 mg total) under the tongue every 5  (five) minutes as needed for chest pain. Max 3 dose 11/07/16 05/13/19  Hilty, Nadean Corwin, MD  rosuvastatin (CRESTOR) 10 MG tablet Take 1 tablet (10 mg total) by mouth daily. 07/07/20   Hilty, Nadean Corwin, MD    Allergies    Patient has no known allergies.  Review of Systems   Review of Systems  All other systems reviewed and are negative.   Physical Exam Updated Vital Signs BP 97/61 (BP Location: Left Arm)   Pulse 72   Temp 97.9 F (36.6 C) (Oral)   Resp 18   SpO2 100%   Physical Exam Vitals and nursing note reviewed.  Constitutional:      General: He is not in acute distress.    Appearance: Normal appearance. He is well-developed. He is not toxic-appearing.  HENT:     Head: Normocephalic and atraumatic.  Eyes:     General: Lids are normal.     Conjunctiva/sclera: Conjunctivae normal.     Pupils: Pupils are equal, round, and reactive to light.  Neck:     Thyroid: No thyroid mass.     Trachea: No tracheal deviation.  Cardiovascular:     Rate and Rhythm: Normal rate and regular rhythm.     Heart sounds: Normal heart sounds. No murmur heard. No gallop.   Pulmonary:     Effort: Pulmonary effort is normal. No respiratory distress.     Breath sounds: Normal breath sounds. No stridor. No decreased breath sounds, wheezing, rhonchi or rales.  Abdominal:     General: Bowel sounds are normal. There is no distension.     Palpations: Abdomen is soft.     Tenderness: There is no abdominal tenderness. There is no rebound.  Musculoskeletal:        General: No tenderness. Normal range of motion.     Cervical back: Normal range of motion and neck supple.  Skin:    General: Skin is warm and dry.     Findings: No abrasion or rash.  Neurological:     Mental Status: He is alert and oriented to person, place, and time.     GCS: GCS eye subscore is 4. GCS verbal subscore is 5. GCS motor subscore is 6.     Cranial Nerves: No cranial nerve deficit.     Sensory: No sensory deficit.   Psychiatric:        Speech: Speech normal.        Behavior: Behavior normal.     ED Results / Procedures / Treatments   Labs (all labs ordered are listed, but only abnormal results are displayed) Labs Reviewed  RESP PANEL BY RT-PCR (FLU A&B, COVID) ARPGX2  CBC  LIPASE, BLOOD  COMPREHENSIVE METABOLIC PANEL  TROPONIN I (HIGH SENSITIVITY)    EKG EKG Interpretation  Date/Time:  Saturday January 30 2021 12:19:35 EDT Ventricular Rate:  73 PR Interval:  180 QRS Duration: 100 QT Interval:  398 QTC Calculation: 438 R Axis:   76 Text Interpretation: Normal sinus rhythm Incomplete right bundle branch block Nonspecific ST abnormality Abnormal ECG Confirmed by Lacretia Leigh ([id number]) on 01/30/2021 12:48:02 PM   Radiology No results found.  Procedures Procedures   Medications Ordered in ED Medications - No data to display  ED Course  I have reviewed the triage vital signs and the nursing notes.  Pertinent labs & imaging results that were available during my care of the patient were reviewed by me and considered in my medical decision making (see chart for details).    MDM Rules/Calculators/A&P                         EKG without ischemic findings.  First  troponin negative. CT of the chest consistent with pneumonia.  Covid test negative here.  He is not hypoxic.  Will place on antibiotics and discharge home Final Clinical Impression(s) / ED Diagnoses Final diagnoses:  None    Rx / DC Orders ED Discharge Orders    None       Lacretia Leigh, MD 01/30/21 1552

## 2021-01-30 NOTE — ED Notes (Signed)
Patient Alert and oriented to baseline. Stable and ambulatory to baseline. Patient verbalized understanding of the discharge instructions.  Patient belongings were taken by the patient.   

## 2021-04-05 DIAGNOSIS — Z1283 Encounter for screening for malignant neoplasm of skin: Secondary | ICD-10-CM | POA: Diagnosis not present

## 2021-04-05 DIAGNOSIS — L308 Other specified dermatitis: Secondary | ICD-10-CM | POA: Diagnosis not present

## 2021-04-05 DIAGNOSIS — E119 Type 2 diabetes mellitus without complications: Secondary | ICD-10-CM | POA: Diagnosis not present

## 2021-04-05 DIAGNOSIS — B353 Tinea pedis: Secondary | ICD-10-CM | POA: Diagnosis not present

## 2021-04-05 DIAGNOSIS — X32XXXD Exposure to sunlight, subsequent encounter: Secondary | ICD-10-CM | POA: Diagnosis not present

## 2021-04-05 DIAGNOSIS — H524 Presbyopia: Secondary | ICD-10-CM | POA: Diagnosis not present

## 2021-04-05 DIAGNOSIS — D225 Melanocytic nevi of trunk: Secondary | ICD-10-CM | POA: Diagnosis not present

## 2021-04-05 DIAGNOSIS — B078 Other viral warts: Secondary | ICD-10-CM | POA: Diagnosis not present

## 2021-04-05 DIAGNOSIS — Z7984 Long term (current) use of oral hypoglycemic drugs: Secondary | ICD-10-CM | POA: Diagnosis not present

## 2021-04-05 DIAGNOSIS — L570 Actinic keratosis: Secondary | ICD-10-CM | POA: Diagnosis not present

## 2021-04-05 DIAGNOSIS — H52223 Regular astigmatism, bilateral: Secondary | ICD-10-CM | POA: Diagnosis not present

## 2021-05-03 DIAGNOSIS — E118 Type 2 diabetes mellitus with unspecified complications: Secondary | ICD-10-CM | POA: Diagnosis not present

## 2021-05-03 DIAGNOSIS — Z6828 Body mass index (BMI) 28.0-28.9, adult: Secondary | ICD-10-CM | POA: Diagnosis not present

## 2021-05-03 DIAGNOSIS — E782 Mixed hyperlipidemia: Secondary | ICD-10-CM | POA: Diagnosis not present

## 2021-05-03 DIAGNOSIS — I2510 Atherosclerotic heart disease of native coronary artery without angina pectoris: Secondary | ICD-10-CM | POA: Diagnosis not present

## 2021-05-03 DIAGNOSIS — I10 Essential (primary) hypertension: Secondary | ICD-10-CM | POA: Diagnosis not present

## 2021-05-03 DIAGNOSIS — E663 Overweight: Secondary | ICD-10-CM | POA: Diagnosis not present

## 2021-05-04 ENCOUNTER — Encounter: Payer: Self-pay | Admitting: Internal Medicine

## 2021-05-04 ENCOUNTER — Ambulatory Visit: Payer: BC Managed Care – PPO | Admitting: Internal Medicine

## 2021-05-04 ENCOUNTER — Other Ambulatory Visit: Payer: Self-pay

## 2021-05-04 VITALS — BP 134/74 | HR 61 | Ht 66.0 in | Wt 177.8 lb

## 2021-05-04 DIAGNOSIS — Z0289 Encounter for other administrative examinations: Secondary | ICD-10-CM | POA: Diagnosis not present

## 2021-05-04 DIAGNOSIS — I10 Essential (primary) hypertension: Secondary | ICD-10-CM | POA: Diagnosis not present

## 2021-05-04 DIAGNOSIS — E7800 Pure hypercholesterolemia, unspecified: Secondary | ICD-10-CM | POA: Diagnosis not present

## 2021-05-04 DIAGNOSIS — I2510 Atherosclerotic heart disease of native coronary artery without angina pectoris: Secondary | ICD-10-CM

## 2021-05-04 DIAGNOSIS — E119 Type 2 diabetes mellitus without complications: Secondary | ICD-10-CM

## 2021-05-04 NOTE — Patient Instructions (Signed)

## 2021-05-04 NOTE — Progress Notes (Signed)
Date:  05/04/2021   ID:  Nathan [name], DOB 1961/05/08, MRN [id number]  PCP:  Sharilyn Sites, MD  Primary Cardiologist:  Debara Pickett   CC: No complaints   History of Present Illness: Nathan [name] is a 60 y.o. 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  He also has a history of dyslipidemia, diabetes, hypertension and ongoing tobacco abuse. 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  He usually travels as far as 30 miles away and only occassionally stays overnight.   He also noted that on a form it required almost 2 weeks prior to submit all of the information for that physical. His last stress test was in 2015 and was negative for ischemia. 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  The DOT  requires repeat stress testing every 2 years.     Today he is here to get renewal of his DOT physical.  The patient has no complaints and reports doing well this last year.  He does however, continue to smoke.   He currently denies nausea, vomiting, fever, chest pain, shortness of breath, orthopnea, dizziness, PND, cough, congestion, abdominal pain, hematochezia, melena, lower extremity edema, claudication.  11/07/2016  Nathan [name] returns today for annual follow-up. This is also for cardiovascular evaluation for DOT physical. Last year he underwent a nuclear stress test which was negative for ischemia. Since last rate is had no new symptoms. 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 He denies any chest pain or worsening shortness of breath. EKG today shows sinus bradycardia at 55. Blood pressure is well controlled at 110/72. Recent lab work indicated that total cholesterol 113, tragus rate 68, HDL-C of 34 and LDL-C is 65, on moderate dose of a high potency rosuvastatin 10 mg.  10/23/2017  Nathan [name] was seen today for annual follow-up.  He is doing  well from a cardiac standpoint denies any chest pain or worsening shortness of breath.  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  He said he was renewed through August and would likely need a stress test at that time.  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  His weight had come down over the past couple years to his current rate around 186 pounds.  Most of these changes were due to mild diabetes which is followed by his primary care provider.  His lipid profile will be reassessed next month and will obtain a copy of that.  He will need a repeat stress test per DOT guidelines in July.  07/02/2018  Nathan [name] was seen today for follow-up.  He recently underwent stress testing in July for his DOT physical.  This was low risk with normal LV function.  He has had no new chest pain or worsening shortness of breath.  Blood pressure is well controlled is a repeat today came down to 118/72.  He is hemoglobin A1c is controlled at 6.0.  Cholesterol is at goal with LDL less than 70.  He remains physically active.  05/04/2020  Nathan [name] seen today in follow-up.  He was last seen by Beckie Busing, DNP for ongoing evaluation.  He underwent nuclear stress testing on 04/24/2020 which was negative for ischemia.  Achieved 10 METS of exercise and LVEF was 54%.  There was a small anteroseptal fixed perfusion defect which was seen previously indicating a small infarct.  This was previously noted and no new ischemia was found.  Overall he is asymptomatic.  He denies any angina.  He remains physically active.  Blood pressures well controlled today 125/74.  His recent lab work in January 2021 showed total cholesterol 119, HDL 38, LDL 63 and triglycerides 94, hemoglobin A1c 6.7.  05/04/2021  Nathan [name] returns today for follow-up.  Overall he continues to do well.  Is now been a year since I last saw [name].  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  This is a follow-up for that.  He has no chest pain or  worsening shortness of breath.  His last Myoview in 2021 is listed above.  Recently his blood sugar was elevated somewhat at 8.1 however has improved to 6.0 A1c.  Triglycerides recently rechecked were slightly elevated 153 but otherwise LDL cholesterol is at goal less than 70.  Wt Readings from Last 3 Encounters:  05/04/21 177 lb 12.8 oz (80.6 kg)  05/04/20 186 lb 9.6 oz (84.6 kg)  04/24/20 189 lb (85.7 kg)     Past Medical History:  Diagnosis Date   Anxiety    CAD (coronary artery disease)    Diabetes mellitus    Hypercholesteremia    Hypertension    Myocardial infarction ([place]) 2005    Current Outpatient Medications  Medication Sig Dispense Refill   acetaminophen (TYLENOL) 500 MG tablet Take 1,000 mg by mouth every 6 (six) hours as needed. For pain      aspirin EC 81 MG tablet Take 81 mg by mouth daily.       augmented betamethasone dipropionate (DIPROLENE-AF) 0.05 % cream Apply topically 2 (two) times daily as needed.     clopidogrel (PLAVIX) 75 MG tablet TAKE 1 TABLET BY MOUTH EVERY DAY 90 tablet 3   escitalopram (LEXAPRO) 10 MG tablet Take 10 mg by mouth daily.       hydrochlorothiazide (HYDRODIURIL) 25 MG tablet Take 25 mg by mouth daily.     ketoconazole (NIZORAL) 2 % cream Apply topically 2 (two) times daily as needed.     metFORMIN (GLUCOPHAGE) 500 MG tablet Take 500 mg by mouth 2 (two) times daily.       metoprolol (LOPRESSOR) 50 MG tablet Take 50 mg by mouth 2 (two) times daily.      olmesartan (BENICAR) 40 MG tablet Take 40 mg by mouth daily.     rosuvastatin (CRESTOR) 10 MG tablet Take 1 tablet (10 mg total) by mouth daily. 90 tablet 3   doxycycline (VIBRAMYCIN) 100 MG capsule Take 1 capsule (100 mg total) by mouth 2 (two) times daily. (Patient not taking: Reported on 05/04/2021) 20 capsule 0   nitroGLYCERIN (NITROSTAT) 0.4 MG SL tablet Place 1 tablet (0.4 mg total) under the tongue every 5 (five) minutes as needed for chest pain. Max 3 dose 25 tablet 2   No current  facility-administered medications for this visit.    Allergies:   No Known Allergies  Social History:  The patient  reports that he has been smoking cigarettes. He has a 52.50 pack-year smoking history. He has never used smokeless tobacco. He reports current alcohol use. He reports that he does not use drugs.   Family history:   Family History  Problem Relation Age of Onset   Cancer Mother    Heart disease Mother    Heart disease Maternal Grandmother    Diabetes Paternal  Grandmother    Sudden death Paternal Grandmother    Colon cancer Neg Hx     ROS: Pertinent items noted in HPI and remainder of comprehensive ROS otherwise negative.   PHYSICAL EXAM: VS:  BP 134/74   Pulse 61   Ht 5\' 6"  (1.676 m)   Wt 177 lb 12.8 oz (80.6 kg)   SpO2 96%   BMI 28.70 kg/m  General appearance: alert and no distress Neck: no carotid bruit, no JVD and thyroid not enlarged, symmetric, no tenderness/mass/nodules Lungs: clear to auscultation bilaterally Heart: regular rate and rhythm, S1, S2 normal, no murmur, click, rub or gallop Abdomen: soft, non-tender; bowel sounds normal; no masses,  no organomegaly Extremities: extremities normal, atraumatic, no cyanosis or edema Pulses: 2+ and symmetric Skin: Skin color, texture, turgor normal. No rashes or lesions Neurologic: Grossly normal Psych: Pleasant  EKG:  Normal sinus rhythm at 61, incomplete right bundle branch block -personally reviewed  ASSESSMENT: CAD with history of inferior MI status post PCI to the RCA in 2005 Hypertension-controlled Dyslipidemia Type 2 diabetes Encounter for DOT physical  PLAN: Nathan [name] needs to do well without any chest pain or worsening shortness of breath.  His last Myoview in 2021 showed a small fixed inferior defect without ischemia.  He continues to be at low risk for DOT driving in my opinion and from a cardiac standpoint I would clear [name] for that.  Blood pressure is well controlled.  His cholesterol has  been near goal.  A1c is recently improved to 6.0.  With me annually or sooner as necessary.  Pixie Casino, MD, [facility], [place] Director of the Advanced Lipid Disorders &  Cardiovascular Risk Reduction Clinic Diplomate of the American Board of Clinical Lipidology Attending Cardiologist  Direct Dial: [phone]  Fax: [phone]  Website:  [email or web address]

## 2021-11-01 DIAGNOSIS — Z23 Encounter for immunization: Secondary | ICD-10-CM | POA: Diagnosis not present

## 2021-11-01 DIAGNOSIS — Z1331 Encounter for screening for depression: Secondary | ICD-10-CM | POA: Diagnosis not present

## 2021-11-01 DIAGNOSIS — Z6829 Body mass index (BMI) 29.0-29.9, adult: Secondary | ICD-10-CM | POA: Diagnosis not present

## 2021-11-01 DIAGNOSIS — E118 Type 2 diabetes mellitus with unspecified complications: Secondary | ICD-10-CM | POA: Diagnosis not present

## 2021-11-01 DIAGNOSIS — I2510 Atherosclerotic heart disease of native coronary artery without angina pectoris: Secondary | ICD-10-CM | POA: Diagnosis not present

## 2021-11-01 DIAGNOSIS — Z0000 Encounter for general adult medical examination without abnormal findings: Secondary | ICD-10-CM | POA: Diagnosis not present

## 2021-11-01 DIAGNOSIS — E7849 Other hyperlipidemia: Secondary | ICD-10-CM | POA: Diagnosis not present

## 2021-11-01 DIAGNOSIS — E663 Overweight: Secondary | ICD-10-CM | POA: Diagnosis not present

## 2021-11-09 ENCOUNTER — Encounter: Payer: Self-pay | Admitting: *Deleted

## 2021-12-09 ENCOUNTER — Ambulatory Visit (INDEPENDENT_AMBULATORY_CARE_PROVIDER_SITE_OTHER): Payer: Self-pay | Admitting: *Deleted

## 2021-12-09 ENCOUNTER — Other Ambulatory Visit: Payer: Self-pay

## 2021-12-09 VITALS — Ht 66.0 in | Wt 175.0 lb

## 2021-12-09 DIAGNOSIS — Z86010 Personal history of colonic polyps: Secondary | ICD-10-CM

## 2021-12-09 NOTE — Progress Notes (Signed)
History of CAD, MI, cardiac stents, on Plavix, needs OV.

## 2021-12-09 NOTE — Progress Notes (Signed)
Gastroenterology Pre-Procedure Review  Request Date: 12/09/2021 Requesting Physician: Dr. Hilma Favors, Last TCS done 09/06/2011 by Dr. Arnoldo Morale, sessile polyp, normal colon  PATIENT REVIEW QUESTIONS: The patient responded to the following health history questions as indicated:    1. Diabetes Melitis: yes, type II 2. Joint replacements in the past 12 months: no 3. Major health problems in the past 3 months: no 4. Has an artificial valve or MVP: no 5. Has a defibrillator: no 6. Has been advised in past to take antibiotics in advance of a procedure like teeth cleaning: no 7. Family history of colon cancer: no 8. Alcohol Use: no 9. Illicit drug Use: no 10. History of sleep apnea: no 11. History of coronary artery or other vascular stents placed within the last 12 months: no 12. History of any prior anesthesia complications: no 13. Body mass index is 28.25 kg/m.    MEDICATIONS & ALLERGIES:    Patient reports the following regarding taking any blood thinners:   Plavix? Yes, 75 mg daily Aspirin? Yes, 81 mg daily Coumadin? no Brilinta? no Xarelto? no Eliquis? no Pradaxa? no Savaysa? no Effient? no  Patient confirms/reports the following medications:  Current Outpatient Medications  Medication Sig Dispense Refill   acetaminophen (TYLENOL) 500 MG tablet Take 1,000 mg by mouth as needed. For pain     aspirin EC 81 MG tablet Take 81 mg by mouth daily.       augmented betamethasone dipropionate (DIPROLENE-AF) 0.05 % cream Apply topically as needed.     clopidogrel (PLAVIX) 75 MG tablet TAKE 1 TABLET BY MOUTH EVERY DAY 90 tablet 3   escitalopram (LEXAPRO) 10 MG tablet Take 10 mg by mouth daily.       hydrochlorothiazide (HYDRODIURIL) 25 MG tablet Take 25 mg by mouth daily.     ketoconazole (NIZORAL) 2 % cream Apply topically as needed.     metFORMIN (GLUCOPHAGE) 500 MG tablet Take 500 mg by mouth 2 (two) times daily.       metoprolol (LOPRESSOR) 50 MG tablet Take 50 mg by mouth 2 (two) times  daily.      nitroGLYCERIN (NITROSTAT) 0.4 MG SL tablet Place 1 tablet (0.4 mg total) under the tongue every 5 (five) minutes as needed for chest pain. Max 3 dose 25 tablet 2   olmesartan (BENICAR) 40 MG tablet Take 40 mg by mouth daily.     rosuvastatin (CRESTOR) 10 MG tablet Take 1 tablet (10 mg total) by mouth daily. 90 tablet 3   No current facility-administered medications for this visit.    Patient confirms/reports the following allergies:  No Known Allergies  No orders of the defined types were placed in this encounter.   AUTHORIZATION INFORMATION Primary Insurance: [place],  [place] #: [id number],  Group #: [id number] Pre-Cert / Josem Kaufmann required:  Pre-Cert / Auth #:   SCHEDULE INFORMATION: Procedure has been scheduled as follows:  Date: , Time:   Location: [facility] with Dr. Gala Romney  This Gastroenterology Pre-Precedure Review Form is being routed to the following provider(s): Aliene Altes, PA-C

## 2021-12-14 NOTE — Progress Notes (Signed)
Spoke to pt.  Informed him that he needs ov to arrange colonoscopy due to medical history.  Scheduled virtual for 12/22/2021 at 4:00 with Neil Crouch, PA-C.

## 2021-12-21 NOTE — Progress Notes (Signed)
Primary Care Physician:  Sharilyn Sites, MD Primary GI:  Garfield Cornea, MD Patient Location: Home Provider Location: [facility] office  Reason for Visit:  Chief Complaint  Patient presents with   Colonoscopy    Persons present on the virtual encounter, with roles: Patient, myself (provider),Tammy Clifton CMA (updated meds and allergies)  Total time (minutes) spent on medical discussion: 8 minutes  Due to COVID-19, visit was conducted using Mychart video method.  Visit was requested by patient.  Virtual Visit via Mychart video  I connected with Nathan [name] on 12/22/21 at  4:00 PM EST by Mychart video and verified that I am speaking with the correct person using two identifiers.   I discussed the limitations, risks, security and privacy concerns of performing an evaluation and management service by telephone/video and the availability of in person appointments. I also discussed with the patient that there may be a patient responsible charge related to this service. The patient expressed understanding and agreed to proceed.   HPI:   Nathan [name] is a 61 y.o. male who presents for virtual visit regarding scheduling a screening colonoscopy. Colonoscopy November 2012 by Dr. Arnoldo Morale, sessile polyp removed.  No evidence of adenomatous changes seen.  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  History of dyslipidemia, diabetes, hypertension, ongoing tobacco abuse.  Works as a Programmer, systems.  Myoview in 2021 showed small fixed inferior defect without ischemia.  From a cardiac standpoint he has remained low risk for DOT driving, last evaluated by cardiology in July 2022.   Works as long Fish farm manager. Home on the weekends. Doing well.  No upper GI issues.  No abdominal pain.  Bowel movements regular.  No blood in the stool or melena.  Appetite good.  No unintentional weight loss  States he had labs back in January from his PCP, reports his labs  look good.  We discussed labs from April with mild elevation of AST/ALT.  At that time he was in the ED, sick with pneumonia.  History of fatty liver noted on CT angio chest last year.  Patient states his LFTs have been elevated a couple times in the past but would resolve with change of medications.  Has occasional alcohol on the weekends only.   Current Outpatient Medications  Medication Sig Dispense Refill   acetaminophen (TYLENOL) 500 MG tablet Take 1,000 mg by mouth as needed. For pain     aspirin EC 81 MG tablet Take 81 mg by mouth daily.       augmented betamethasone dipropionate (DIPROLENE-AF) 0.05 % cream Apply topically as needed.     clopidogrel (PLAVIX) 75 MG tablet TAKE 1 TABLET BY MOUTH EVERY DAY 90 tablet 3   escitalopram (LEXAPRO) 10 MG tablet Take 10 mg by mouth daily.       hydrochlorothiazide (HYDRODIURIL) 25 MG tablet Take 25 mg by mouth daily.     metFORMIN (GLUCOPHAGE) 500 MG tablet Take 500 mg by mouth 2 (two) times daily.       metoprolol (LOPRESSOR) 50 MG tablet Take 50 mg by mouth 2 (two) times daily.      nitroGLYCERIN (NITROSTAT) 0.4 MG SL tablet Place 1 tablet (0.4 mg total) under the tongue every 5 (five) minutes as needed for chest pain. Max 3 dose 25 tablet 2   olmesartan (BENICAR) 40 MG tablet Take 40 mg by mouth daily.     rosuvastatin (CRESTOR) 10 MG tablet  Take 1 tablet (10 mg total) by mouth daily. 90 tablet 3   No current facility-administered medications for this visit.    Past Medical History:  Diagnosis Date   Anxiety    CAD (coronary artery disease)    Diabetes mellitus    Hepatic steatosis    Hypercholesteremia    Hypertension    Myocardial infarction [facility]) 10/18/2003    Past Surgical History:  Procedure Laterality Date   APPENDECTOMY     CARDIAC CATHETERIZATION  01/25/2004   Cypher DES (3.5x54m) for PPMI to RCA (Dr. TCorky Downs   CARDIAC CATHETERIZATION  01/03/2005   patent stent with noncritical 30% Cfx disease and 30% LAD disease (Dr. JJackie Plum   COLONOSCOPY  09/06/2011   Procedure: COLONOSCOPY;  Surgeon: MJamesetta So  Location: [facility];  Service: Gastroenterology;  Laterality: N/A;   NM MYOCAR PERF WALL MOTION  11/2010   bruce myoview; normal pattern of perfusion in all regions, post-stress EF 62%; normal, low risk scan with improved perfusion from previous study   TRANSTHORACIC ECHOCARDIOGRAM  12/2011   EF=>55%, mild conc LVH; LA mildly dilated; trace MR & TR with normal RSVP; trace pulm valve regurg    Family History  Problem Relation Age of Onset   Cancer Mother    Heart disease Mother    Heart disease Maternal Grandmother    Diabetes Paternal Grandmother    Sudden death Paternal Grandmother    Colon cancer Neg Hx     Social History   Socioeconomic History   Marital status: Married    Spouse name: Not on file   Number of children: 4   Years of education: Not on file   Highest education level: Not on file  Occupational History   Occupation: truck dEducation administrator [employer]  Tobacco Use   Smoking status: Every Day    Packs/day: 1.50    Years: 35.00    Pack years: 52.50    Types: Cigarettes   Smokeless tobacco: Never   Tobacco comments:    1-1.5 pdd (11/20/13)  Substance and Sexual Activity   Alcohol use: Yes    Alcohol/week: 0.0 standard drinks    Comment: occasionally on weekends   Drug use: No   Sexual activity: Yes  Other Topics Concern   Not on file  Social History Narrative   Not on file   Social Determinants of Health   Financial Resource Strain: Not on file  Food Insecurity: Not on file  Transportation Needs: Not on file  Physical Activity: Not on file  Stress: Not on file  Social Connections: Not on file  Intimate Partner Violence: Not on file      ROS:  General: Negative for anorexia, weight loss, fever, chills, fatigue, weakness. Eyes: Negative for vision changes.  ENT: Negative for hoarseness, difficulty swallowing , nasal congestion. CV: Negative  for chest pain, angina, palpitations, dyspnea on exertion, peripheral edema.  Respiratory: Negative for dyspnea at rest, dyspnea on exertion, cough, sputum, wheezing.  GI: See history of present illness. GU:  Negative for dysuria, hematuria, urinary incontinence, urinary frequency, nocturnal urination.  MS: Negative for joint pain, low back pain.  Derm: Negative for rash or itching.  Neuro: Negative for weakness, abnormal sensation, seizure, frequent headaches, memory loss, confusion.  Psych: Negative for anxiety, depression, suicidal ideation, hallucinations.  Endo: Negative for unusual weight change.  Heme: Negative for bruising or bleeding. Allergy: Negative for rash or hives.   Observations/Objective: Looks well.  Good color.  No shortness of breath.  Alert and oriented.  Lab Results  Component Value Date   CREATININE 1.22 01/30/2021   BUN 24 (H) 01/30/2021   NA 139 01/30/2021   K 3.3 (L) 01/30/2021   CL 102 01/30/2021   CO2 24 01/30/2021   Lab Results  Component Value Date   ALT 71 (H) 01/30/2021   AST 47 (H) 01/30/2021   ALKPHOS 55 01/30/2021   BILITOT 0.4 01/30/2021   Lab Results  Component Value Date   WBC 6.0 01/30/2021   HGB 15.7 01/30/2021   HCT 44.0 01/30/2021   MCV 89.2 01/30/2021   PLT 246 01/30/2021    Assessment and Plan:  Colon cancer screening: Patient due for 10-year screening colonoscopy.  No known family history of colon cancer.  No personal history of adenomatous colon polyps.  Denies any GI complaints. We will make arrangements for colonoscopy with Dr. Gala Romney. ASA 3.  I have discussed the risks, alternatives, benefits with regards to but not limited to the risk of reaction to medication, bleeding, infection, perforation and the patient is agreeable to proceed. Written consent to be obtained.   Transaminitis: Noted in April 2022 when he was in the ED with illness/pneumonia.  CTA chest completed, noted to have hepatic steatosis.  Obtain labs from  January 2023 from PCP.  Patient believes his LFTs were normal at that time.  Discussed need to have labs checked periodically.  Would consider hepatitis C screening if he has never had that done.  If LFTs remain elevated, would consider further work-up.  Recommend limited alcohol consumption.  Remain physically active.  Controlled diabetes. Hand out provided.   Follow Up Instructions:    I discussed the assessment and treatment plan with the patient. The patient was provided an opportunity to ask questions and all were answered. The patient agreed with the plan and demonstrated an understanding of the instructions. AVS mailed to patient's home address.   The patient was advised to call back or seek an in-person evaluation if the symptoms worsen or if the condition fails to improve as anticipated.  I provided 8 minutes of virtual face-to-face time during this encounter.   Neil Crouch, PA-C

## 2021-12-22 ENCOUNTER — Telehealth: Payer: Self-pay | Admitting: *Deleted

## 2021-12-22 ENCOUNTER — Encounter: Payer: Self-pay | Admitting: Gastroenterology

## 2021-12-22 ENCOUNTER — Other Ambulatory Visit: Payer: Self-pay

## 2021-12-22 ENCOUNTER — Telehealth (INDEPENDENT_AMBULATORY_CARE_PROVIDER_SITE_OTHER): Payer: BC Managed Care – PPO | Admitting: Gastroenterology

## 2021-12-22 DIAGNOSIS — Z1211 Encounter for screening for malignant neoplasm of colon: Secondary | ICD-10-CM | POA: Diagnosis not present

## 2021-12-22 DIAGNOSIS — K760 Fatty (change of) liver, not elsewhere classified: Secondary | ICD-10-CM

## 2021-12-22 NOTE — Telephone Encounter (Signed)
Nathan [name], you are scheduled for a virtual visit with your provider today.  Just as we do with appointments in the office, we must obtain your consent to participate.  Your consent will be active for this visit and any virtual visit you may have with one of our providers in the next 365 days.  If you have a MyChart account, I can also send a copy of this consent to you electronically.  All virtual visits are billed to your insurance company just like a traditional visit in the office.  As this is a virtual visit, video technology does not allow for your provider to perform a traditional examination.  This may limit your provider's ability to fully assess your condition.  If your provider identifies any concerns that need to be evaluated in person or the need to arrange testing such as labs, EKG, etc, we will make arrangements to do so.  Although advances in technology are sophisticated, we cannot ensure that it will always work on either your end or our end.  If the connection with a video visit is poor, we may have to switch to a telephone visit.  With either a video or telephone visit, we are not always able to ensure that we have a secure connection.   I need to obtain your verbal consent now.   Are you willing to proceed with your visit today?  ?

## 2021-12-22 NOTE — Telephone Encounter (Signed)
Pt consented to a virtual visit. 

## 2021-12-22 NOTE — Patient Instructions (Signed)
Colonoscopy to be scheduled. See separate instructions.  ?Regarding elevated liver labs/fatty liver: recommend your labs to be checked once or twice yearly. If liver labs remain elevated, would recommend further work up. For fatty liver, this can be seen with diabetes, high cholesterol, obesity, alcohol use, or "bad genes" passed on by parents. It is important to limit alcohol use, eat healthy low fat diet, stay physically active (try to walk consistently without stopping for 30 minutes daily), control diabetes. Fatty liver can lead to scarring of the liver (cirrhosis).  ? ?Fatty Liver Disease ?The liver converts food into energy, removes toxic material from the blood, makes important proteins, and absorbs necessary vitamins from food. Fatty liver disease occurs when too much fat has built up in your liver cells. Fatty liver disease is also called hepatic steatosis. ?In many cases, fatty liver disease does not cause symptoms or problems. It is often diagnosed when tests are being done for other reasons. However, over time, fatty liver can cause inflammation that may lead to more serious liver problems, such as scarring of the liver (cirrhosis) and liver failure. ?Fatty liver is associated with insulin resistance, increased body fat, high blood pressure (hypertension), and high cholesterol. These are features of metabolic syndrome and increase your risk for stroke, diabetes, and heart disease. ?What are the causes? ?This condition may be caused by components of metabolic syndrome: ?Obesity. ?Insulin resistance. ?High cholesterol. ?Other causes: ?Alcohol abuse. ?Poor nutrition. ?Cushing syndrome. ?Pregnancy. ?Certain drugs. ?Poisons. ?Some viral infections. ?What increases the risk? ?You are more likely to develop this condition if you: ?Abuse alcohol. ?Are overweight. ?Have diabetes. ?Have hepatitis. ?Have a high triglyceride level. ?Are pregnant. ?What are the signs or symptoms? ?Fatty liver disease often does not  cause symptoms. If symptoms do develop, they can include: ?Fatigue and weakness. ?Weight loss. ?Confusion. ?Nausea, vomiting, or abdominal pain. ?Yellowing of your skin and the white parts of your eyes (jaundice). ?Itchy skin. ?How is this diagnosed? ?This condition may be diagnosed by: ?A physical exam and your medical history. ?Blood tests. ?Imaging tests, such as an ultrasound, CT scan, or MRI. ?A liver biopsy. A small sample of liver tissue is removed using a needle. The sample is then looked at under a microscope. ?How is this treated? ?Fatty liver disease is often caused by other health conditions. Treatment for fatty liver may involve medicines and lifestyle changes to manage conditions such as: ?Alcoholism. ?High cholesterol. ?Diabetes. ?Being overweight or obese. ?Follow these instructions at home: ? ?Do not drink alcohol. If you have trouble quitting, ask your health care provider how to safely quit with the help of medicine or a supervised program. This is important to keep your condition from getting worse. ?Eat a healthy diet as told by your health care provider. Ask your health care provider about working with a dietitian to develop an eating plan. ?Exercise regularly. This can help you lose weight and control your cholesterol and diabetes. Talk to your health care provider about an exercise plan and which activities are best for you. ?Take over-the-counter and prescription medicines only as told by your health care provider. ?Keep all follow-up visits. This is important. ?Contact a health care provider if: ?You have trouble controlling your: ?Blood sugar. This is especially important if you have diabetes. ?Cholesterol. ?Drinking of alcohol. ?Get help right away if: ?You have abdominal pain. ?You have jaundice. ?You have nausea and are vomiting. ?You vomit blood or material that looks like coffee grounds. ?You have stools that are  black, tar-like, or bloody. ?Summary ?Fatty liver disease develops when  too much fat builds up in the cells of your liver. ?Fatty liver disease often causes no symptoms or problems. However, over time, fatty liver can cause inflammation that may lead to more serious liver problems, such as scarring of the liver (cirrhosis). ?You are more likely to develop this condition if you abuse alcohol, are pregnant, are overweight, have diabetes, have hepatitis, or have high triglyceride or cholesterol levels. ?Contact your health care provider if you have trouble controlling your blood sugar, cholesterol, or drinking of alcohol. ?This information is not intended to replace advice given to you by your health care provider. Make sure you discuss any questions you have with your health care provider. ?Document Revised: 07/16/2020 Document Reviewed: 07/16/2020 ?Elsevier Patient Education ? [place]. ? ?

## 2021-12-23 ENCOUNTER — Other Ambulatory Visit: Payer: Self-pay

## 2021-12-23 ENCOUNTER — Telehealth: Payer: Self-pay

## 2021-12-23 ENCOUNTER — Ambulatory Visit: Payer: BC Managed Care – PPO

## 2021-12-23 MED ORDER — PEG 3350-KCL-NA BICARB-NACL 420 G PO SOLR: 4000.0000 mL | ORAL | 0 refills | Status: DC

## 2021-12-23 NOTE — Telephone Encounter (Signed)
Pre-op appt 01/18/22. Appt letter mailed with procedure instructions. ?

## 2021-12-23 NOTE — Telephone Encounter (Signed)
Called pt, TCS w/Propofol ASA 3 w/Dr. Gala Romney scheduled for 01/20/22 at 9:15am. Rx for prep sent to pharmacy. Orders entered. ?

## 2022-01-17 NOTE — Patient Instructions (Signed)
? ? ? ? ? ? Nathan [name] ? 01/17/2022  ?  ? '@PREFPERIOPPHARMACY'$ @ ? ? Your procedure is scheduled on  01/20/2022. ? ? Report to [facility] at  0730  A.M. ? ? Call this number if you have problems the morning of surgery: ? [phone] ? ? Remember: ? Follow the diet and prep instructions given to you by the office. ?  ? Take these medicines the morning of surgery with A SIP OF WATER  ? ?metoprolol. ?  ? Do not wear jewelry, make-up or nail polish. ? Do not wear lotions, powders, or perfumes, or deodorant. ? Do not shave 48 hours prior to surgery.  Men may shave face and neck. ? Do not bring valuables to the hospital. ? [facility] is not responsible for any belongings or valuables. ? ?Contacts, dentures or bridgework may not be worn into surgery.  Leave your suitcase in the car.  After surgery it may be brought to your room. ? ?For patients admitted to the hospital, discharge time will be determined by your treatment team. ? ?Patients discharged the day of surgery will not be allowed to drive home and must have someone with them for 24 hours.  ? ? ?Special instructions:   DO NOT smoke tobacco or vape for 24 hours before your procedure. ? ?Please read over the following fact sheets that you were given. ?Anesthesia Post-op Instructions and Care and Recovery After Surgery ?  ? ? ? Colonoscopy, Adult, Care After ?This sheet gives you information about how to care for yourself after your procedure. Your health care provider may also give you more specific instructions. If you have problems or questions, contact your health care provider. ?What can I expect after the procedure? ?After the procedure, it is common to have: ?A small amount of blood in your stool for 24 hours after the procedure. ?Some gas. ?Mild cramping or bloating of your abdomen. ?Follow these instructions at home: ?Eating and drinking ? ?Drink enough fluid to keep your urine pale yellow. ?Follow instructions from your health care provider about eating or  drinking restrictions. ?Resume your normal diet as instructed by your health care provider. Avoid heavy or fried foods that are hard to digest. ?Activity ?Rest as told by your health care provider. ?Avoid sitting for a long time without moving. Get up to take short walks every 1-2 hours. This is important to improve blood flow and breathing. Ask for help if you feel weak or unsteady. ?Return to your normal activities as told by your health care provider. Ask your health care provider what activities are safe for you. ?Managing cramping and bloating ? ?Try walking around when you have cramps or feel bloated. ?Apply heat to your abdomen as told by your health care provider. Use the heat source that your health care provider recommends, such as a moist heat pack or a heating pad. ?Place a towel between your skin and the heat source. ?Leave the heat on for 20-30 minutes. ?Remove the heat if your skin turns bright red. This is especially important if you are unable to feel pain, heat, or cold. You may have a greater risk of getting burned. ?General instructions ?If you were given a sedative during the procedure, it can affect you for several hours. Do not drive or operate machinery until your health care provider says that it is safe. ?For the first 24 hours after the procedure: ?Do not sign important documents. ?Do not drink alcohol. ?Do your regular  daily activities at a slower pace than normal. ?Eat soft foods that are easy to digest. ?Take over-the-counter and prescription medicines only as told by your health care provider. ?Keep all follow-up visits as told by your health care provider. This is important. ?Contact a health care provider if: ?You have blood in your stool 2-3 days after the procedure. ?Get help right away if you have: ?More than a small spotting of blood in your stool. ?Large blood clots in your stool. ?Swelling of your abdomen. ?Nausea or vomiting. ?A fever. ?Increasing pain in your abdomen that is  not relieved with medicine. ?Summary ?After the procedure, it is common to have a small amount of blood in your stool. You may also have mild cramping and bloating of your abdomen. ?If you were given a sedative during the procedure, it can affect you for several hours. Do not drive or operate machinery until your health care provider says that it is safe. ?Get help right away if you have a lot of blood in your stool, nausea or vomiting, a fever, or increased pain in your abdomen. ?This information is not intended to replace advice given to you by your health care provider. Make sure you discuss any questions you have with your health care provider. ?Document Revised: 08/09/2019 Document Reviewed: 04/29/2019 ?Elsevier Patient Education ? [place]. ?Monitored Anesthesia Care, Care After ?This sheet gives you information about how to care for yourself after your procedure. Your health care provider may also give you more specific instructions. If you have problems or questions, contact your health care provider. ?What can I expect after the procedure? ?After the procedure, it is common to have: ?Tiredness. ?Forgetfulness about what happened after the procedure. ?Impaired judgment for important decisions. ?Nausea or vomiting. ?Some difficulty with balance. ?Follow these instructions at home: ?For the time period you were told by your health care provider: ?  ?Rest as needed. ?Do not participate in activities where you could fall or become injured. ?Do not drive or use machinery. ?Do not drink alcohol. ?Do not take sleeping pills or medicines that cause drowsiness. ?Do not make important decisions or sign legal documents. ?Do not take care of children on your own. ?Eating and drinking ?Follow the diet that is recommended by your health care provider. ?Drink enough fluid to keep your urine pale yellow. ?If you vomit: ?Drink water, juice, or soup when you can drink without vomiting. ?Make sure you have little or  no nausea before eating solid foods. ?General instructions ?Have a responsible adult stay with you for the time you are told. It is important to have someone help care for you until you are awake and alert. ?Take over-the-counter and prescription medicines only as told by your health care provider. ?If you have sleep apnea, surgery and certain medicines can increase your risk for breathing problems. Follow instructions from your health care provider about wearing your sleep device: ?Anytime you are sleeping, including during daytime naps. ?While taking prescription pain medicines, sleeping medicines, or medicines that make you drowsy. ?Avoid smoking. ?Keep all follow-up visits as told by your health care provider. This is important. ?Contact a health care provider if: ?You keep feeling nauseous or you keep vomiting. ?You feel light-headed. ?You are still sleepy or having trouble with balance after 24 hours. ?You develop a rash. ?You have a fever. ?You have redness or swelling around the IV site. ?Get help right away if: ?You have trouble breathing. ?You have new-onset confusion at home. ?Summary ?  For several hours after your procedure, you may feel tired. You may also be forgetful and have poor judgment. ?Have a responsible adult stay with you for the time you are told. It is important to have someone help care for you until you are awake and alert. ?Rest as told. Do not drive or operate machinery. Do not drink alcohol or take sleeping pills. ?Get help right away if you have trouble breathing, or if you suddenly become confused. ?This information is not intended to replace advice given to you by your health care provider. Make sure you discuss any questions you have with your health care provider. ?Document Revised: 06/18/2020 Document Reviewed: 09/05/2019 ?Elsevier Patient Education ? [place]. ? ?

## 2022-01-18 ENCOUNTER — Other Ambulatory Visit: Payer: Self-pay

## 2022-01-18 ENCOUNTER — Encounter (HOSPITAL_COMMUNITY): Payer: Self-pay

## 2022-01-18 ENCOUNTER — Encounter (HOSPITAL_COMMUNITY)
Admission: RE | Admit: 2022-01-18 | Discharge: 2022-01-18 | Disposition: A | Discharge status: Home / Self Care | Payer: BC Managed Care – PPO | Source: Ambulatory Visit | Attending: Internal Medicine | Admitting: Internal Medicine

## 2022-01-18 VITALS — BP 102/62 | HR 68 | Temp 97.8°F | Resp 18 | Ht 66.0 in | Wt 180.0 lb

## 2022-01-18 DIAGNOSIS — E119 Type 2 diabetes mellitus without complications: Secondary | ICD-10-CM | POA: Insufficient documentation

## 2022-01-18 DIAGNOSIS — Z7984 Long term (current) use of oral hypoglycemic drugs: Secondary | ICD-10-CM | POA: Diagnosis not present

## 2022-01-18 DIAGNOSIS — I252 Old myocardial infarction: Secondary | ICD-10-CM | POA: Diagnosis not present

## 2022-01-18 DIAGNOSIS — Z79899 Other long term (current) drug therapy: Secondary | ICD-10-CM | POA: Diagnosis not present

## 2022-01-18 DIAGNOSIS — I10 Essential (primary) hypertension: Secondary | ICD-10-CM | POA: Diagnosis not present

## 2022-01-18 DIAGNOSIS — Z01812 Encounter for preprocedural laboratory examination: Secondary | ICD-10-CM | POA: Insufficient documentation

## 2022-01-18 DIAGNOSIS — F17210 Nicotine dependence, cigarettes, uncomplicated: Secondary | ICD-10-CM | POA: Diagnosis not present

## 2022-01-18 DIAGNOSIS — Z1211 Encounter for screening for malignant neoplasm of colon: Secondary | ICD-10-CM | POA: Diagnosis not present

## 2022-01-18 DIAGNOSIS — Z955 Presence of coronary angioplasty implant and graft: Secondary | ICD-10-CM | POA: Diagnosis not present

## 2022-01-18 DIAGNOSIS — I2510 Atherosclerotic heart disease of native coronary artery without angina pectoris: Secondary | ICD-10-CM | POA: Diagnosis not present

## 2022-01-18 DIAGNOSIS — Z86010 Personal history of colonic polyps: Secondary | ICD-10-CM | POA: Diagnosis not present

## 2022-01-18 DIAGNOSIS — F419 Anxiety disorder, unspecified: Secondary | ICD-10-CM | POA: Diagnosis not present

## 2022-01-18 LAB — BASIC METABOLIC PANEL
Anion gap: 10 (ref 5–15)
BUN: 20 mg/dL (ref 6–20)
CO2: 27 mmol/L (ref 22–32)
Calcium: 8.9 mg/dL (ref 8.9–10.3)
Chloride: 101 mmol/L (ref 98–111)
Creatinine, Ser: 1.03 mg/dL (ref 0.61–1.24)
GFR, Estimated: >60 mL/min (ref >60)
Glucose, Bld: 152 mg/dL — ABNORMAL HIGH (ref 70–99)
Potassium: 3.5 mmol/L (ref 3.5–5.1)
Sodium: 138 mmol/L (ref 135–145)

## 2022-01-20 ENCOUNTER — Encounter (HOSPITAL_COMMUNITY): Payer: Self-pay | Admitting: Internal Medicine

## 2022-01-20 ENCOUNTER — Encounter (HOSPITAL_COMMUNITY)
Admission: RE | Disposition: A | Discharge status: Home / Self Care | Payer: Self-pay | Source: Home / Self Care | Attending: Internal Medicine

## 2022-01-20 ENCOUNTER — Ambulatory Visit (HOSPITAL_COMMUNITY): Payer: BC Managed Care – PPO | Admitting: Anesthesiology

## 2022-01-20 ENCOUNTER — Ambulatory Visit (HOSPITAL_COMMUNITY)
Admission: RE | Admit: 2022-01-20 | Discharge: 2022-01-20 | Disposition: A | Discharge status: Home / Self Care | Payer: BC Managed Care – PPO | Attending: Internal Medicine | Admitting: Internal Medicine

## 2022-01-20 DIAGNOSIS — F17210 Nicotine dependence, cigarettes, uncomplicated: Secondary | ICD-10-CM | POA: Diagnosis not present

## 2022-01-20 DIAGNOSIS — E119 Type 2 diabetes mellitus without complications: Secondary | ICD-10-CM | POA: Diagnosis not present

## 2022-01-20 DIAGNOSIS — Z955 Presence of coronary angioplasty implant and graft: Secondary | ICD-10-CM | POA: Diagnosis not present

## 2022-01-20 DIAGNOSIS — Z7984 Long term (current) use of oral hypoglycemic drugs: Secondary | ICD-10-CM | POA: Diagnosis not present

## 2022-01-20 DIAGNOSIS — I252 Old myocardial infarction: Secondary | ICD-10-CM | POA: Insufficient documentation

## 2022-01-20 DIAGNOSIS — I10 Essential (primary) hypertension: Secondary | ICD-10-CM | POA: Diagnosis not present

## 2022-01-20 DIAGNOSIS — I2510 Atherosclerotic heart disease of native coronary artery without angina pectoris: Secondary | ICD-10-CM | POA: Diagnosis not present

## 2022-01-20 DIAGNOSIS — Z1211 Encounter for screening for malignant neoplasm of colon: Secondary | ICD-10-CM | POA: Diagnosis not present

## 2022-01-20 DIAGNOSIS — Z79899 Other long term (current) drug therapy: Secondary | ICD-10-CM | POA: Diagnosis not present

## 2022-01-20 DIAGNOSIS — F419 Anxiety disorder, unspecified: Secondary | ICD-10-CM | POA: Insufficient documentation

## 2022-01-20 DIAGNOSIS — Z86010 Personal history of colonic polyps: Secondary | ICD-10-CM | POA: Insufficient documentation

## 2022-01-20 HISTORY — PX: COLONOSCOPY WITH PROPOFOL: SHX5780

## 2022-01-20 LAB — GLUCOSE, CAPILLARY: Glucose-Capillary: 127 mg/dL — ABNORMAL HIGH (ref 70–99)

## 2022-01-20 SURGERY — COLONOSCOPY WITH PROPOFOL
Anesthesia: General

## 2022-01-20 MED ORDER — PROPOFOL 10 MG/ML IV BOLUS
INTRAVENOUS | Status: DC | PRN
  Administered 2022-01-20: 100 mg via INTRAVENOUS

## 2022-01-20 MED ORDER — LIDOCAINE HCL 1 % IJ SOLN
INTRAMUSCULAR | Status: DC | PRN
  Administered 2022-01-20: 50 mg via INTRADERMAL

## 2022-01-20 MED ORDER — STERILE WATER FOR IRRIGATION IR SOLN
Status: DC | PRN
  Administered 2022-01-20: .6 mL

## 2022-01-20 MED ORDER — PROPOFOL 500 MG/50ML IV EMUL
INTRAVENOUS | Status: DC | PRN
  Administered 2022-01-20: 150 ug/kg/min via INTRAVENOUS

## 2022-01-20 MED ORDER — LACTATED RINGERS IV SOLN: INTRAVENOUS | Status: DC

## 2022-01-20 NOTE — Discharge Instructions (Signed)
?  Colonoscopy ?Discharge Instructions ? ?Read the instructions outlined below and refer to this sheet in the next few weeks. These discharge instructions provide you with general information on caring for yourself after you leave the hospital. Your doctor may also give you specific instructions. While your treatment has been planned according to the most current medical practices available, unavoidable complications occasionally occur. If you have any problems or questions after discharge, call Dr. Gala Romney at [phone]. ?ACTIVITY ?You may resume your regular activity, but move at a slower pace for the next 24 hours.  ?Take frequent rest periods for the next 24 hours.  ?Walking will help get rid of the air and reduce the bloated feeling in your belly (abdomen).  ?No driving for 24 hours (because of the medicine (anesthesia) used during the test).   ?Do not sign any important legal documents or operate any machinery for 24 hours (because of the anesthesia used during the test).  ?NUTRITION ?Drink plenty of fluids.  ?You may resume your normal diet as instructed by your doctor.  ?Begin with a light meal and progress to your normal diet. Heavy or fried foods are harder to digest and may make you feel sick to your stomach (nauseated).  ?Avoid alcoholic beverages for 24 hours or as instructed.  ?MEDICATIONS ?You may resume your normal medications unless your doctor tells you otherwise.  ?WHAT YOU CAN EXPECT TODAY ?Some feelings of bloating in the abdomen.  ?Passage of more gas than usual.  ?Spotting of blood in your stool or on the toilet paper.  ?IF YOU HAD POLYPS REMOVED DURING THE COLONOSCOPY: ?No aspirin products for 7 days or as instructed.  ?No alcohol for 7 days or as instructed.  ?Eat a soft diet for the next 24 hours.  ?FINDING OUT THE RESULTS OF YOUR TEST ?Not all test results are available during your visit. If your test results are not back during the visit, make an appointment with your caregiver to find out the  results. Do not assume everything is normal if you have not heard from your caregiver or the medical facility. It is important for you to follow up on all of your test results.  ?SEEK IMMEDIATE MEDICAL ATTENTION IF: ?You have more than a spotting of blood in your stool.  ?Your belly is swollen (abdominal distention).  ?You are nauseated or vomiting.  ?You have a temperature over 101.  ?You have abdominal pain or discomfort that is severe or gets worse throughout the day.   ? ? ?Your colon was entirely normal today ? ?It is recommended you return for 1 more screening colonoscopy in 10 years ? ?At patient request, I called Nathan [name] at [phone] -reviewed findings and recommendations ?

## 2022-01-20 NOTE — H&P (Signed)
$'@LOGO'J$ @ ? ? ?Primary Care Physician:  Sharilyn Sites, MD ?Primary Gastroenterologist:  Dr. Gala Romney ? ?Pre-Procedure History & Physical: ?HPI:  Nathan [name] is a 61 y.o. male is here for a screening colonoscopy.  Reported sessile polyp removed 2012 colonoscopy.  No bowel symptoms currently. ?On DAPT with aspirin and Plavix due to CAD with stent placed. ?No bowel symptoms.  No family history of colon cancer. ? ?Past Medical History:  ?Diagnosis Date  ? Anxiety   ? CAD (coronary artery disease)   ? Diabetes mellitus   ? Hepatic steatosis   ? Hypercholesteremia   ? Hypertension   ? Myocardial infarction [facility]) 10/18/2003  ? ? ?Past Surgical History:  ?Procedure Laterality Date  ? APPENDECTOMY    ? CARDIAC CATHETERIZATION  01/25/2004  ? Cypher DES (3.5x45m) for PPMI to RCA (Dr. TCorky Downs  ? CARDIAC CATHETERIZATION  01/03/2005  ? patent stent with noncritical 30% Cfx disease and 30% LAD disease (Dr. JJackie Plum  ? COLONOSCOPY  09/06/2011  ? Procedure: COLONOSCOPY;  Surgeon: MJamesetta So  Location: [facility];  Service: Gastroenterology;  Laterality: N/A;  ? NM MYOCAR PERF WALL MOTION  11/2010  ? bruce myoview; normal pattern of perfusion in all regions, post-stress EF 62%; normal, low risk scan with improved perfusion from previous study  ? TRANSTHORACIC ECHOCARDIOGRAM  12/2011  ? EF=>55%, mild conc LVH; LA mildly dilated; trace MR & TR with normal RSVP; trace pulm valve regurg  ? ? ?Prior to Admission medications   ?Medication Sig Start Date End Date Taking? Authorizing Provider  ?acetaminophen (TYLENOL) 500 MG tablet Take 1,000 mg by mouth as needed. For pain   Yes [provider]  ?aspirin EC 81 MG tablet Take 81 mg by mouth at bedtime.   Yes [provider]  ?augmented betamethasone dipropionate (DIPROLENE-AF) 0.05 % cream Apply 1 application. topically 2 (two) times daily as needed (skin itching/irritation.). 04/05/21  Yes [provider]  ?clopidogrel (PLAVIX) 75 MG tablet TAKE 1 TABLET BY  MOUTH EVERY DAY ?Patient taking differently: Take 75 mg by mouth at bedtime. 10/08/20  Yes Hilty, KNadean Corwin MD  ?escitalopram (LEXAPRO) 10 MG tablet Take 10 mg by mouth at bedtime.   Yes [provider]  ?hydrochlorothiazide (HYDRODIURIL) 25 MG tablet Take 25 mg by mouth in the morning.   Yes [provider]  ?metFORMIN (GLUCOPHAGE) 1000 MG tablet Take 500 mg by mouth in the morning and at bedtime. 09/16/21  Yes [provider]  ?metoprolol (LOPRESSOR) 50 MG tablet Take 50 mg by mouth 2 (two) times daily.    Yes [provider]  ?nitroGLYCERIN (NITROSTAT) 0.4 MG SL tablet Place 1 tablet (0.4 mg total) under the tongue every 5 (five) minutes as needed for chest pain. Max 3 dose 11/07/16 01/14/24 Yes Hilty, KNadean Corwin MD  ?olmesartan (BENICAR) 40 MG tablet Take 40 mg by mouth in the morning. 04/25/21  Yes [provider]  ?rosuvastatin (CRESTOR) 10 MG tablet Take 1 tablet (10 mg total) by mouth daily. 07/07/20  Yes Hilty, KNadean Corwin MD  ?polyethylene glycol-electrolytes (TRILYTE) 420 g solution Take 4,000 mLs by mouth as directed. 12/23/21   Adanely Reynoso, RCristopher Estimable MD  ? ? ?Allergies as of 12/23/2021  ? (No Known Allergies)  ? ? ?Family History  ?Problem Relation Age of Onset  ? Cancer Mother   ? Heart disease Mother   ? Heart disease Maternal Grandmother   ? Diabetes Paternal Grandmother   ? Sudden death Paternal  Grandmother   ? Colon cancer Neg Hx   ? ? ?Social History  ? ?Socioeconomic History  ? Marital status: Married  ?  Spouse name: Not on file  ? Number of children: 4  ? Years of education: Not on file  ? Highest education level: Not on file  ?Occupational History  ? Occupation: truck Geophysicist/field seismologist  ?  Employer: [employer]  ?Tobacco Use  ? Smoking status: Every Day  ?  Packs/day: 1.50  ?  Years: 35.00  ?  Pack years: 52.50  ?  Types: Cigarettes  ? Smokeless tobacco: Never  ? Tobacco comments:  ?  1-1.5 pdd (11/20/13)  ?Vaping Use  ? Vaping Use: Never used  ?Substance and  Sexual Activity  ? Alcohol use: Yes  ?  Alcohol/week: 0.0 standard drinks  ?  Comment: occasionally on weekends  ? Drug use: No  ? Sexual activity: Yes  ?Other Topics Concern  ? Not on file  ?Social History Narrative  ? Not on file  ? ?Social Determinants of Health  ? ?Financial Resource Strain: Not on file  ?Food Insecurity: Not on file  ?Transportation Needs: Not on file  ?Physical Activity: Not on file  ?Stress: Not on file  ?Social Connections: Not on file  ?Intimate Partner Violence: Not on file  ? ? ?Review of Systems: ?See HPI, otherwise negative ROS ? ?Physical Exam: ?BP 114/75   Pulse 60   Temp 97.6 ?F (36.4 ?C) (Oral)   Resp 18   SpO2 97%  ?General:   Alert,  Well-developed, well-nourished, pleasant and cooperative in NAD ?Lungs:  Clear throughout to auscultation.   No wheezes, crackles, or rhonchi. No acute distress. ?Heart:  Regular rate and rhythm; no murmurs, clicks, rubs,  or gallops. ?Abdomen:  Soft, nontender and nondistended. No masses, hepatosplenomegaly or hernias noted. Normal bowel sounds, without guarding, and without rebound.   ?Impression/Plan: ?Nathan [name] is now here to undergo a screening colonoscopy. ?Average risk screening examination. ? ?Risks, benefits, limitations, imponderables and alternatives regarding colonoscopy have been reviewed with the patient. Questions have been answered. All parties agreeable.  ? ? ? ?Notice:  This dictation was prepared with Dragon dictation along with smaller phrase technology. Any transcriptional errors that result from this process are unintentional and may not be corrected upon review.  ? ?

## 2022-01-20 NOTE — Op Note (Signed)
[facility] ?Patient Name: Nathan [name] ?Procedure Date: 01/20/2022 7:59 AM ?MRN: [id number] ?Date of Birth: [birth year] ?Attending MD: Norvel Richards , MD ?CSN: [id number] ?Age: 61 ?Admit Type: Outpatient ?Procedure:                Colonoscopy ?Indications:              Screening for colorectal malignant neoplasm ?Providers:                Norvel Richards, MD, Janeece Riggers, RN, Ulice Dash  ?                          Blima Singer, Technician ?Referring MD:              ?Medicines:                Propofol per Anesthesia ?Complications:            No immediate complications. ?Estimated Blood Loss:     Estimated blood loss: none. Estimated blood loss:  ?                          none. ?Procedure:                Pre-Anesthesia Assessment: ?                          - Prior to the procedure, a History and Physical  ?                          was performed, and patient medications and  ?                          allergies were reviewed. The patient's tolerance of  ?                          previous anesthesia was also reviewed. The risks  ?                          and benefits of the procedure and the sedation  ?                          options and risks were discussed with the patient.  ?                          All questions were answered, and informed consent  ?                          was obtained. Prior Anticoagulants: The patient has  ?                          taken no previous anticoagulant or antiplatelet  ?                          agents. ASA Grade Assessment: III - A patient with  ?  severe systemic disease. After reviewing the risks  ?                          and benefits, the patient was deemed in  ?                          satisfactory condition to undergo the procedure. ?                          After obtaining informed consent, the colonoscope  ?                          was passed under direct vision. Throughout the  ?                          procedure, the  patient's blood pressure, pulse, and  ?                          oxygen saturations were monitored continuously. The  ?                          [phone]) scope was introduced through the  ?                          anus and advanced to the the cecum, identified by  ?                          appendiceal orifice and ileocecal valve. The  ?                          colonoscopy was performed without difficulty. The  ?                          patient tolerated the procedure well. The quality  ?                          of the bowel preparation was adequate. ?Scope In: 8:23:40 AM ?Scope Out: 8:35:03 AM ?Scope Withdrawal Time: 0 hours 9 minutes 34 seconds  ?Total Procedure Duration: 0 hours 11 minutes 23 seconds  ?Findings: ?     The perianal and digital rectal examinations were normal. ?     The colon (entire examined portion) appeared normal. ?     The retroflexed view of the distal rectum and anal verge was normal and  ?     showed no anal or rectal abnormalities. ?Impression:               - The entire examined colon is normal. ?                          - The distal rectum and anal verge are normal on  ?                          retroflexion view. ?                          -  No specimens collected. ?Moderate Sedation: ?     Moderate (conscious) sedation was personally administered by an  ?     anesthesia professional. The following parameters were monitored: oxygen  ?     saturation, heart rate, blood pressure, respiratory rate, EKG, adequacy  ?     of pulmonary ventilation, and response to care. ?Recommendation:           - Patient has a contact number available for  ?                          emergencies. The signs and symptoms of potential  ?                          delayed complications were discussed with the  ?                          patient. Return to normal activities tomorrow.  ?                          Written discharge instructions were provided to the  ?                          patient. ?                           - Resume previous diet. ?                          - Continue present medications. ?                          - Repeat colonoscopy in 10 years for screening  ?                          purposes. ?                          - Return to GI office (date not yet determined). ?Procedure Code(s):        --- Professional --- ?                          [phone], Colonoscopy, flexible; diagnostic, including  ?                          collection of specimen(s) by brushing or washing,  ?                          when performed (separate procedure) ?Diagnosis Code(s):        --- Professional --- ?                          Z12.11, Encounter for screening for malignant  ?                          neoplasm of colon ?CPT copyright 2019 American Medical Association. All rights reserved. ?The codes documented in this report are preliminary and upon  coder review may  ?be revised to meet current compliance requirements. ?Cristopher Estimable. Randol Zumstein, MD ?Norvel Richards, MD ?01/20/2022 8:41:57 AM ?This report has been signed electronically. ?Number of Addenda: 0 ?

## 2022-01-20 NOTE — Anesthesia Postprocedure Evaluation (Signed)
Anesthesia Post Note ? ?Patient: Nathan [name] ? ?Procedure(s) Performed: COLONOSCOPY WITH PROPOFOL ? ?Patient location during evaluation: PACU ?Anesthesia Type: General ?Level of consciousness: awake and alert ?Pain management: pain level controlled ?Vital Signs Assessment: post-procedure vital signs reviewed and stable ?Respiratory status: spontaneous breathing ?Cardiovascular status: stable ?Postop Assessment: no apparent nausea or vomiting ?Anesthetic complications: no ? ? ?No notable events documented. ? ? ?Last Vitals:  ?Vitals:  ? 01/20/22 0753  ?BP: 114/75  ?Pulse: 60  ?Resp: 18  ?Temp: 36.4 ?C  ?SpO2: 97%  ?  ?Last Pain:  ?Vitals:  ? 01/20/22 0753  ?TempSrc: Oral  ?PainSc: 0-No pain  ? ? ?  ?  ?  ?  ?  ?  ? ?Tigerlily Christine ? ? ? ? ?

## 2022-01-20 NOTE — Anesthesia Preprocedure Evaluation (Signed)
Anesthesia Evaluation  ?Patient identified by MRN, date of birth, ID band ?Patient awake ? ? ? ?Reviewed: ?Allergy & Precautions, NPO status , Patient's Chart, lab work & pertinent test results, reviewed documented beta blocker date and time  ? ?Airway ?Mallampati: II ? ?TM Distance: >3 FB ?Neck ROM: Full ? ? ? Dental ? ?(+) Dental Advisory Given, Missing, Chipped,  ?  ?Pulmonary ?Current Smoker and Patient abstained from smoking.,  ?  ?Pulmonary exam normal ?breath sounds clear to auscultation ? ? ? ? ? ? Cardiovascular ?Exercise Tolerance: Good ?hypertension, Pt. on medications and Pt. on home beta blockers ?+ CAD, + Past MI and + Cardiac Stents  ?Normal cardiovascular exam ?Rhythm:Regular Rate:Normal ? ? ?  ?Neuro/Psych ?PSYCHIATRIC DISORDERS Anxiety negative neurological ROS ?   ? GI/Hepatic ?negative GI ROS, Neg liver ROS,   ?Endo/Other  ?diabetes, Well Controlled, Type 2, Oral Hypoglycemic Agents ? Renal/GU ?negative Renal ROS  ?negative genitourinary ?  ?Musculoskeletal ?negative musculoskeletal ROS ?(+)  ? Abdominal ?  ?Peds ?negative pediatric ROS ?(+)  Hematology ?negative hematology ROS ?(+)   ?Anesthesia Other Findings ? ? Reproductive/Obstetrics ?negative OB ROS ? ?  ? ? ? ? ? ? ? ? ? ? ? ? ? ?  ?  ? ? ? ? ? ? ?Anesthesia Physical ?Anesthesia Plan ? ?ASA: 3 ? ?Anesthesia Plan: General  ? ?Post-op Pain Management: Minimal or no pain anticipated  ? ?Induction: Intravenous ? ?PONV Risk Score and Plan: Propofol infusion ? ?Airway Management Planned: Nasal Cannula and Natural Airway ? ?Additional Equipment:  ? ?Intra-op Plan:  ? ?Post-operative Plan:  ? ?Informed Consent: I have reviewed the patients History and Physical, chart, labs and discussed the procedure including the risks, benefits and alternatives for the proposed anesthesia with the patient or authorized representative who has indicated his/her understanding and acceptance.  ? ? ? ?Dental advisory given ? ?Plan  Discussed with: CRNA and Surgeon ? ?Anesthesia Plan Comments:   ? ? ? ? ? ?Anesthesia Quick Evaluation ? ?

## 2022-01-20 NOTE — Transfer of Care (Signed)
Immediate Anesthesia Transfer of Care Note ? ?Patient: Nathan [name] ? ?Procedure(s) Performed: COLONOSCOPY WITH PROPOFOL ? ?Patient Location: Short Stay ? ?Anesthesia Type:General ? ?Level of Consciousness: awake ? ?Airway & Oxygen Therapy: Patient Spontanous Breathing ? ?Post-op Assessment: Report given to RN ? ?Post vital signs: Reviewed and stable ? ?Last Vitals:  ?Vitals Value Taken Time  ?BP    ?Temp    ?Pulse    ?Resp    ?SpO2    ? ? ?Last Pain:  ?Vitals:  ? 01/20/22 0753  ?TempSrc: Oral  ?PainSc: 0-No pain  ?   ? ?Patients Stated Pain Goal: 6 (01/20/22 0753) ? ?Complications: No notable events documented. ?

## 2022-01-21 ENCOUNTER — Ambulatory Visit
Admission: EM | Admit: 2022-01-21 | Discharge: 2022-01-21 | Disposition: A | Discharge status: Home / Self Care | Payer: BC Managed Care – PPO | Attending: Family Medicine | Admitting: Family Medicine

## 2022-01-21 ENCOUNTER — Encounter: Payer: Self-pay | Admitting: Emergency Medicine

## 2022-01-21 DIAGNOSIS — J22 Unspecified acute lower respiratory infection: Secondary | ICD-10-CM | POA: Diagnosis not present

## 2022-01-21 MED ORDER — PROMETHAZINE-DM 6.25-15 MG/5ML PO SYRP: 5.0000 mL | ORAL_SOLUTION | Freq: Four times a day (QID) | ORAL | 0 refills | Status: DC | PRN

## 2022-01-21 MED ORDER — AZITHROMYCIN 250 MG PO TABS
ORAL_TABLET | ORAL | 0 refills | Status: DC
Start: 2022-01-21 — End: 2022-04-25

## 2022-01-21 NOTE — ED Provider Notes (Signed)
?[place] ? ? ? ?CSN: [id number] ?Arrival date & time: 01/21/22  0940 ? ? ?  ? ?History   ?Chief Complaint ?No chief complaint on file. ? ? ?HPI ?Nathan [name] is a 61 y.o. male.  ? ?Presenting today with 2-week history of progressively worsening sore throat, productive cough, chest and nasal congestion.  Denies fever, chills, body aches, chest pain, shortness of breath, abdominal pain, nausea vomiting or diarrhea.  States wife sick with similar symptoms.  No known history of chronic pulmonary disease, seasonal allergies.  Took a weeks worth of Coricidin HBP with minimal benefit. ? ?Past Medical History:  ?Diagnosis Date  ? Anxiety   ? CAD (coronary artery disease)   ? Diabetes mellitus   ? Hepatic steatosis   ? Hypercholesteremia   ? Hypertension   ? Myocardial infarction [facility]) 10/18/2003  ? ? ?Patient Active Problem List  ? Diagnosis Date Noted  ? Hepatic steatosis   ? Encounter for screening colonoscopy   ? Obesity (BMI 30-39.9) 10/26/2015  ? CAD in native artery 11/20/2013  ? Dyslipidemia 11/20/2013  ? Essential hypertension 11/20/2013  ? Type 2 diabetes mellitus without complication, without long-term current use of insulin ([place]) 11/20/2013  ? Tobacco abuse 11/20/2013  ? Encounter for examination required by Department of Transportation (DOT) 11/20/2013  ? ?Past Surgical History:  ?Procedure Laterality Date  ? APPENDECTOMY    ? CARDIAC CATHETERIZATION  01/25/2004  ? Cypher DES (3.5x31m) for PPMI to RCA (Dr. TCorky Downs  ? CARDIAC CATHETERIZATION  01/03/2005  ? patent stent with noncritical 30% Cfx disease and 30% LAD disease (Dr. JJackie Plum  ? COLONOSCOPY  09/06/2011  ? Procedure: COLONOSCOPY;  Surgeon: MJamesetta So  Location: [facility];  Service: Gastroenterology;  Laterality: N/A;  ? NM MYOCAR PERF WALL MOTION  11/2010  ? bruce myoview; normal pattern of perfusion in all regions, post-stress EF 62%; normal, low risk scan with improved perfusion from previous study  ? TRANSTHORACIC  ECHOCARDIOGRAM  12/2011  ? EF=>55%, mild conc LVH; LA mildly dilated; trace MR & TR with normal RSVP; trace pulm valve regurg  ? ? ? ?Home Medications   ? ?Prior to Admission medications   ?Medication Sig Start Date End Date Taking? Authorizing Provider  ?azithromycin (ZITHROMAX) 250 MG tablet Take first 2 tablets together, then 1 every day until finished. 01/21/22  Yes LVolney American PA-C  ?promethazine-dextromethorphan (PROMETHAZINE-DM) 6.25-15 MG/5ML syrup Take 5 mLs by mouth 4 (four) times daily as needed. 01/21/22  Yes LVolney American PA-C  ?acetaminophen (TYLENOL) 500 MG tablet Take 1,000 mg by mouth as needed. For pain    [provider]  ?aspirin EC 81 MG tablet Take 81 mg by mouth at bedtime.    [provider]  ?augmented betamethasone dipropionate (DIPROLENE-AF) 0.05 % cream Apply 1 application. topically 2 (two) times daily as needed (skin itching/irritation.). 04/05/21   [provider]  ?clopidogrel (PLAVIX) 75 MG tablet TAKE 1 TABLET BY MOUTH EVERY DAY ?Patient taking differently: Take 75 mg by mouth at bedtime. 10/08/20   Hilty, KNadean Corwin MD  ?escitalopram (LEXAPRO) 10 MG tablet Take 10 mg by mouth at bedtime.    [provider]  ?hydrochlorothiazide (HYDRODIURIL) 25 MG tablet Take 25 mg by mouth in the morning.    [provider]  ?metFORMIN (GLUCOPHAGE) 1000 MG tablet Take 500 mg by mouth in the morning and at bedtime. 09/16/21   [provider]  ?metoprolol (LOPRESSOR) 50 MG tablet  Take 50 mg by mouth 2 (two) times daily.     [provider]  ?nitroGLYCERIN (NITROSTAT) 0.4 MG SL tablet Place 1 tablet (0.4 mg total) under the tongue every 5 (five) minutes as needed for chest pain. Max 3 dose 11/07/16 01/14/24  Hilty, Nadean Corwin, MD  ?olmesartan (BENICAR) 40 MG tablet Take 40 mg by mouth in the morning. 04/25/21   [provider]  ?polyethylene glycol-electrolytes (TRILYTE) 420 g solution Take 4,000 mLs by mouth as  directed. 12/23/21   Rourk, Cristopher Estimable, MD  ?rosuvastatin (CRESTOR) 10 MG tablet Take 1 tablet (10 mg total) by mouth daily. 07/07/20   Hilty, Nadean Corwin, MD  ? ?Family History ?Family History  ?Problem Relation Age of Onset  ? Cancer Mother   ? Heart disease Mother   ? Heart disease Maternal Grandmother   ? Diabetes Paternal Grandmother   ? Sudden death Paternal Grandmother   ? Colon cancer Neg Hx   ? ?Social History ?Social History  ? ?Tobacco Use  ? Smoking status: Every Day  ?  Packs/day: 1.50  ?  Years: 35.00  ?  Pack years: 52.50  ?  Types: Cigarettes  ? Smokeless tobacco: Never  ? Tobacco comments:  ?  1-1.5 pdd (11/20/13)  ?Vaping Use  ? Vaping Use: Never used  ?Substance Use Topics  ? Alcohol use: Yes  ?  Alcohol/week: 0.0 standard drinks  ?  Comment: occasionally on weekends  ? Drug use: No  ? ?Allergies   ?Patient has no known allergies. ? ? ?Review of Systems ?Review of Systems ?Per HPI ? ?Physical Exam ?Triage Vital Signs ?ED Triage Vitals  ?Enc Vitals Group  ?   BP 01/21/22 0947 (!) 157/81  ?   Pulse Rate 01/21/22 0947 67  ?   Resp 01/21/22 0947 18  ?   Temp 01/21/22 0947 97.7 ?F (36.5 ?C)  ?   Temp Source 01/21/22 0947 Oral  ?   SpO2 01/21/22 0947 97 %  ?   Weight --   ?   Height --   ?   Head Circumference --   ?   Peak Flow --   ?   Pain Score 01/21/22 0949 0  ?   Pain Loc --   ?   Pain Edu? --   ?   Excl. in [place]? --   ? ?No data found. ? ?Updated Vital Signs ?BP (!) 157/81 (BP Location: Right Arm)   Pulse 67   Temp 97.7 ?F (36.5 ?C) (Oral)   Resp 18   SpO2 97%  ? ?Visual Acuity ?Right Eye Distance:   ?Left Eye Distance:   ?Bilateral Distance:   ? ?Right Eye Near:   ?Left Eye Near:    ?Bilateral Near:    ? ?Physical Exam ?Vitals and nursing note reviewed.  ?Constitutional:   ?   Appearance: He is well-developed.  ?HENT:  ?   Head: Atraumatic.  ?   Right Ear: External ear normal.  ?   Left Ear: External ear normal.  ?   Nose: Congestion present.  ?   Mouth/Throat:  ?   Mouth: Mucous membranes are moist.   ?   Pharynx: Posterior oropharyngeal erythema present. No oropharyngeal exudate.  ?Eyes:  ?   Conjunctiva/sclera: Conjunctivae normal.  ?   Pupils: Pupils are equal, round, and reactive to light.  ?Cardiovascular:  ?   Rate and Rhythm: Normal rate and regular rhythm.  ?Pulmonary:  ?   Effort: Pulmonary  effort is normal. No respiratory distress.  ?   Breath sounds: No wheezing or rales.  ?Musculoskeletal:     ?   General: Normal range of motion.  ?   Cervical back: Normal range of motion and neck supple.  ?Lymphadenopathy:  ?   Cervical: No cervical adenopathy.  ?Skin: ?   General: Skin is warm and dry.  ?Neurological:  ?   Mental Status: He is alert and oriented to person, place, and time.  ?Psychiatric:     ?   Behavior: Behavior normal.  ? ? ? ?UC Treatments / Results  ?Labs ?(all labs ordered are listed, but only abnormal results are displayed) ?Labs Reviewed - No data to display ? ?EKG ? ? ?Radiology ?No results found. ? ?Procedures ?Procedures (including critical care time) ? ?Medications Ordered in UC ?Medications - No data to display ? ?Initial Impression / Assessment and Plan / UC Course  ?I have reviewed the triage vital signs and the nursing notes. ? ?Pertinent labs & imaging results that were available during my care of the patient were reviewed by me and considered in my medical decision making (see chart for details). ? ?  ? ?Given duration and worsening course, will start azithromycin, Phenergan DM in addition to supportive over-the-counter medications and home care.  Return for acutely worsening symptoms. ? ?Final Clinical Impressions(s) / UC Diagnoses  ? ?Final diagnoses:  ?Lower respiratory infection  ? ?Discharge Instructions   ?None ?  ? ?ED Prescriptions   ? ? Medication Sig Dispense Auth. Provider  ? azithromycin (ZITHROMAX) 250 MG tablet Take first 2 tablets together, then 1 every day until finished. 6 tablet Volney American, [place]  ? promethazine-dextromethorphan (PROMETHAZINE-DM)  6.25-15 MG/5ML syrup Take 5 mLs by mouth 4 (four) times daily as needed. 100 mL Volney American, [place]  ? ?  ? ?PDMP not reviewed this encounter. ?  ?Volney American, PA-C ?01/21/22 1054 ? ?

## 2022-01-21 NOTE — ED Triage Notes (Signed)
Sore throat and cough and chest congestion x 2 weeks.  Symptoms worse in morning.  Coughing up yellow sputum. ?

## 2022-01-26 ENCOUNTER — Encounter (HOSPITAL_COMMUNITY): Payer: Self-pay | Admitting: Internal Medicine

## 2022-01-27 ENCOUNTER — Telehealth: Payer: Self-pay | Admitting: Internal Medicine

## 2022-01-27 DIAGNOSIS — I2510 Atherosclerotic heart disease of native coronary artery without angina pectoris: Secondary | ICD-10-CM

## 2022-01-27 NOTE — Telephone Encounter (Signed)
Please go ahead and order the stress test. Please confirm that he needs a plain ECG stress test and not a nuclear stress test, before ordering the appropriate one. ?

## 2022-01-27 NOTE — Telephone Encounter (Signed)
Patient has a DOT physical coming up, and he said he needs a stress test ordered.  He would like for an order to be placed for it.  ?

## 2022-01-27 NOTE — Telephone Encounter (Signed)
Spoke with pt who needs stress test for DOT physical. Pt would like office visit scheduled after stress test to discuss. Will route to MD to advise.  ?

## 2022-01-28 NOTE — Telephone Encounter (Signed)
Attestation signed 

## 2022-04-04 DIAGNOSIS — B078 Other viral warts: Secondary | ICD-10-CM | POA: Diagnosis not present

## 2022-04-04 DIAGNOSIS — D225 Melanocytic nevi of trunk: Secondary | ICD-10-CM | POA: Diagnosis not present

## 2022-04-04 DIAGNOSIS — H2513 Age-related nuclear cataract, bilateral: Secondary | ICD-10-CM | POA: Diagnosis not present

## 2022-04-04 DIAGNOSIS — E119 Type 2 diabetes mellitus without complications: Secondary | ICD-10-CM | POA: Diagnosis not present

## 2022-04-04 DIAGNOSIS — X32XXXD Exposure to sunlight, subsequent encounter: Secondary | ICD-10-CM | POA: Diagnosis not present

## 2022-04-04 DIAGNOSIS — Z1283 Encounter for screening for malignant neoplasm of skin: Secondary | ICD-10-CM | POA: Diagnosis not present

## 2022-04-04 DIAGNOSIS — L570 Actinic keratosis: Secondary | ICD-10-CM | POA: Diagnosis not present

## 2022-04-04 DIAGNOSIS — Z7984 Long term (current) use of oral hypoglycemic drugs: Secondary | ICD-10-CM | POA: Diagnosis not present

## 2022-04-11 ENCOUNTER — Telehealth (HOSPITAL_COMMUNITY): Payer: Self-pay | Admitting: *Deleted

## 2022-04-18 ENCOUNTER — Ambulatory Visit (HOSPITAL_COMMUNITY): Payer: BC Managed Care – PPO | Attending: Cardiology

## 2022-04-18 DIAGNOSIS — I2510 Atherosclerotic heart disease of native coronary artery without angina pectoris: Secondary | ICD-10-CM | POA: Diagnosis not present

## 2022-04-18 LAB — MYOCARDIAL PERFUSION IMAGING
Base ST Depression (mm): 0 mm
LV dias vol: 86 mL (ref 62–150)
LV sys vol: 33 mL
Nuc Stress EF: 62 %
Peak HR: 78 {beats}/min
Rest HR: 57 {beats}/min
Rest Nuclear Isotope Dose: 10.3 mCi
SDS: 0
SRS: 0
SSS: 0
ST Depression (mm): 0 mm
Stress Nuclear Isotope Dose: 31.4 mCi
TID: 1.05

## 2022-04-18 MED ORDER — REGADENOSON 0.4 MG/5ML IV SOLN
0.4000 mg | Freq: Once | INTRAVENOUS | Status: AC
  Administered 2022-04-18: 0.4 mg via INTRAVENOUS

## 2022-04-18 MED ORDER — TECHNETIUM TC 99M TETROFOSMIN IV KIT
10.3000 | PACK | Freq: Once | INTRAVENOUS | Status: AC | PRN
  Administered 2022-04-18: 10.3 via INTRAVENOUS

## 2022-04-18 MED ORDER — TECHNETIUM TC 99M TETROFOSMIN IV KIT
31.4000 | PACK | Freq: Once | INTRAVENOUS | Status: AC | PRN
  Administered 2022-04-18: 31.4 via INTRAVENOUS

## 2022-04-21 ENCOUNTER — Telehealth: Payer: Self-pay | Admitting: Internal Medicine

## 2022-04-21 NOTE — Telephone Encounter (Signed)
Spoke to patient he stated he has appointment to see Coletta Memos NP on Mon 7/10.He will request copy of stress test.

## 2022-04-21 NOTE — Telephone Encounter (Signed)
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 He said it is on My Chart, but he can not print it off.

## 2022-04-24 NOTE — Progress Notes (Unsigned)
Cardiology Clinic Note   Patient Name: Nathan [name] Date of Encounter: 04/24/2022  Primary Care Provider:  Sharilyn Sites, MD Primary Cardiologist:  Pixie Casino, MD  Patient Profile    Nathan [name] 61 year old male presents the clinic today for follow-up evaluation status post nuclear stress test.  Past Medical History    Past Medical History:  Diagnosis Date   Anxiety    CAD (coronary artery disease)    Diabetes mellitus    Hepatic steatosis    Hypercholesteremia    Hypertension    Myocardial infarction [facility]) 10/18/2003   Past Surgical History:  Procedure Laterality Date   APPENDECTOMY     CARDIAC CATHETERIZATION  01/25/2004   Cypher DES (3.5x45m) for PPMI to RCA (Dr. TCorky Downs   CARDIAC CATHETERIZATION  01/03/2005   patent stent with noncritical 30% Cfx disease and 30% LAD disease (Dr. JJackie Plum   COLONOSCOPY  09/06/2011   Procedure: COLONOSCOPY;  Surgeon: MJamesetta So  Location[facility];  Service: Gastroenterology;  Laterality: N/A;   COLONOSCOPY WITH PROPOFOL N/A 01/20/2022   Procedure: COLONOSCOPY WITH PROPOFOL;  Surgeon: RDaneil Dolin MD;  Location: [facility];  Service: Endoscopy;  Laterality: N/A;  9:15am   NM MYOCAR PERF WALL MOTION  11/2010   bruce myoview; normal pattern of perfusion in all regions, post-stress EF 62%; normal, low risk scan with improved perfusion from previous study   TRANSTHORACIC ECHOCARDIOGRAM  12/2011   EF=>55%, mild conc LVH; LA mildly dilated; trace MR & TR with normal RSVP; trace pulm valve regurg    Allergies  No Known Allergies  History of Present Illness    Nathan VICTORYhas a PMH of inferior MI with stenting to his mid RCA in 2005.  His PMH also includes diabetes, HTN, ongoing tobacco abuse, and dyslipidemia.  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  He was seen in follow-up by Dr. HDebara Picketton 05/04/2021.  During that time he continued to do well from a cardiac  standpoint.  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  He denied chest pain and shortness of breath.  His A1c had improved from 8.1-6.0.  His triglycerides were slightly elevated at 153.  His LDL cholesterol was less than 70.  His nuclear stress test 04/18/2022 showed no ST deviation and low risk.  His EF was estimated to be 55-65%.  He presents to the clinic today for follow-up evaluation and states***  Coronary artery disease-no recent episodes of arm neck back or chest discomfort.  Nuclear stress test 04/18/2022 showed no ST deviation low risk. Continue aspirin, Plavix, metoprolol, rosuvastatin Heart healthy low-sodium diet-salty 6 given Increase physical activity as tolerated  Hyperlipidemia-LDL***. Continue rosuvastatin, aspirin, Plavix Heart healthy low-sodium high-fiber diet Increase physical activity as tolerated  Essential hypertension-BP today***.  Well-controlled at home. Continue metoprolol, olmesartan, hydrochlorothiazide Heart healthy low-sodium diet-salty 6 given Increase physical activity as tolerated  Type 2 diabetes-glucose 152 on 01/18/2022. Continue metformin Heart healthy low-sodium carb modified diet Increase physical activity as tolerated  Disposition: Follow-up with Dr. HDebara Pickettin 12 months.  Home Medications    Prior to Admission medications   Medication Sig Start Date End Date Taking? Authorizing Provider  acetaminophen (TYLENOL) 500 MG tablet Take 1,000 mg by mouth as needed. For pain    [provider]  aspirin EC 81 MG tablet Take 81 mg by mouth at bedtime.    [provider]  augmented betamethasone dipropionate (DIPROLENE-AF) 0.05 % cream Apply 1 application. topically 2 (two) times daily as needed (skin itching/irritation.). 04/05/21   [provider]  azithromycin (ZITHROMAX) 250 MG tablet Take first 2 tablets together, then 1 every day until finished. 01/21/22   Volney American, PA-C  clopidogrel (PLAVIX) 75 MG tablet TAKE 1 TABLET  BY MOUTH EVERY DAY Patient taking differently: Take 75 mg by mouth at bedtime. 10/08/20   Hilty, Nadean Corwin, MD  escitalopram (LEXAPRO) 10 MG tablet Take 10 mg by mouth at bedtime.    [provider]  hydrochlorothiazide (HYDRODIURIL) 25 MG tablet Take 25 mg by mouth in the morning.    [provider]  metFORMIN (GLUCOPHAGE) 1000 MG tablet Take 500 mg by mouth in the morning and at bedtime. 09/16/21   [provider]  metoprolol (LOPRESSOR) 50 MG tablet Take 50 mg by mouth 2 (two) times daily.     [provider]  nitroGLYCERIN (NITROSTAT) 0.4 MG SL tablet Place 1 tablet (0.4 mg total) under the tongue every 5 (five) minutes as needed for chest pain. Max 3 dose 11/07/16 01/14/24  Hilty, Nadean Corwin, MD  olmesartan (BENICAR) 40 MG tablet Take 40 mg by mouth in the morning. 04/25/21   [provider]  polyethylene glycol-electrolytes (TRILYTE) 420 g solution Take 4,000 mLs by mouth as directed. 12/23/21   Rourk, Cristopher Estimable, MD  promethazine-dextromethorphan (PROMETHAZINE-DM) 6.25-15 MG/5ML syrup Take 5 mLs by mouth 4 (four) times daily as needed. 01/21/22   Volney American, PA-C  rosuvastatin (CRESTOR) 10 MG tablet Take 1 tablet (10 mg total) by mouth daily. 07/07/20   Hilty, Nadean Corwin, MD    Family History    Family History  Problem Relation Age of Onset   Cancer Mother    Heart disease Mother    Heart disease Maternal Grandmother    Diabetes Paternal Grandmother    Sudden death Paternal Grandmother    Colon cancer Neg Hx    He indicated that his mother is deceased. He indicated that his maternal grandmother is deceased. He indicated that his maternal grandfather is deceased. He indicated that his paternal grandmother is deceased. He indicated that his paternal grandfather is deceased. He indicated that the status of his neg hx is unknown.  Social History    Social History   Socioeconomic History   Marital status: Married    Spouse name: Not on  file   Number of children: 4   Years of education: Not on file   Highest education level: Not on file  Occupational History   Occupation: truck Education administrator: [employer]  Tobacco Use   Smoking status: Every Day    Packs/day: 1.50    Years: 35.00    Total pack years: 52.50    Types: Cigarettes   Smokeless tobacco: Never   Tobacco comments:    1-1.5 pdd (11/20/13)  Vaping Use   Vaping Use: Never used  Substance and Sexual Activity   Alcohol use: Yes    Alcohol/week: 0.0 standard drinks of alcohol    Comment: occasionally on weekends   Drug use: No   Sexual activity: Yes  Other Topics Concern   Not on file  Social History Narrative   Not on file   Social Determinants of Health   Financial Resource Strain: Not on file  Food Insecurity: Not on file  Transportation Needs: Not on file  Physical Activity: Not on file  Stress: Not on  file  Social Connections: Not on file  Intimate Partner Violence: Not on file     Review of Systems    General:  No chills, fever, night sweats or weight changes.  Cardiovascular:  No chest pain, dyspnea on exertion, edema, orthopnea, palpitations, paroxysmal nocturnal dyspnea. Dermatological: No rash, lesions/masses Respiratory: No cough, dyspnea Urologic: No hematuria, dysuria Abdominal:   No nausea, vomiting, diarrhea, bright red blood per rectum, melena, or hematemesis Neurologic:  No visual changes, wkns, changes in mental status. All other systems reviewed and are otherwise negative except as noted above.  Physical Exam    VS:  There were no vitals taken for this visit. , BMI There is no height or weight on file to calculate BMI. GEN: Well nourished, well developed, in no acute distress. HEENT: normal. Neck: Supple, no JVD, carotid bruits, or masses. Cardiac: RRR, no murmurs, rubs, or gallops. No clubbing, cyanosis, edema.  Radials/DP/PT 2+ and equal bilaterally.  Respiratory:  Respirations regular and unlabored,  clear to auscultation bilaterally. GI: Soft, nontender, nondistended, BS + x 4. MS: no deformity or atrophy. Skin: warm and dry, no rash. Neuro:  Strength and sensation are intact. Psych: Normal affect.  Accessory Clinical Findings    Recent Labs: 01/18/2022: BUN 20; Creatinine, Ser 1.03; Potassium 3.5; Sodium 138   Recent Lipid Panel    Component Value Date/Time   CHOL 119 05/18/2013 1055   TRIG 171 (H) 05/18/2013 1055   HDL 30 (L) 05/18/2013 1055   CHOLHDL 4.0 05/18/2013 1055   VLDL 34 05/18/2013 1055   LDLCALC 55 05/18/2013 1055    ECG personally reviewed by me today- *** - No acute changes  Nuclear stress test 04/18/2022   Findings are consistent with prior myocardial infarction and no prior ischemia. The study is low risk.   No ST deviation was noted.   LV perfusion is abnormal. There is no evidence of ischemia. There is evidence of infarction. Defect 1: There is a small defect with moderate reduction in uptake present in the apical to mid anteroseptal location(s) that is fixed. There is normal wall motion in the defect area. Consistent with infarction. Defect 2: There is a small defect with moderate reduction in uptake present in the mid inferior location(s) that is fixed. There is normal wall motion in the defect area. Consistent with infarction.   Left ventricular function is normal. Nuclear stress EF: 62 %. The left ventricular ejection fraction is normal (55-65%). End diastolic cavity size is normal. End systolic cavity size is normal.   Prior study available for comparison from 04/24/2020. no changes  Assessment & Plan   1.  ***   Jossie Ng. Merril Nagy NP-C     04/24/2022, 1:33 PM [facility] [place] [address] Office [phone] Fax [phone]  Notice: This dictation was prepared with Dragon dictation along with smaller phrase technology. Any transcriptional errors that result from this process are unintentional and may not be  corrected upon review.  I spent***minutes examining this patient, reviewing medications, and using patient centered shared decision making involving her cardiac care.  Prior to her visit I spent greater than 20 minutes reviewing her past medical history,  medications, and prior cardiac tests.

## 2022-04-25 ENCOUNTER — Ambulatory Visit: Payer: BC Managed Care – PPO | Admitting: General Practice

## 2022-04-25 ENCOUNTER — Encounter: Payer: Self-pay | Admitting: General Practice

## 2022-04-25 VITALS — BP 116/72 | HR 63 | Ht 66.0 in | Wt 183.8 lb

## 2022-04-25 DIAGNOSIS — E118 Type 2 diabetes mellitus with unspecified complications: Secondary | ICD-10-CM | POA: Diagnosis not present

## 2022-04-25 DIAGNOSIS — E119 Type 2 diabetes mellitus without complications: Secondary | ICD-10-CM

## 2022-04-25 DIAGNOSIS — E6609 Other obesity due to excess calories: Secondary | ICD-10-CM | POA: Diagnosis not present

## 2022-04-25 DIAGNOSIS — E7800 Pure hypercholesterolemia, unspecified: Secondary | ICD-10-CM | POA: Diagnosis not present

## 2022-04-25 DIAGNOSIS — Z6829 Body mass index (BMI) 29.0-29.9, adult: Secondary | ICD-10-CM | POA: Diagnosis not present

## 2022-04-25 DIAGNOSIS — E782 Mixed hyperlipidemia: Secondary | ICD-10-CM | POA: Diagnosis not present

## 2022-04-25 DIAGNOSIS — I10 Essential (primary) hypertension: Secondary | ICD-10-CM

## 2022-04-25 DIAGNOSIS — E7849 Other hyperlipidemia: Secondary | ICD-10-CM | POA: Diagnosis not present

## 2022-04-25 DIAGNOSIS — I2510 Atherosclerotic heart disease of native coronary artery without angina pectoris: Secondary | ICD-10-CM

## 2022-04-25 MED ORDER — NITROGLYCERIN 0.4 MG SL SUBL: 0.4000 mg | SUBLINGUAL_TABLET | SUBLINGUAL | 2 refills | Status: DC | PRN

## 2022-04-25 NOTE — Patient Instructions (Signed)
Medication Instructions:  The current medical regimen is effective;  continue present plan and medications as directed. Please refer to the Current Medication list given to you today.   *If you need a refill on your cardiac medications before your next appointment, please call your pharmacy*  Lab Work:   Testing/Procedures:  NONE    NONE  If you have labs (blood work) drawn today and your tests are completely normal, you will receive your results only by: [place] (if you have MyChart) OR  A paper copy in the mail If you have any lab test that is abnormal or we need to change your treatment, we will call you to review the results.  Follow-Up: Your next appointment:  12 month(s) In Person with Pixie Casino, MD   Please call our office 2 months in advance to schedule this appointment  :1  At [facility], you and your health needs are our priority.  As part of our continuing mission to provide you with exceptional heart care, we have created designated Provider Care Teams.  These Care Teams include your primary Cardiologist (physician) and Advanced Practice Providers (APPs -  Physician Assistants and Nurse Practitioners) who all work together to provide you with the care you need, when you need it.  Important Information About Sugar

## 2022-11-07 DIAGNOSIS — X32XXXD Exposure to sunlight, subsequent encounter: Secondary | ICD-10-CM | POA: Diagnosis not present

## 2022-11-07 DIAGNOSIS — E663 Overweight: Secondary | ICD-10-CM | POA: Diagnosis not present

## 2022-11-07 DIAGNOSIS — I2510 Atherosclerotic heart disease of native coronary artery without angina pectoris: Secondary | ICD-10-CM | POA: Diagnosis not present

## 2022-11-07 DIAGNOSIS — E1165 Type 2 diabetes mellitus with hyperglycemia: Secondary | ICD-10-CM | POA: Diagnosis not present

## 2022-11-07 DIAGNOSIS — Z0000 Encounter for general adult medical examination without abnormal findings: Secondary | ICD-10-CM | POA: Diagnosis not present

## 2022-11-07 DIAGNOSIS — Z23 Encounter for immunization: Secondary | ICD-10-CM | POA: Diagnosis not present

## 2022-11-07 DIAGNOSIS — I10 Essential (primary) hypertension: Secondary | ICD-10-CM | POA: Diagnosis not present

## 2022-11-07 DIAGNOSIS — Z6829 Body mass index (BMI) 29.0-29.9, adult: Secondary | ICD-10-CM | POA: Diagnosis not present

## 2022-11-07 DIAGNOSIS — D225 Melanocytic nevi of trunk: Secondary | ICD-10-CM | POA: Diagnosis not present

## 2022-11-07 DIAGNOSIS — Z1331 Encounter for screening for depression: Secondary | ICD-10-CM | POA: Diagnosis not present

## 2022-11-07 DIAGNOSIS — E7849 Other hyperlipidemia: Secondary | ICD-10-CM | POA: Diagnosis not present

## 2022-11-07 DIAGNOSIS — E118 Type 2 diabetes mellitus with unspecified complications: Secondary | ICD-10-CM | POA: Diagnosis not present

## 2022-11-07 DIAGNOSIS — Z1283 Encounter for screening for malignant neoplasm of skin: Secondary | ICD-10-CM | POA: Diagnosis not present

## 2022-11-07 DIAGNOSIS — E782 Mixed hyperlipidemia: Secondary | ICD-10-CM | POA: Diagnosis not present

## 2022-11-07 DIAGNOSIS — L570 Actinic keratosis: Secondary | ICD-10-CM | POA: Diagnosis not present

## 2023-02-18 DIAGNOSIS — Z6829 Body mass index (BMI) 29.0-29.9, adult: Secondary | ICD-10-CM | POA: Diagnosis not present

## 2023-02-18 DIAGNOSIS — E663 Overweight: Secondary | ICD-10-CM | POA: Diagnosis not present

## 2023-02-18 DIAGNOSIS — J019 Acute sinusitis, unspecified: Secondary | ICD-10-CM | POA: Diagnosis not present

## 2023-02-18 DIAGNOSIS — W57XXXA Bitten or stung by nonvenomous insect and other nonvenomous arthropods, initial encounter: Secondary | ICD-10-CM | POA: Diagnosis not present

## 2023-03-09 DIAGNOSIS — Z6828 Body mass index (BMI) 28.0-28.9, adult: Secondary | ICD-10-CM | POA: Diagnosis not present

## 2023-03-09 DIAGNOSIS — H00014 Hordeolum externum left upper eyelid: Secondary | ICD-10-CM | POA: Diagnosis not present

## 2023-03-09 DIAGNOSIS — E663 Overweight: Secondary | ICD-10-CM | POA: Diagnosis not present

## 2023-03-09 DIAGNOSIS — R030 Elevated blood-pressure reading, without diagnosis of hypertension: Secondary | ICD-10-CM | POA: Diagnosis not present

## 2023-04-03 NOTE — Progress Notes (Signed)
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  His PMH also includes diabetes, HTN, ongoing tobacco abuse, and dyslipidemia.  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   Past Medical History    Past Medical History:  Diagnosis Date   Anxiety    CAD (coronary artery disease)    Diabetes mellitus    Hepatic steatosis    Hypercholesteremia    Hypertension    Myocardial infarction [facility]) 10/18/2003   Past Surgical History:  Procedure Laterality Date   APPENDECTOMY     CARDIAC CATHETERIZATION  01/25/2004   Cypher DES (3.5x67mm) for PPMI to RCA (Dr. Bishop [name])   CARDIAC CATHETERIZATION  01/03/2005   patent stent with noncritical 30% Cfx disease and 30% LAD disease (Dr. Evlyn [name])   COLONOSCOPY  09/06/2011   Procedure: COLONOSCOPY;  Surgeon: Nathan [name];  [facility];  Service: Gastroenterology;  Laterality: N/A;   COLONOSCOPY WITH PROPOFOL N/A 01/20/2022   Procedure: COLONOSCOPY WITH PROPOFOL;  Surgeon: Nathan Ade, MD;  Location: [facility];  Service: Endoscopy;  Laterality: N/A;  9:15am   NM MYOCAR PERF WALL MOTION  11/2010   bruce myoview; normal pattern of perfusion in all regions, post-stress EF 62%; normal, low risk scan with improved perfusion from previous study   TRANSTHORACIC ECHOCARDIOGRAM  12/2011   EF=>55%, mild conc LVH; LA mildly dilated; trace MR & TR with normal RSVP; trace pulm valve regurg    Allergies  No Known Allergies  History of Present Illness   Nathan [name] comes today without any complaints for annual follow-up.  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  He denies any chest pain, dyspnea on exertion, fatigue, or  dizziness.  Unfortunately he continues to smoke a pack a day as well as dipping.  He is medically compliant.  On his brakes during his drive he does walk around some.  He is not on any specific exercise program.  Primary care provider keeps up with labs to include cholesterol studies kidney function.  He has had a recent colonoscopy which was clear.  Home Medications    Current Outpatient Medications  Medication Sig Dispense Refill   acetaminophen (TYLENOL) 500 MG tablet Take 1,000 mg by mouth as needed. For pain     aspirin EC 81 MG tablet Take 81 mg by mouth at bedtime.     augmented betamethasone dipropionate (DIPROLENE-AF) 0.05 % cream Apply 1 application. topically 2 (two) times daily as needed (skin itching/irritation.).     clopidogrel (PLAVIX) 75 MG tablet TAKE 1 TABLET BY MOUTH EVERY DAY (Patient taking differently: Take 75 mg by mouth at bedtime.) 90 tablet 3   escitalopram (LEXAPRO) 10 MG tablet Take 10 mg by mouth at bedtime.     hydrochlorothiazide (HYDRODIURIL) 25 MG tablet Take 25 mg by mouth in the morning.     metFORMIN (GLUCOPHAGE) 1000 MG tablet Take 500 mg by mouth in the morning and at bedtime.     metoprolol (LOPRESSOR) 50 MG tablet Take 50 mg by mouth 2 (two) times daily.      nitroGLYCERIN (NITROSTAT) 0.4 MG SL tablet  Place 1 tablet (0.4 mg total) under the tongue every 5 (five) minutes as needed for chest pain. Max 3 dose 25 tablet 2   olmesartan (BENICAR) 40 MG tablet Take 40 mg by mouth in the morning.     rosuvastatin (CRESTOR) 10 MG tablet Take 1 tablet (10 mg total) by mouth daily. 90 tablet 3   No current facility-administered medications for this visit.     Family History    Family History  Problem Relation Age of Onset   Cancer Mother    Heart disease Mother    Heart disease Maternal Grandmother    Diabetes Paternal Grandmother    Sudden death Paternal Grandmother    Colon cancer Neg Hx    He indicated that his mother is deceased. He indicated that his  maternal grandmother is deceased. He indicated that his maternal grandfather is deceased. He indicated that his paternal grandmother is deceased. He indicated that his paternal grandfather is deceased. He indicated that the status of his neg hx is unknown.  Social History    Social History   Socioeconomic History   Marital status: Married    Spouse name: Not on file   Number of children: 4   Years of education: Not on file   Highest education level: Not on file  Occupational History   Occupation: truck Air traffic controller: [employer]  Tobacco Use   Smoking status: Every Day    Packs/day: 1.50    Years: 35.00    Additional pack years: 0.00    Total pack years: 52.50    Types: Cigarettes   Smokeless tobacco: Never   Tobacco comments:    1-1.5 pdd (11/20/13)  Vaping Use   Vaping Use: Never used  Substance and Sexual Activity   Alcohol use: Yes    Alcohol/week: 0.0 standard drinks of alcohol    Comment: occasionally on weekends   Drug use: No   Sexual activity: Yes  Other Topics Concern   Not on file  Social History Narrative   Not on file   Social Determinants of Health   Financial Resource Strain: Not on file  Food Insecurity: Not on file  Transportation Needs: Not on file  Physical Activity: Not on file  Stress: Not on file  Social Connections: Not on file  Intimate Partner Violence: Not on file     Review of Systems    General:  No chills, fever, night sweats or weight changes.  Cardiovascular:  No chest pain, dyspnea on exertion, edema, orthopnea, palpitations, paroxysmal nocturnal dyspnea. Dermatological: No rash, lesions/masses Respiratory: No cough, dyspnea Urologic: No hematuria, dysuria Abdominal:   No nausea, vomiting, diarrhea, bright red blood per rectum, melena, or hematemesis Neurologic:  No visual changes, wkns, changes in mental status. All other systems reviewed and are otherwise negative except as noted above.     Physical Exam     VS:  There were no vitals taken for this visit. , BMI There is no height or weight on file to calculate BMI.     GEN: Well nourished, well developed, in no acute distress. HEENT: normal. Neck: Supple, no JVD, carotid bruits, or masses. Cardiac: RRR, no murmurs, rubs, or gallops. No clubbing, cyanosis, edema.  Radials/DP/PT 2+ and equal bilaterally.  Respiratory:  Respirations regular and unlabored, clear to auscultation bilaterally. GI: Soft, nontender, nondistended, BS + x 4. MS: no deformity or atrophy. Skin: warm and dry, no rash. Neuro:  Strength and sensation are intact. Psych:  Normal affect.  Accessory Clinical Findings  EKG: Normal sinus rhythm, heart rate 62 bpm, no changes from prior EKG in January 30, 2021.  (Personally reviewed)  Lab Results  Component Value Date   WBC 6.0 01/30/2021   HGB 15.7 01/30/2021   HCT 44.0 01/30/2021   MCV 89.2 01/30/2021   PLT 246 01/30/2021   Lab Results  Component Value Date   CREATININE 1.03 01/18/2022   BUN 20 01/18/2022   NA 138 01/18/2022   K 3.5 01/18/2022   CL 101 01/18/2022   CO2 27 01/18/2022   Lab Results  Component Value Date   ALT 71 (H) 01/30/2021   AST 47 (H) 01/30/2021   ALKPHOS 55 01/30/2021   BILITOT 0.4 01/30/2021   Lab Results  Component Value Date   CHOL 119 05/18/2013   HDL 30 (L) 05/18/2013   LDLCALC 55 05/18/2013   TRIG 171 (H) 05/18/2013   CHOLHDL 4.0 05/18/2013    Lab Results  Component Value Date   HGBA1C 6.0 (H) 05/18/2013    Review of Prior Studies: Nuclear stress test 04/18/2022     Findings are consistent with prior myocardial infarction and no prior ischemia. The study is low risk.   No ST deviation was noted.   LV perfusion is abnormal. There is no evidence of ischemia. There is evidence of infarction. Defect 1: There is a small defect with moderate reduction in uptake present in the apical to mid anteroseptal location(s) that is fixed. There is normal wall motion in the defect area.  Consistent with infarction. Defect 2: There is a small defect with moderate reduction in uptake present in the mid inferior location(s) that is fixed. There is normal wall motion in the defect area. Consistent with infarction.   Left ventricular function is normal. Nuclear stress EF: 62 %. The left ventricular ejection fraction is normal (55-65%). End diastolic cavity size is normal. End systolic cavity size is normal.   Prior study available for comparison from 04/24/2020. no changes  Assessment & Plan   1.  Coronary artery disease: History of DES to the right coronary 3 in 2005.  The patient has no complaints of angina symptoms.  He remains on dual antiplatelet therapy.  Consider stopping clopidogrel on follow-up visit.  For now refills are provided.  Labs are followed by PCP.  Continue secondary prevention.  He will be scheduled for a Lexiscan Myoview in 1 year as part of DOT physical requirements.  2.  Hypertension: Excellent control of blood pressure today.  Continue olmesartan 40 mg daily and metoprolol tartrate 50 mg twice daily.  3.  Hypercholesterolemia: Remains on rosuvastatin 10 mg daily.  Labs are followed by PCP Dr. Phillips Odor.  Goal of LDL less than 70.  4.  Ongoing tobacco abuse: Unfortunately continues to smoke 1 pack a day as well as dipping  He is given smoking cessation counseling   He verbalizes understanding but is in the precontemplative stage.    Informed Consent   Shared Decision Making/Informed Consent The risks [chest pain, shortness of breath, cardiac arrhythmias, dizziness, blood pressure fluctuations, myocardial infarction, stroke/transient ischemic attack, nausea, vomiting, allergic reaction, radiation exposure, metallic taste sensation and life-threatening complications (estimated to be 1 in 10,000)], benefits (risk stratification, diagnosing coronary artery disease, treatment guidance) and alternatives of a nuclear stress test were discussed in detail with Mr. Gruenewald and  he agrees to proceed.        Signed, Bettey Mare. Liborio Nixon, ANP, AACC   04/03/2023 7:28  AM      Office [phone] Fax [phone]  Notice: This dictation was prepared with Dragon dictation along with smaller phrase technology. Any transcriptional errors that result from this process are unintentional and may not be corrected upon review.

## 2023-04-10 ENCOUNTER — Encounter: Payer: Self-pay | Admitting: Adult Health

## 2023-04-10 ENCOUNTER — Ambulatory Visit: Payer: BC Managed Care – PPO | Attending: Adult Health | Admitting: Adult Health

## 2023-04-10 VITALS — BP 120/76 | HR 62 | Ht 66.0 in | Wt 176.8 lb

## 2023-04-10 DIAGNOSIS — I10 Essential (primary) hypertension: Secondary | ICD-10-CM

## 2023-04-10 DIAGNOSIS — I2510 Atherosclerotic heart disease of native coronary artery without angina pectoris: Secondary | ICD-10-CM

## 2023-04-10 DIAGNOSIS — E7800 Pure hypercholesterolemia, unspecified: Secondary | ICD-10-CM | POA: Diagnosis not present

## 2023-04-10 DIAGNOSIS — E119 Type 2 diabetes mellitus without complications: Secondary | ICD-10-CM

## 2023-04-10 DIAGNOSIS — Z720 Tobacco use: Secondary | ICD-10-CM

## 2023-04-10 DIAGNOSIS — Z7984 Long term (current) use of oral hypoglycemic drugs: Secondary | ICD-10-CM

## 2023-04-10 MED ORDER — OLMESARTAN MEDOXOMIL 40 MG PO TABS
40.0000 mg | ORAL_TABLET | Freq: Every morning | ORAL | 3 refills | Status: DC
Start: 2023-04-10 — End: 2024-05-22

## 2023-04-10 MED ORDER — NITROGLYCERIN 0.4 MG SL SUBL: 0.4000 mg | SUBLINGUAL_TABLET | SUBLINGUAL | 2 refills | Status: DC | PRN

## 2023-04-10 MED ORDER — ROSUVASTATIN CALCIUM 10 MG PO TABS: 10.0000 mg | ORAL_TABLET | Freq: Every day | ORAL | 3 refills | Status: DC

## 2023-04-10 NOTE — Patient Instructions (Signed)
Medication Instructions:  No Changes *If you need a refill on your cardiac medications before your next appointment, please call your pharmacy*   Lab Work: No Labs If you have labs (blood work) drawn today and your tests are completely normal, you will receive your results only by: MyChart Message (if you have MyChart) OR A paper copy in the mail If you have any lab test that is abnormal or we need to change your treatment, we will call you to review the results.   Testing/Procedures: [address]. ( June 2025). Your physician has requested that you have a lexiscan myoview. For further information please visit [email or web address] Please follow instruction sheet, as given.    Follow-Up: At [facility], you and your health needs are our priority.  As part of our continuing mission to provide you with exceptional heart care, we have created designated Provider Care Teams.  These Care Teams include your primary Cardiologist (physician) and Advanced Practice Providers (APPs -  Physician Assistants and Nurse Practitioners) who all work together to provide you with the care you need, when you need it.  We recommend signing up for the patient portal called "MyChart".  Sign up information is provided on this After Visit Summary.  MyChart is used to connect with patients for Virtual Visits (Telemedicine).  Patients are able to view lab/test results, encounter notes, upcoming appointments, etc.  Non-urgent messages can be sent to your provider as well.   To learn more about what you can do with MyChart, go to [email or web address].    Your next appointment:   1 year(s)  Provider:   Chrystie Nose, MD

## 2023-04-17 DIAGNOSIS — Z0000 Encounter for general adult medical examination without abnormal findings: Secondary | ICD-10-CM | POA: Diagnosis not present

## 2023-04-17 DIAGNOSIS — E1165 Type 2 diabetes mellitus with hyperglycemia: Secondary | ICD-10-CM | POA: Diagnosis not present

## 2023-04-17 DIAGNOSIS — E6609 Other obesity due to excess calories: Secondary | ICD-10-CM | POA: Diagnosis not present

## 2023-04-17 DIAGNOSIS — E782 Mixed hyperlipidemia: Secondary | ICD-10-CM | POA: Diagnosis not present

## 2023-04-17 DIAGNOSIS — Z6829 Body mass index (BMI) 29.0-29.9, adult: Secondary | ICD-10-CM | POA: Diagnosis not present

## 2023-04-17 DIAGNOSIS — I2510 Atherosclerotic heart disease of native coronary artery without angina pectoris: Secondary | ICD-10-CM | POA: Diagnosis not present

## 2023-04-17 DIAGNOSIS — I10 Essential (primary) hypertension: Secondary | ICD-10-CM | POA: Diagnosis not present

## 2023-05-01 DIAGNOSIS — E663 Overweight: Secondary | ICD-10-CM | POA: Diagnosis not present

## 2023-05-01 DIAGNOSIS — Z6828 Body mass index (BMI) 28.0-28.9, adult: Secondary | ICD-10-CM | POA: Diagnosis not present

## 2023-05-01 DIAGNOSIS — R311 Benign essential microscopic hematuria: Secondary | ICD-10-CM | POA: Diagnosis not present

## 2023-05-04 ENCOUNTER — Telehealth: Payer: Self-pay | Admitting: Internal Medicine

## 2023-05-04 NOTE — Telephone Encounter (Signed)
Faxed cardiac clearance for DOT to [employer] at [phone]

## 2023-06-05 ENCOUNTER — Ambulatory Visit: Payer: BC Managed Care – PPO | Admitting: Urology

## 2023-06-05 ENCOUNTER — Encounter: Payer: Self-pay | Admitting: Urology

## 2023-06-05 VITALS — BP 128/75 | HR 71 | Temp 98.2°F

## 2023-06-05 DIAGNOSIS — Z720 Tobacco use: Secondary | ICD-10-CM

## 2023-06-05 DIAGNOSIS — R3129 Other microscopic hematuria: Secondary | ICD-10-CM

## 2023-06-05 LAB — URINALYSIS, ROUTINE W REFLEX MICROSCOPIC
Bilirubin, UA: NEGATIVE
Glucose, UA: NEGATIVE
Ketones, UA: NEGATIVE
Leukocytes,UA: NEGATIVE
Nitrite, UA: NEGATIVE
Protein,UA: NEGATIVE
Specific Gravity, UA: 1.03 (ref 1.005–1.030)
Urobilinogen, Ur: 1 mg/dL (ref 0.2–1.0)
pH, UA: 6 (ref 5.0–7.5)

## 2023-06-05 LAB — MICROSCOPIC EXAMINATION
Bacteria, UA: NONE SEEN
WBC, UA: NONE SEEN /hpf (ref 0–5)

## 2023-06-05 LAB — BLADDER SCAN AMB NON-IMAGING: Scan Result: 0

## 2023-06-05 NOTE — Progress Notes (Signed)
Name: Nathan [name] DOB: 1961-10-01 MRN: [id number]  History of Present Illness: Nathan [name] is a 62 y.o. male who presents today as a new patient at [facility] [place].  He reports chief complaint of microscopic hematuria. - 04/18/2023: Normal renal function (GFR 87; creatinine 0.99). Normal PSA (0.7). - 05/01/2023: Seen at [facility] for lower abdominal pain and microhematuria. UA showed small blood; no evidence of UTI. No urine microscopy.  Today: He denies increased urinary urgency, frequency, nocturia, dysuria, gross hematuria, hesitancy, straining to void, or sensations of incomplete emptying. He denies abdominal or flank pain.   He denies prior history of gross hematuria.  He denies history of kidney stones.  He denies history of recent or recurrent UTI. He denies history of GU malignancy or pelvic radiation.  He denies history of autoimmune disease. He reports history of smoking (smoked approximately 1 ppd x40 years; uses dip some). He reports taking anticoagulants (Plavix and Aspirin 81 mg).  Fall Screening: Do you usually have a device to assist in your mobility? No   Medications: Current Outpatient Medications  Medication Sig Dispense Refill   acetaminophen (TYLENOL) 500 MG tablet Take 1,000 mg by mouth as needed. For pain     aspirin EC 81 MG tablet Take 81 mg by mouth at bedtime.     augmented betamethasone dipropionate (DIPROLENE-AF) 0.05 % cream Apply 1 application. topically 2 (two) times daily as needed (skin itching/irritation.).     clopidogrel (PLAVIX) 75 MG tablet TAKE 1 TABLET BY MOUTH EVERY DAY (Patient taking differently: Take 75 mg by mouth at bedtime.) 90 tablet 3   escitalopram (LEXAPRO) 10 MG tablet Take 10 mg by mouth at bedtime.     hydrochlorothiazide (HYDRODIURIL) 25 MG tablet Take 25 mg by mouth in the morning.     JANUMET 50-1000 MG tablet Take by mouth. (Patient not taking: Reported on 04/10/2023)     metoprolol  (LOPRESSOR) 50 MG tablet Take 50 mg by mouth 2 (two) times daily.      nitroGLYCERIN (NITROSTAT) 0.4 MG SL tablet Place 1 tablet (0.4 mg total) under the tongue every 5 (five) minutes as needed for chest pain. Max 3 dose 25 tablet 2   olmesartan (BENICAR) 40 MG tablet Take 1 tablet (40 mg total) by mouth in the morning. 90 tablet 3   rosuvastatin (CRESTOR) 10 MG tablet Take 1 tablet (10 mg total) by mouth daily. 90 tablet 3   No current facility-administered medications for this visit.    Allergies: No Known Allergies  Past Medical History:  Diagnosis Date   Anxiety    CAD (coronary artery disease)    Diabetes mellitus    Hepatic steatosis    Hypercholesteremia    Hypertension    Myocardial infarction [facility]) 10/18/2003   Past Surgical History:  Procedure Laterality Date   APPENDECTOMY     CARDIAC CATHETERIZATION  01/25/2004   Cypher DES (3.5x57mm) for PPMI to RCA (Dr. Bishop Limbo)   CARDIAC CATHETERIZATION  01/03/2005   patent stent with noncritical 30% Cfx disease and 30% LAD disease (Dr. Evlyn Courier)   COLONOSCOPY  09/06/2011   Procedure: COLONOSCOPY;  Surgeon: Dalia Heading;  [facility];  Service: Gastroenterology;  Laterality: N/A;   COLONOSCOPY WITH PROPOFOL N/A 01/20/2022   Procedure: COLONOSCOPY WITH PROPOFOL;  Surgeon: Corbin Ade, MD;  [facility];  Service: Endoscopy;  Laterality: N/A;  9:15am   NM MYOCAR PERF WALL MOTION  11/2010  bruce myoview; normal pattern of perfusion in all regions, post-stress EF 62%; normal, low risk scan with improved perfusion from previous study   TRANSTHORACIC ECHOCARDIOGRAM  12/2011   EF=>55%, mild conc LVH; LA mildly dilated; trace MR & TR with normal RSVP; trace pulm valve regurg   Family History  Problem Relation Age of Onset   Cancer Mother    Heart disease Mother    Heart disease Maternal Grandmother    Diabetes Paternal Grandmother    Sudden death Paternal Grandmother    Colon cancer Neg Hx    Social  History   Socioeconomic History   Marital status: Married    Spouse name: Not on file   Number of children: 4   Years of education: Not on file   Highest education level: Not on file  Occupational History   Occupation: truck Air traffic controller: [employer]  Tobacco Use   Smoking status: Every Day    Current packs/day: 1.50    Average packs/day: 1.5 packs/day for 35.0 years (52.5 ttl pk-yrs)    Types: Cigarettes   Smokeless tobacco: Never   Tobacco comments:    1-1.5 pdd (11/20/13)  Vaping Use   Vaping status: Never Used  Substance and Sexual Activity   Alcohol use: Yes    Alcohol/week: 0.0 standard drinks of alcohol    Comment: occasionally on weekends   Drug use: No   Sexual activity: Yes  Other Topics Concern   Not on file  Social History Narrative   Not on file   Social Determinants of Health   Financial Resource Strain: Not on file  Food Insecurity: Not on file  Transportation Needs: Not on file  Physical Activity: Not on file  Stress: Not on file  Social Connections: Not on file  Intimate Partner Violence: Not on file    SUBJECTIVE  Review of Systems Constitutional: Patient denies any unintentional weight loss or change in strength lntegumentary: Patient denies any rashes or pruritus Cardiovascular: Patient denies chest pain or syncope Respiratory: Patient denies shortness of breath Gastrointestinal: Patient denies nausea, vomiting, constipation, or diarrhea Musculoskeletal: Patient denies muscle cramps or weakness Neurologic: Patient denies convulsions or seizures Psychiatric: Patient denies memory problems Allergic/Immunologic: Patient denies recent allergic reaction(s) Hematologic/Lymphatic: Patient denies bleeding tendencies Endocrine: Patient denies heat/cold intolerance  GU: As per HPI.  OBJECTIVE Vitals:   06/05/23 0827  BP: 128/75  Pulse: 71  Temp: 98.2 F (36.8 C)   There is no height or weight on file to calculate  BMI.  Physical Examination  Constitutional: No obvious distress; patient is non-toxic appearing  Cardiovascular: No visible lower extremity edema.  Respiratory: The patient does not have audible wheezing/stridor; respirations do not appear labored  Gastrointestinal: Abdomen non-distended Musculoskeletal: Normal ROM of UEs  Skin: No obvious rashes/open sores  Neurologic: CN 2-12 grossly intact Psychiatric: Answered questions appropriately with normal affect  Hematologic/Lymphatic/Immunologic: No obvious bruises or sites of spontaneous bleeding  UA: no evidence of UTI or microscopic hematuria PVR: 0 ml  ASSESSMENT Microscopic hematuria - Plan: Urinalysis, Routine w reflex microscopic, BLADDER SCAN AMB NON-IMAGING  Tobacco abuse  Asymptomatic microscopic hematuria:  No evidence of microscopic hematuria on today's urine microscopy and no prior urine microscopy results per chart review, therefore per the AUA 2020 [facility] guideline there is no indication for further workup at this time.   We discussed possible etiologies including but not limited to: vigorous exercise, sexual activity, stone, trauma, blood thinner use, urinary tract infection, urethral  irritation secondary to chronic kidney disease, glomerulonephropathy, BPH, malignancy. We discussed pt's smoking as a risk factor for GU cancer and encouraged smoking cessation.  Advised follow up visit with repeat urine microscopy in 3 months for recheck. Pt decided to pursue this work-up and follow-up afterward to discuss the results and formulate a treatment plan based on the findings. All questions were answered.   PLAN Advised the following: 1. Return in 3 months (on 09/05/2023) for Urine microscopy & f/u with Evette Georges NP. 2. Advised to stop smoking.  Orders Placed This Encounter  Procedures   Urinalysis, Routine w reflex microscopic   BLADDER SCAN AMB NON-IMAGING    It has been explained that the patient is to follow regularly  with their PCP in addition to all other providers involved in their care and to follow instructions provided by these respective offices. Patient advised to contact urology clinic if any urologic-pertaining questions, concerns, new symptoms or problems arise in the interim period.  There are no Patient Instructions on file for this visit.  Electronically signed by:  Donnita Falls, MSN, FNP-C, CUNP 06/05/2023 9:15 AM

## 2023-06-05 NOTE — Progress Notes (Signed)
post void residual=0 ?

## 2023-08-30 NOTE — Progress Notes (Signed)
Name: Nathan [name] DOB: [birth year] MRN: [id number]  History of Present Illness: [name] is a 62 y.o. male who presents today for follow up visit at [facility].  Recent history:  > 04/18/2023:  - Normal renal function (GFR 87; creatinine 0.99). - Normal PSA (0.7).  > 05/01/2023:  - Seen at [facility] for lower abdominal pain and microhematuria.  - UA showed small blood; no evidence of UTI. No urine microscopy.  > 06/05/2023:  - Initial Urology visit for microscopic hematuria.  - No evidence of UTI or microscopic hematuria on urine microscopy.  - Advised to stop smoking to minimize risk for GU cancer & to follow up in 3 months for repeat urine microscopy.  Today: He reports doing well with no acute urologic concerns. He reports occasional nocturia and occasional urgency, which are not significantly bothersome. He denies urinary daytime frequency, dysuria, gross hematuria, hesitancy, straining to void, or sensations of incomplete emptying.   Fall Screening: Do you usually have a device to assist in your mobility? No   Medications: Current Outpatient Medications  Medication Sig Dispense Refill   acetaminophen (TYLENOL) 500 MG tablet Take 1,000 mg by mouth as needed. For pain     aspirin EC 81 MG tablet Take 81 mg by mouth at bedtime.     augmented betamethasone dipropionate (DIPROLENE-AF) 0.05 % cream Apply 1 application. topically 2 (two) times daily as needed (skin itching/irritation.).     clopidogrel (PLAVIX) 75 MG tablet TAKE 1 TABLET BY MOUTH EVERY DAY (Patient taking differently: Take 75 mg by mouth at bedtime.) 90 tablet 3   escitalopram (LEXAPRO) 10 MG tablet Take 10 mg by mouth at bedtime.     hydrochlorothiazide (HYDRODIURIL) 25 MG tablet Take 25 mg by mouth in the morning.     JANUMET 50-1000 MG tablet Take by mouth.     metoprolol (LOPRESSOR) 50 MG tablet Take 50 mg by mouth 2 (two) times daily.      olmesartan (BENICAR) 40 MG  tablet Take 1 tablet (40 mg total) by mouth in the morning. 90 tablet 3   rosuvastatin (CRESTOR) 10 MG tablet Take 1 tablet (10 mg total) by mouth daily. 90 tablet 3   nitroGLYCERIN (NITROSTAT) 0.4 MG SL tablet Place 1 tablet (0.4 mg total) under the tongue every 5 (five) minutes as needed for chest pain. Max 3 dose 25 tablet 2   No current facility-administered medications for this visit.    Allergies: No Known Allergies  Past Medical History:  Diagnosis Date   Anxiety    CAD (coronary artery disease)    Diabetes mellitus    Hepatic steatosis    Hypercholesteremia    Hypertension    Myocardial infarction [facility] 10/18/2003   Past Surgical History:  Procedure Laterality Date   APPENDECTOMY     CARDIAC CATHETERIZATION  01/25/2004   Cypher DES (3.5x55mm) for PPMI to RCA (Dr. Bishop Limbo)   CARDIAC CATHETERIZATION  01/03/2005   patent stent with noncritical 30% Cfx disease and 30% LAD disease (Dr. Evlyn Courier)   COLONOSCOPY  09/06/2011   Procedure: COLONOSCOPY;  Surgeon: Dalia Heading;  Location: [facility];  Service: Gastroenterology;  Laterality: N/A;   COLONOSCOPY WITH PROPOFOL N/A 01/20/2022   Procedure: COLONOSCOPY WITH PROPOFOL;  Surgeon: Corbin Ade, MD;  Location: [facility];  Service: Endoscopy;  Laterality: N/A;  9:15am   NM MYOCAR PERF WALL MOTION  11/2010   bruce myoview; normal pattern of perfusion  in all regions, post-stress EF 62%; normal, low risk scan with improved perfusion from previous study   TRANSTHORACIC ECHOCARDIOGRAM  12/2011   EF=>55%, mild conc LVH; LA mildly dilated; trace MR & TR with normal RSVP; trace pulm valve regurg   Family History  Problem Relation Age of Onset   Cancer Mother    Heart disease Mother    Heart disease Maternal Grandmother    Diabetes Paternal Grandmother    Sudden death Paternal Grandmother    Colon cancer Neg Hx    Social History   Socioeconomic History   Marital status: Married    Spouse name: Not on file   Number of  children: 4   Years of education: Not on file   Highest education level: Not on file  Occupational History   Occupation: truck Air traffic controller: [employer]  Tobacco Use   Smoking status: Every Day    Current packs/day: 1.50    Average packs/day: 1.5 packs/day for 35.0 years (52.5 ttl pk-yrs)    Types: Cigarettes   Smokeless tobacco: Never   Tobacco comments:    1-1.5 pdd (11/20/13)  Vaping Use   Vaping status: Never Used  Substance and Sexual Activity   Alcohol use: Yes    Alcohol/week: 0.0 standard drinks of alcohol    Comment: occasionally on weekends   Drug use: No   Sexual activity: Yes  Other Topics Concern   Not on file  Social History Narrative   Not on file   Social Determinants of Health   Financial Resource Strain: Not on file  Food Insecurity: Not on file  Transportation Needs: Not on file  Physical Activity: Not on file  Stress: Not on file  Social Connections: Not on file  Intimate Partner Violence: Not on file    Review of Systems Constitutional: Patient denies any unintentional weight loss or change in strength lntegumentary: Patient denies any rashes or pruritus Cardiovascular: Patient denies chest pain or syncope Respiratory: Patient denies shortness of breath Gastrointestinal: Patient denies nausea, vomiting, constipation, or diarrhea Musculoskeletal: Patient denies muscle cramps or weakness Neurologic: Patient denies convulsions or seizures Allergic/Immunologic: Patient denies recent allergic reaction(s) Hematologic/Lymphatic: Patient denies bleeding tendencies Endocrine: Patient denies heat/cold intolerance  GU: As per HPI.  OBJECTIVE Vitals:   09/04/23 0822  BP: 132/80  Pulse: 73  Temp: 98 F (36.7 C)   Body mass index is 32.92 kg/m.  Physical Examination Constitutional: No obvious distress; patient is non-toxic appearing  Cardiovascular: No visible lower extremity edema.  Respiratory: The patient does not have audible  wheezing/stridor; respirations do not appear labored  Gastrointestinal: Abdomen non-distended Musculoskeletal: Normal ROM of UEs  Skin: No obvious rashes/open sores  Neurologic: CN 2-12 grossly intact Psychiatric: Answered questions appropriately with normal affect  Hematologic/Lymphatic/Immunologic: No obvious bruises or sites of spontaneous bleeding  UA (dipstick) showed 1+ blood however urine microscopy showed no evidence of microscopic hematuria  ASSESSMENT Microscopic hematuria - Plan: Urinalysis, Routine w reflex microscopic  Tobacco abuse  Seen for recheck. Again he has no evidence of microscopic hematuria on urine microscopy; appears that his urinalysis dipstick has consistently been erroneous (urine microscopy is more accurate / objective). Cleared for DOT by urology based on normal urine microscopy x2.   We discussed pt's smoking as a risk factor for GU cancer and encouraged smoking cessation.  We agreed to follow up on an as-needed basis. Pt verbalized understanding and agreement. All questions were answered.  PLAN Advised the following: 1.  Return if symptoms worsen or fail to improve.  Orders Placed This Encounter  Procedures   Urinalysis, Routine w reflex microscopic    It has been explained that the patient is to follow regularly with their PCP in addition to all other providers involved in their care and to follow instructions provided by these respective offices. Patient advised to contact urology clinic if any urologic-pertaining questions, concerns, new symptoms or problems arise in the interim period.  There are no Patient Instructions on file for this visit.  Electronically signed by:  Donnita Falls, FNP   09/04/23    8:51 AM

## 2023-09-04 ENCOUNTER — Encounter: Payer: Self-pay | Admitting: Urology

## 2023-09-04 ENCOUNTER — Ambulatory Visit: Payer: BC Managed Care – PPO | Admitting: Urology

## 2023-09-04 VITALS — BP 132/80 | HR 73 | Temp 98.0°F | Ht 62.0 in | Wt 180.0 lb

## 2023-09-04 DIAGNOSIS — R3129 Other microscopic hematuria: Secondary | ICD-10-CM

## 2023-09-04 DIAGNOSIS — Z720 Tobacco use: Secondary | ICD-10-CM

## 2023-09-04 LAB — URINALYSIS, ROUTINE W REFLEX MICROSCOPIC
Bilirubin, UA: NEGATIVE
Ketones, UA: NEGATIVE
Leukocytes,UA: NEGATIVE
Nitrite, UA: NEGATIVE
Protein,UA: NEGATIVE
Specific Gravity, UA: 1.03 (ref 1.005–1.030)
Urobilinogen, Ur: 0.2 mg/dL (ref 0.2–1.0)
pH, UA: 6 (ref 5.0–7.5)

## 2023-09-04 LAB — MICROSCOPIC EXAMINATION: Bacteria, UA: NONE SEEN

## 2023-10-02 DIAGNOSIS — E663 Overweight: Secondary | ICD-10-CM | POA: Diagnosis not present

## 2023-10-02 DIAGNOSIS — Z6829 Body mass index (BMI) 29.0-29.9, adult: Secondary | ICD-10-CM | POA: Diagnosis not present

## 2023-10-02 DIAGNOSIS — E1159 Type 2 diabetes mellitus with other circulatory complications: Secondary | ICD-10-CM | POA: Diagnosis not present

## 2024-03-20 ENCOUNTER — Telehealth (HOSPITAL_COMMUNITY): Payer: Self-pay | Admitting: *Deleted

## 2024-03-20 NOTE — Telephone Encounter (Signed)
 Patient given detailed instructions per Myocardial Perfusion Study Information Sheet for the test on 03/25/2024 at 8:00. Patient notified to arrive 15 minutes early and that it is imperative to arrive on time for appointment to keep from having the test rescheduled.  If you need to cancel or reschedule your appointment, please call the office within 24 hours of your appointment. . Patient verbalized understanding.Nathan [name]

## 2024-03-21 ENCOUNTER — Other Ambulatory Visit: Payer: Self-pay | Admitting: Adult Health

## 2024-03-21 DIAGNOSIS — I10 Essential (primary) hypertension: Secondary | ICD-10-CM

## 2024-03-25 ENCOUNTER — Ambulatory Visit (HOSPITAL_COMMUNITY)
Admission: RE | Admit: 2024-03-25 | Discharge: 2024-03-25 | Disposition: A | Discharge status: Home / Self Care | Payer: BC Managed Care – PPO | Source: Ambulatory Visit | Attending: Cardiovascular Disease | Admitting: Cardiovascular Disease

## 2024-03-25 DIAGNOSIS — I10 Essential (primary) hypertension: Secondary | ICD-10-CM | POA: Diagnosis present

## 2024-03-25 LAB — MYOCARDIAL PERFUSION IMAGING
LV dias vol: 87 mL (ref 62–150)
LV sys vol: 24 mL
Nuc Stress EF: 72 %
Peak HR: 88 {beats}/min
Rest HR: 61 {beats}/min
Rest Nuclear Isotope Dose: 10.1 mCi
SDS: 3
SRS: 6
SSS: 5
ST Depression (mm): 0 mm
Stress Nuclear Isotope Dose: 31.2 mCi
TID: 1.12

## 2024-03-25 MED ORDER — TECHNETIUM TC 99M TETROFOSMIN IV KIT
31.2000 | PACK | Freq: Once | INTRAVENOUS | Status: AC | PRN
  Administered 2024-03-25: 31.2 via INTRAVENOUS

## 2024-03-25 MED ORDER — REGADENOSON 0.4 MG/5ML IV SOLN
0.4000 mg | Freq: Once | INTRAVENOUS | Status: AC
Start: 2024-03-25 — End: 2024-03-25
  Administered 2024-03-25: 0.4 mg via INTRAVENOUS

## 2024-03-25 MED ORDER — REGADENOSON 0.4 MG/5ML IV SOLN
INTRAVENOUS | Status: AC
  Filled 2024-03-25: qty 5

## 2024-03-25 MED ORDER — TECHNETIUM TC 99M TETROFOSMIN IV KIT
10.1000 | PACK | Freq: Once | INTRAVENOUS | Status: AC | PRN
  Administered 2024-03-25: 10.1 via INTRAVENOUS

## 2024-03-26 ENCOUNTER — Ambulatory Visit: Payer: Self-pay | Admitting: *Deleted

## 2024-04-01 ENCOUNTER — Encounter (HOSPITAL_COMMUNITY): Payer: BC Managed Care – PPO

## 2024-04-09 ENCOUNTER — Telehealth (HOSPITAL_BASED_OUTPATIENT_CLINIC_OR_DEPARTMENT_OTHER): Payer: Self-pay | Admitting: Emergency Medicine

## 2024-04-09 ENCOUNTER — Ambulatory Visit: Payer: Self-pay | Attending: Internal Medicine | Admitting: Internal Medicine

## 2024-04-09 ENCOUNTER — Encounter: Payer: Self-pay | Admitting: Internal Medicine

## 2024-04-09 VITALS — BP 120/72 | HR 62 | Ht 66.0 in | Wt 165.2 lb

## 2024-04-09 DIAGNOSIS — R9389 Abnormal findings on diagnostic imaging of other specified body structures: Secondary | ICD-10-CM

## 2024-04-09 DIAGNOSIS — E785 Hyperlipidemia, unspecified: Secondary | ICD-10-CM | POA: Diagnosis not present

## 2024-04-09 DIAGNOSIS — R918 Other nonspecific abnormal finding of lung field: Secondary | ICD-10-CM | POA: Diagnosis not present

## 2024-04-09 DIAGNOSIS — Z0289 Encounter for other administrative examinations: Secondary | ICD-10-CM

## 2024-04-09 DIAGNOSIS — I10 Essential (primary) hypertension: Secondary | ICD-10-CM

## 2024-04-09 NOTE — Patient Instructions (Addendum)
 Medication Instructions:  Your physician recommends that you continue on your current medications as directed. Please refer to the Current Medication list given to you today.  *If you need a refill on your cardiac medications before your next appointment, please call your pharmacy*  Follow-Up: At [facility], you and your health needs are our priority.  As part of our continuing mission to provide you with exceptional heart care, our providers are all part of one team.  This team includes your primary Cardiologist (physician) and Advanced Practice Providers or APPs (Physician Assistants and Nurse Practitioners) who all work together to provide you with the care you need, when you need it.  Your next appointment:   1 year with Dr. Mona- Gen cardiology  Other Instructions Referral to Pulmonology

## 2024-04-09 NOTE — Telephone Encounter (Signed)
 Ok to add him to lauraine Lites, NP 04/24/24 at 1:30. Please send consult paperwork. Dr Mona is ordering a PET to be done before he is seen.

## 2024-04-09 NOTE — Telephone Encounter (Signed)
 Patient has been urgently referred by Dr Mona for left lower lobe lung mass concerning for malignancy. There are NO openings at [facility] with either [facility] or [facility] until 7/21. Please advise if patient is okay to wait or if he can be worked in sooner.

## 2024-04-09 NOTE — Addendum Note (Signed)
 Addended by: MONA VINIE BROCKS on: 04/09/2024 12:34 PM   Modules accepted: Orders

## 2024-04-09 NOTE — Progress Notes (Addendum)
 Date:  04/09/2024   ID:  Nathan [name], DOB Apr 03, 1961, MRN [id number]  PCP:  [name], MD  Primary Cardiologist:  Mona   CC: Annual testing   History of Present Illness: Nathan [name] is a 63 y.o. 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  He also has a history of dyslipidemia, diabetes, hypertension and ongoing tobacco abuse. 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  He usually travels as far as 30 miles away and only occassionally stays overnight.   He also noted that on a form it required almost 2 weeks prior to submit all of the information for that physical. His last stress test was in 2015 and was negative for ischemia. 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  The DOT  requires repeat stress testing every 2 years.     Today he is here to get renewal of his DOT physical.  The patient has no complaints and reports doing well this last year.  He does however, continue to smoke.   He currently denies nausea, vomiting, fever, chest pain, shortness of breath, orthopnea, dizziness, PND, cough, congestion, abdominal pain, hematochezia, melena, lower extremity edema, claudication.  11/07/2016  Nathan [name] returns today for annual follow-up. This is also for cardiovascular evaluation for DOT physical. Last year he underwent a nuclear stress test which was negative for ischemia. Since last rate is had no new symptoms. 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 He denies any chest pain or worsening shortness of breath. EKG today shows sinus bradycardia at 55. Blood pressure is well controlled at 110/72. Recent lab work indicated that total cholesterol 113, tragus rate 68, HDL-C of 34 and LDL-C is 65, on moderate dose of a high potency rosuvastatin  10 mg.  10/23/2017  Nathan [name] was seen today for annual follow-up.  He is doing  well from a cardiac standpoint denies any chest pain or worsening shortness of breath.  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  He said he was renewed through August and would likely need a stress test at that time.  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  His weight had come down over the past couple years to his current rate around 186 pounds.  Most of these changes were due to mild diabetes which is followed by his primary care provider.  His lipid profile will be reassessed next month and will obtain a copy of that.  He will need a repeat stress test per DOT guidelines in July.  07/02/2018  Nathan [name] was seen today for follow-up.  He recently underwent stress testing in July for his DOT physical.  This was low risk with normal LV function.  He has had no new chest pain or worsening shortness of breath.  Blood pressure is well controlled is a repeat today came down to 118/72.  He is hemoglobin A1c is controlled at 6.0.  Cholesterol is at goal with LDL less than 70.  He remains physically active.  05/04/2020  Nathan [name] seen today in follow-up.  He was last seen by Dorothyann Satterfield, DNP for ongoing evaluation.  He underwent nuclear stress testing on 04/24/2020 which was negative for ischemia.  Achieved 10 METS of exercise and LVEF was 54%.  There was a small anteroseptal fixed perfusion defect which was seen previously indicating a small infarct.  This was previously noted and no new ischemia was found.  Overall he is asymptomatic.  He denies any angina.  He remains physically active.  Blood pressures well controlled today 125/74.  His recent lab work in January 2021 showed total cholesterol 119, HDL 38, LDL 63 and triglycerides 94, hemoglobin A1c 6.7.  05/04/2021  Nathan [name] returns today for follow-up.  Overall he continues to do well.  Is now been a year since I last saw him.  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  This is a follow-up for that.  He has no chest pain or  worsening shortness of breath.  His last Myoview  in 2021 is listed above.  Recently his blood sugar was elevated somewhat at 8.1 however has improved to 6.0 A1c.  Triglycerides recently rechecked were slightly elevated 153 but otherwise LDL cholesterol is at goal less than 70.  04/09/2024  Nathan [name] returns today for follow-up.  This is a routine visit for DOT testing.  He underwent scheduled Myoview  stress testing, this year a CT attenuation corrected study.  The Myoview  was negative for ischemia and showed LVEF 72%.  The radiology over read, however is concerning demonstrating a 2.9 x 2.3 cm mass in the superior segment of the left lower lobe which was consistent with malignancy.  PET scan was recommended by radiology.  He denies any hemoptysis or worsening shortness of breath.  He is a current smoker.  He is also under a lot of stress.  His wife died just a few weeks ago.  Wt Readings from Last 3 Encounters:  04/09/24 165 lb 3.2 oz (74.9 kg)  09/04/23 180 lb (81.6 kg)  04/10/23 176 lb 12.8 oz (80.2 kg)     Past Medical History:  Diagnosis Date   Anxiety    CAD (coronary artery disease)    Diabetes mellitus    Hepatic steatosis    Hypercholesteremia    Hypertension    Myocardial infarction (HCC) 10/18/2003    Current Outpatient Medications  Medication Sig Dispense Refill   acetaminophen (TYLENOL) 500 MG tablet Take 1,000 mg by mouth as needed. For pain     aspirin EC 81 MG tablet Take 81 mg by mouth at bedtime.     augmented betamethasone dipropionate (DIPROLENE-AF) 0.05 % cream Apply 1 application. topically 2 (two) times daily as needed (skin itching/irritation.).     clopidogrel  (PLAVIX ) 75 MG tablet TAKE 1 TABLET BY MOUTH EVERY DAY (Patient taking differently: Take 75 mg by mouth at bedtime.) 90 tablet 3   hydrochlorothiazide (HYDRODIURIL) 25 MG tablet Take 25 mg by mouth in the morning.     metoprolol (LOPRESSOR) 50 MG tablet Take 50 mg by mouth 2 (two) times daily.       nitroGLYCERIN  (NITROSTAT ) 0.4 MG SL tablet Place 1 tablet (0.4 mg total) under the tongue every 5 (five) minutes as needed for chest pain. Max 3 dose 25 tablet 2   olmesartan  (BENICAR ) 40 MG tablet Take 1 tablet (40 mg total) by mouth in the morning. 90 tablet 3   rosuvastatin  (CRESTOR ) 10 MG tablet Take 1 tablet (10 mg total) by mouth daily. 90 tablet 3   escitalopram (LEXAPRO) 10 MG tablet Take 10 mg by mouth at bedtime.     JANUMET 50-1000 MG tablet Take by mouth.     No current facility-administered medications for this visit.    Allergies:  No Known Allergies  Social History:  The patient  reports that he has been smoking cigarettes. He has a 52.5 pack-year smoking history. He has never used smokeless tobacco. He reports current alcohol use. He reports that he does not use drugs.   Family history:   Family History  Problem Relation Age of Onset   Cancer Mother    Heart disease Mother    Heart disease Maternal Grandmother    Diabetes Paternal Grandmother    Sudden death Paternal Grandmother    Colon cancer Neg Hx     ROS: Pertinent items noted in HPI and remainder of comprehensive ROS otherwise negative.   PHYSICAL EXAM: VS:  BP 120/72 (BP Location: Left Arm, Patient Position: Sitting, Cuff Size: Normal)   Pulse 62   Ht 5' 6 (1.676 m)   Wt 165 lb 3.2 oz (74.9 kg)   SpO2 96%   BMI 26.66 kg/m  General appearance: alert and no distress Neck: no carotid bruit, no JVD and thyroid not enlarged, symmetric, no tenderness/mass/nodules Lungs: clear to auscultation bilaterally Heart: regular rate and rhythm, S1, S2 normal, no murmur, click, rub or gallop Abdomen: soft, non-tender; bowel sounds normal; no masses,  no organomegaly Extremities: extremities normal, atraumatic, no cyanosis or edema Pulses: 2+ and symmetric Skin: Skin color, texture, turgor normal. No rashes or lesions Neurologic: Grossly normal Psych: Pleasant  EKG:  EKG Interpretation Date/Time:  Tuesday April 09 2024 08:09:54 EDT Ventricular Rate:  62 PR Interval:  182 QRS Duration:  98 QT Interval:  396 QTC Calculation: 401 R Axis:   58  Text Interpretation: Normal sinus rhythm Normal ECG When compared with ECG of 10-Apr-2023 08:02, No significant change was found Confirmed by Mona Kent [phone]) on 04/09/2024 8:27:46 AM    ASSESSMENT: Left lower lobe mass concerning for possible malignancy CAD with history of inferior MI status post PCI to the RCA in 2005 Hypertension-controlled Dyslipidemia Type 2 diabetes Encounter for DOT physical  PLAN: Mr. Kerschner had a low risk Myoview  stress test and is asymptomatic.  He has good blood pressure control and his LDL is less than 70.  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  There was however an incidental finding of left lower lobe mass concerning for possible malignancy.  Will refer to the comprehensive lung cancer clinic for further evaluation.  Follow-up with me annually or sooner as necessary.  Kent KYM Mona, MD, [facility], FNLA, FACP  [place]  [facility]  Medical Director of the Advanced Lipid Disorders &  Cardiovascular Risk Reduction Clinic Diplomate of the American Board of Clinical Lipidology Attending Cardiologist  Direct Dial: [phone]  Fax: [phone]  Website:  [email or web address]

## 2024-04-29 ENCOUNTER — Telehealth: Payer: Self-pay | Admitting: Acute Care

## 2024-04-29 ENCOUNTER — Ambulatory Visit: Admitting: Acute Care

## 2024-04-29 ENCOUNTER — Telehealth: Payer: Self-pay

## 2024-04-29 ENCOUNTER — Encounter: Payer: Self-pay | Admitting: Emergency Medicine

## 2024-04-29 ENCOUNTER — Encounter: Payer: Self-pay | Admitting: Acute Care

## 2024-04-29 ENCOUNTER — Telehealth: Payer: Self-pay | Admitting: Internal Medicine

## 2024-04-29 VITALS — BP 132/66 | HR 64 | Ht 66.0 in | Wt 166.6 lb

## 2024-04-29 DIAGNOSIS — R040 Epistaxis: Secondary | ICD-10-CM | POA: Diagnosis not present

## 2024-04-29 DIAGNOSIS — R918 Other nonspecific abnormal finding of lung field: Secondary | ICD-10-CM | POA: Insufficient documentation

## 2024-04-29 DIAGNOSIS — F17200 Nicotine dependence, unspecified, uncomplicated: Secondary | ICD-10-CM

## 2024-04-29 DIAGNOSIS — F17210 Nicotine dependence, cigarettes, uncomplicated: Secondary | ICD-10-CM

## 2024-04-29 NOTE — Telephone Encounter (Signed)
   Patient Name: Nathan [name]  DOB: May 18, 1961 MRN: [id number]  Primary Cardiologist: Vinie JAYSON Maxcy, MD  Chart reviewed as part of pre-operative protocol coverage.   Pharmacy clearance requested to hold clopidogrel  x 5 days prior to pulmonary procedure due to need for biopsy of concerning lung nodule. Per office perioperative DAPT algorithm, patient meets criteria to hold this as requested. Additionally, he had a recent OV 03/2024 felt reassuring from cardiac standpoint. Will route this bundled recommendation to requesting provider via Epic fax function. Please call with questions.   Elmo Shumard N Kaylon Hitz, PA-C 04/29/2024, 3:07 PM

## 2024-04-29 NOTE — Patient Instructions (Addendum)
 It is good to see you today. We have reviewed your CT chest. You have a 3 x 2.3 mm spiculated mass. We have discussed biopsy of the mass, and you are in agreement with moving forward with bronchoscopy with biopsies. I have placed an order for a bronchoscopy with biopsies.  We have discussed the procedure in detail.  We have reviewed the risks and benefits of the procedure. These include bleeding, infection, puncture of the lung, and adverse reaction to anesthesia. You have agreed to proceed with biopsy to evaluate the left lower lobe nodule. Your procedure will be done by Dr. Lamar Chris. You will receive a letter today with date time and information pertaining to the procedure. You will need someone to drive you to the procedure, stay with you during the procedure, and stay with you after the procedure. You will also need someone to stay with you for 24 hours after anesthesia to ensure you have cleared and are doing well. You will follow-up with me 1 week after the procedure to review the results and to ensure you are doing well. Call if you need us  prior to the procedure or if you have any questions at all. Please contact office for sooner follow up if symptoms do not improve or worsen or seek emergency care     You can receive free nicotine replacement therapy (patches, gum, or mints) by calling 1-800-QUIT NOW. Please call so we can get you on the path to becoming a non-smoker. I know it is hard, but you can do this!  Hypnosis for smoking cessation  [employer] [phone]  Acupuncture for smoking cessation  [employer] [phone]

## 2024-04-29 NOTE — Progress Notes (Signed)
 History of Present Illness Nathan [name] is a 63 y.o. male current every day smoker referred for a lung mass incidentally discovered on a cardiac perfusion test. He will be followed by Dr. Shelah.  Pt. Has consented to use of [employer] soft wear to help capture the content of this OV    04/29/2024 Nathan MAURIELLO is a 63 year old male with coronary artery disease who presents for evaluation of a lung nodule. He was referred by his cardiologist, Dr. Mona, after a cardiac perfusion study revealed a lung nodule.  A routine cardiac perfusion study identified a 2 by 3 centimeter spiculated mass in the left lower lobe of his lung, absent in a previous scan from April 2022. He has a history of coronary artery disease with a stent placement in 2005, necessitating regular cardiac follow-ups.  We have reviewed his cardiac perfusion scan. There is a 2.9 x 2.3 cm slightly spiculated mass is seen in superior segment of left lower lobe which appears to extend to adjacent major fissure concerning for malignancy. We discussed that the next best step is to biopsy the lung mass. He is in agreement with this plan. We discussed the risks to include bleeding, infection, pneumothorax and adverse reaction to anesthesia. He is here with his daughter and both agreed to moving forward with bronchoscopy and biopsy for definitive tissue diagnosis.He understands he will need to come off his Plavix  x 5 days and his ASA x 48 hours. We have called cardiology to get clearance to come off both for a bronch 7/21.  He has a significant smoking history. No new or increased cough, hemoptysis, or significant changes in sputum production. He describes his cough as a 'smoker's cough' that has been long-standing without recent changes.  He has experienced a weight loss of about 15 pounds, attributed to dietary changes and the recent passing of his wife on June 5th.   He has diabetes managed with metformin, taken twice daily. He also  takes Plavix  and aspirin due to his cardiac history, with Plavix  prescribed since his stent placement in 2005.   He reports occasional epistaxis, particularly when straining, which he associates with his use of blood thinners. He has a history of easy epistaxis during childhood.  No family history of lung cancer, although his mother had lymphoma. He has had basal cell carcinoma treated in the past.  He works as a [employer] and is concerned about scheduling medical procedures around his work commitments. No known exposure to harmful chemicals in his job.  We discussed that he needs to quit smoking. He was counseled x 3 minutes today. Smoking cessation resources were provided to help him work on quitting smoking. He has never had PFT's for formal diagnosis, we will discuss scheduling at follow up.   Test Results: Myocardial perfusion scan 03/28/2024 Lungs/Pleura: 2.9 x 2.3 cm slightly spiculated mass is seen in superior segment of left lower lobe which appears to extend to adjacent major fissure consistent with malignancy.  2.9 x 2.3 cm mass seen in superior segment of left lower lobe consistent with malignancy. PET scan is recommended.     Latest Ref Rng & Units 01/30/2021   12:25 PM 05/18/2013   10:55 AM  CBC  WBC 4.0 - 10.5 K/uL 6.0  8.7   Hemoglobin 13.0 - 17.0 g/dL 84.2  84.3   Hematocrit 39.0 - 52.0 % 44.0  43.3   Platelets 150 - 400 K/uL 246  270  Latest Ref Rng & Units 01/18/2022    2:39 PM 01/30/2021   12:25 PM 05/18/2013   10:55 AM  BMP  Glucose 70 - 99 mg/dL 847  875  91   BUN 6 - 20 mg/dL 20  24  13    Creatinine 0.61 - 1.24 mg/dL 8.96  8.77  9.16   Sodium 135 - 145 mmol/L 138  139  138   Potassium 3.5 - 5.1 mmol/L 3.5  3.3  4.1   Chloride 98 - 111 mmol/L 101  102  100   CO2 22 - 32 mmol/L 27  24  31    Calcium  8.9 - 10.3 mg/dL 8.9  9.2  9.5     BNP No results found for: BNP  ProBNP No results found for: PROBNP  PFT No results found for: FEV1PRE,  FEV1POST, FVCPRE, FVCPOST, TLC, DLCOUNC, PREFEV1FVCRT, PSTFEV1FVCRT  No results found.   Past medical hx Past Medical History:  Diagnosis Date   Anxiety    CAD (coronary artery disease)    Diabetes mellitus    Hepatic steatosis    Hypercholesteremia    Hypertension    Myocardial infarction (HCC) 10/18/2003     Social History   Tobacco Use   Smoking status: Every Day    Current packs/day: 1.50    Average packs/day: 1.5 packs/day for 35.0 years (52.5 ttl pk-yrs)    Types: Cigarettes   Smokeless tobacco: Current    Types: Snuff   Tobacco comments:    1 pack a day 04/29/2024 KRD  Vaping Use   Vaping status: Never Used  Substance Use Topics   Alcohol use: Yes    Alcohol/week: 0.0 standard drinks of alcohol    Comment: occasionally on weekends   Drug use: No    [name] reports that he has been smoking cigarettes. He has a 52.5 pack-year smoking history. His smokeless tobacco use includes snuff. He reports current alcohol use. He reports that he does not use drugs.  Tobacco Cessation: Ready to quit: Not Answered Counseling given: Not Answered Tobacco comments: 1 pack a day 04/29/2024 KRD Current every day smoker  Counseled x 3 minutes on smoking cessation. See AVS for resources provided.   Past surgical hx, Family hx, Social hx all reviewed.  Current Outpatient Medications on File Prior to Visit  Medication Sig   acetaminophen (TYLENOL) 500 MG tablet Take 1,000 mg by mouth as needed. For pain   aspirin EC 81 MG tablet Take 81 mg by mouth at bedtime.   augmented betamethasone dipropionate (DIPROLENE-AF) 0.05 % cream Apply 1 application. topically 2 (two) times daily as needed (skin itching/irritation.).   clopidogrel  (PLAVIX ) 75 MG tablet TAKE 1 TABLET BY MOUTH EVERY DAY (Patient taking differently: Take 75 mg by mouth at bedtime.)   hydrochlorothiazide (HYDRODIURIL) 25 MG tablet Take 25 mg by mouth in the morning.   metFORMIN (GLUCOPHAGE) 1000 MG tablet  Take 1,000 mg by mouth 2 (two) times daily.   metoprolol (LOPRESSOR) 50 MG tablet Take 50 mg by mouth 2 (two) times daily.    nitroGLYCERIN  (NITROSTAT ) 0.4 MG SL tablet Place 1 tablet (0.4 mg total) under the tongue every 5 (five) minutes as needed for chest pain. Max 3 dose   olmesartan  (BENICAR ) 40 MG tablet Take 1 tablet (40 mg total) by mouth in the morning.   rosuvastatin  (CRESTOR ) 10 MG tablet Take 1 tablet (10 mg total) by mouth daily.   escitalopram (LEXAPRO) 10 MG tablet Take 10 mg by mouth at bedtime.  JANUMET 50-1000 MG tablet Take by mouth.   No current facility-administered medications on file prior to visit.     No Known Allergies  Review Of Systems:  Constitutional:   No  weight loss, night sweats,  Fevers, chills, fatigue, or  lassitude.  HEENT:   No headaches,  Difficulty swallowing,  Tooth/dental problems, or  Sore throat,                No sneezing, itching, ear ache, nasal congestion, post nasal drip,   CV:  No chest pain,  Orthopnea, PND, swelling in lower extremities, anasarca, dizziness, palpitations, syncope.   GI  No heartburn, indigestion, abdominal pain, nausea, vomiting, diarrhea, change in bowel habits, loss of appetite, bloody stools.   Resp: + shortness of breath with exertion less at rest.  No excess mucus, no productive cough,  No non-productive cough,  No coughing up of blood.  No change in color of mucus.  No wheezing.  No chest wall deformity  Skin: no rash or lesions.  GU: no dysuria, change in color of urine, no urgency or frequency.  No flank pain, no hematuria   MS:  No joint pain or swelling.  No decreased range of motion.  No back pain.  Psych:  No change in mood or affect. No depression or anxiety.  No memory loss.   Vital Signs BP 132/66 (BP Location: Left Arm, Patient Position: Sitting, Cuff Size: Normal)   Pulse 64   Ht 5' 6 (1.676 m)   Wt 166 lb 9.6 oz (75.6 kg)   SpO2 99%   BMI 26.89 kg/m    Physical Exam:  General- No  distress,  A&Ox3, pleasant and appropriately concerned.  ENT: No sinus tenderness, TM clear, pale nasal mucosa, no oral exudate,no post nasal drip, no LAN Cardiac: S1, S2, regular rate and rhythm, no murmur Chest: No wheeze/ rales/ dullness; no accessory muscle use, no nasal flaring, no sternal retractions, slightly diminished per bases  Abd.: Soft Non-tender, ND, BS +, Body mass index is 26.89 kg/m.  Ext: No clubbing cyanosis, edema, no obvious deformities Neuro:  normal strength, MAE x 4, A&O x 3 , appropriate Skin: No rashes, warm and dry, No obvious skin lesions  Psych: normal mood and behavior   Assessment/Plan Lung mass in left lower lobe 2x3 cm spiculated mass in left lower lobe, concerning for malignancy due to size, spiculated edges, and smoking history. Absent on scan two years ago. - Order dedicated CT scan of the chest. - Schedule robotic-assisted navigational bronchoscopy with biopsies for July 21. - Hold Plavix  for 5 days and aspirin for 48 hours prior to procedure. - Obtain cardiology clearance for holding Plavix  and aspirin. - Order PET scan post-biopsy for staging if malignancy confirmed. - Consider PFT's at follow up I have placed an order for a bronchoscopy with biopsies.  We have discussed the procedure in detail.  We have reviewed the risks and benefits of the procedure. These include bleeding, infection, puncture of the lung, and adverse reaction to anesthesia. You have agreed to proceed with biopsy to evaluate the left lower lobe nodule. Your procedure will be done by Dr. Lamar Chris. You will receive a letter today with date time and information pertaining to the procedure. You will need someone to drive you to the procedure, stay with you during the procedure, and stay with you after the procedure. You will also need someone to stay with you for 24 hours after anesthesia to ensure you have  cleared and are doing well. You will follow-up with me 1 week after the  procedure to review the results and to ensure you are doing well. Call if you need us  prior to the procedure or if you have any questions at all. Please contact office for sooner follow up if symptoms do not improve or worsen or seek emergency care     You can receive free nicotine replacement therapy (patches, gum, or mints) by calling 1-800-QUIT NOW. Please call so we can get you on the path to becoming a non-smoker. I know it is hard, but you can do this!  Hypnosis for smoking cessation  [employer] [phone]  Acupuncture for smoking cessation  [employer] [phone]    Smoking Long-standing smoking history contributing to lung mass concern. - Discuss smoking cessation strategies at follow-up.See above  Coronary artery disease with stent placement Coronary artery disease with stent placement in 2005. On Plavix  and aspirin. - Consult cardiologist for clearance to hold Plavix  and aspirin.  - Refer to ENT for evaluation of epistaxis.  I spent 35 minutes dedicated to the care of this patient on the date of this encounter to include pre-visit review of records, face-to-face time with the patient discussing conditions above, post visit ordering of testing, clinical documentation with the electronic health record, making appropriate referrals as documented, and communicating necessary information to the patient's healthcare team.    Lauraine JULIANNA Lites, NP 04/29/2024  1:37 PM

## 2024-04-29 NOTE — H&P (View-Only) (Signed)
 History of Present Illness Nathan [name] is a 63 y.o. male current every day smoker referred for a lung mass incidentally discovered on a cardiac perfusion test. He will be followed by Dr. Shelah.  Pt. Has consented to use of [employer] soft wear to help capture the content of this OV    04/29/2024 Nathan [name] is a 63 year old male with coronary artery disease who presents for evaluation of a lung nodule. He was referred by his cardiologist, Dr. Mona, after a cardiac perfusion study revealed a lung nodule.  A routine cardiac perfusion study identified a 2 by 3 centimeter spiculated mass in the left lower lobe of his lung, absent in a previous scan from April 2022. He has a history of coronary artery disease with a stent placement in 2005, necessitating regular cardiac follow-ups.  We have reviewed his cardiac perfusion scan. There is a 2.9 x 2.3 cm slightly spiculated mass is seen in superior segment of left lower lobe which appears to extend to adjacent major fissure concerning for malignancy. We discussed that the next best step is to biopsy the lung mass. He is in agreement with this plan. We discussed the risks to include bleeding, infection, pneumothorax and adverse reaction to anesthesia. He is here with his daughter and both agreed to moving forward with bronchoscopy and biopsy for definitive tissue diagnosis.He understands he will need to come off his Plavix  x 5 days and his ASA x 48 hours. We have called cardiology to get clearance to come off both for a bronch 7/21.  He has a significant smoking history. No new or increased cough, hemoptysis, or significant changes in sputum production. He describes his cough as a 'smoker's cough' that has been long-standing without recent changes.  He has experienced a weight loss of about 15 pounds, attributed to dietary changes and the recent passing of his wife on June 5th.   He has diabetes managed with metformin, taken twice daily. He also  takes Plavix  and aspirin due to his cardiac history, with Plavix  prescribed since his stent placement in 2005.   He reports occasional epistaxis, particularly when straining, which he associates with his use of blood thinners. He has a history of easy epistaxis during childhood.  No family history of lung cancer, although his mother had lymphoma. He has had basal cell carcinoma treated in the past.  He works as a [employer] and is concerned about scheduling medical procedures around his work commitments. No known exposure to harmful chemicals in his job.  We discussed that he needs to quit smoking. He was counseled x 3 minutes today. Smoking cessation resources were provided to help him work on quitting smoking. He has never had PFT's for formal diagnosis, we will discuss scheduling at follow up.   Test Results: Myocardial perfusion scan 03/28/2024 Lungs/Pleura: 2.9 x 2.3 cm slightly spiculated mass is seen in superior segment of left lower lobe which appears to extend to adjacent major fissure consistent with malignancy.  2.9 x 2.3 cm mass seen in superior segment of left lower lobe consistent with malignancy. PET scan is recommended.     Latest Ref Rng & Units 01/30/2021   12:25 PM 05/18/2013   10:55 AM  CBC  WBC 4.0 - 10.5 K/uL 6.0  8.7   Hemoglobin 13.0 - 17.0 g/dL 84.2  84.3   Hematocrit 39.0 - 52.0 % 44.0  43.3   Platelets 150 - 400 K/uL 246  270  Latest Ref Rng & Units 01/18/2022    2:39 PM 01/30/2021   12:25 PM 05/18/2013   10:55 AM  BMP  Glucose 70 - 99 mg/dL 847  875  91   BUN 6 - 20 mg/dL 20  24  13    Creatinine 0.61 - 1.24 mg/dL 8.96  8.77  9.16   Sodium 135 - 145 mmol/L 138  139  138   Potassium 3.5 - 5.1 mmol/L 3.5  3.3  4.1   Chloride 98 - 111 mmol/L 101  102  100   CO2 22 - 32 mmol/L 27  24  31    Calcium  8.9 - 10.3 mg/dL 8.9  9.2  9.5     BNP No results found for: BNP  ProBNP No results found for: PROBNP  PFT No results found for: FEV1PRE,  FEV1POST, FVCPRE, FVCPOST, TLC, DLCOUNC, PREFEV1FVCRT, PSTFEV1FVCRT  No results found.   Past medical hx Past Medical History:  Diagnosis Date   Anxiety    CAD (coronary artery disease)    Diabetes mellitus    Hepatic steatosis    Hypercholesteremia    Hypertension    Myocardial infarction (HCC) 10/18/2003     Social History   Tobacco Use   Smoking status: Every Day    Current packs/day: 1.50    Average packs/day: 1.5 packs/day for 35.0 years (52.5 ttl pk-yrs)    Types: Cigarettes   Smokeless tobacco: Current    Types: Snuff   Tobacco comments:    1 pack a day 04/29/2024 KRD  Vaping Use   Vaping status: Never Used  Substance Use Topics   Alcohol use: Yes    Alcohol/week: 0.0 standard drinks of alcohol    Comment: occasionally on weekends   Drug use: No    [name] reports that he has been smoking cigarettes. He has a 52.5 pack-year smoking history. His smokeless tobacco use includes snuff. He reports current alcohol use. He reports that he does not use drugs.  Tobacco Cessation: Ready to quit: Not Answered Counseling given: Not Answered Tobacco comments: 1 pack a day 04/29/2024 KRD Current every day smoker  Counseled x 3 minutes on smoking cessation. See AVS for resources provided.   Past surgical hx, Family hx, Social hx all reviewed.  Current Outpatient Medications on File Prior to Visit  Medication Sig   acetaminophen (TYLENOL) 500 MG tablet Take 1,000 mg by mouth as needed. For pain   aspirin EC 81 MG tablet Take 81 mg by mouth at bedtime.   augmented betamethasone dipropionate (DIPROLENE-AF) 0.05 % cream Apply 1 application. topically 2 (two) times daily as needed (skin itching/irritation.).   clopidogrel  (PLAVIX ) 75 MG tablet TAKE 1 TABLET BY MOUTH EVERY DAY (Patient taking differently: Take 75 mg by mouth at bedtime.)   hydrochlorothiazide (HYDRODIURIL) 25 MG tablet Take 25 mg by mouth in the morning.   metFORMIN (GLUCOPHAGE) 1000 MG tablet  Take 1,000 mg by mouth 2 (two) times daily.   metoprolol (LOPRESSOR) 50 MG tablet Take 50 mg by mouth 2 (two) times daily.    nitroGLYCERIN  (NITROSTAT ) 0.4 MG SL tablet Place 1 tablet (0.4 mg total) under the tongue every 5 (five) minutes as needed for chest pain. Max 3 dose   olmesartan  (BENICAR ) 40 MG tablet Take 1 tablet (40 mg total) by mouth in the morning.   rosuvastatin  (CRESTOR ) 10 MG tablet Take 1 tablet (10 mg total) by mouth daily.   escitalopram (LEXAPRO) 10 MG tablet Take 10 mg by mouth at bedtime.  JANUMET 50-1000 MG tablet Take by mouth.   No current facility-administered medications on file prior to visit.     No Known Allergies  Review Of Systems:  Constitutional:   No  weight loss, night sweats,  Fevers, chills, fatigue, or  lassitude.  HEENT:   No headaches,  Difficulty swallowing,  Tooth/dental problems, or  Sore throat,                No sneezing, itching, ear ache, nasal congestion, post nasal drip,   CV:  No chest pain,  Orthopnea, PND, swelling in lower extremities, anasarca, dizziness, palpitations, syncope.   GI  No heartburn, indigestion, abdominal pain, nausea, vomiting, diarrhea, change in bowel habits, loss of appetite, bloody stools.   Resp: + shortness of breath with exertion less at rest.  No excess mucus, no productive cough,  No non-productive cough,  No coughing up of blood.  No change in color of mucus.  No wheezing.  No chest wall deformity  Skin: no rash or lesions.  GU: no dysuria, change in color of urine, no urgency or frequency.  No flank pain, no hematuria   MS:  No joint pain or swelling.  No decreased range of motion.  No back pain.  Psych:  No change in mood or affect. No depression or anxiety.  No memory loss.   Vital Signs BP 132/66 (BP Location: Left Arm, Patient Position: Sitting, Cuff Size: Normal)   Pulse 64   Ht 5' 6 (1.676 m)   Wt 166 lb 9.6 oz (75.6 kg)   SpO2 99%   BMI 26.89 kg/m    Physical Exam:  General- No  distress,  A&Ox3, pleasant and appropriately concerned.  ENT: No sinus tenderness, TM clear, pale nasal mucosa, no oral exudate,no post nasal drip, no LAN Cardiac: S1, S2, regular rate and rhythm, no murmur Chest: No wheeze/ rales/ dullness; no accessory muscle use, no nasal flaring, no sternal retractions, slightly diminished per bases  Abd.: Soft Non-tender, ND, BS +, Body mass index is 26.89 kg/m.  Ext: No clubbing cyanosis, edema, no obvious deformities Neuro:  normal strength, MAE x 4, A&O x 3 , appropriate Skin: No rashes, warm and dry, No obvious skin lesions  Psych: normal mood and behavior   Assessment/Plan Lung mass in left lower lobe 2x3 cm spiculated mass in left lower lobe, concerning for malignancy due to size, spiculated edges, and smoking history. Absent on scan two years ago. - Order dedicated CT scan of the chest. - Schedule robotic-assisted navigational bronchoscopy with biopsies for July 21. - Hold Plavix  for 5 days and aspirin for 48 hours prior to procedure. - Obtain cardiology clearance for holding Plavix  and aspirin. - Order PET scan post-biopsy for staging if malignancy confirmed. - Consider PFT's at follow up I have placed an order for a bronchoscopy with biopsies.  We have discussed the procedure in detail.  We have reviewed the risks and benefits of the procedure. These include bleeding, infection, puncture of the lung, and adverse reaction to anesthesia. You have agreed to proceed with biopsy to evaluate the left lower lobe nodule. Your procedure will be done by Dr. Lamar Chris. You will receive a letter today with date time and information pertaining to the procedure. You will need someone to drive you to the procedure, stay with you during the procedure, and stay with you after the procedure. You will also need someone to stay with you for 24 hours after anesthesia to ensure you have  cleared and are doing well. You will follow-up with me 1 week after the  procedure to review the results and to ensure you are doing well. Call if you need us  prior to the procedure or if you have any questions at all. Please contact office for sooner follow up if symptoms do not improve or worsen or seek emergency care     You can receive free nicotine replacement therapy (patches, gum, or mints) by calling 1-800-QUIT NOW. Please call so we can get you on the path to becoming a non-smoker. I know it is hard, but you can do this!  Hypnosis for smoking cessation  [employer] [phone]  Acupuncture for smoking cessation  [employer] [phone]    Smoking Long-standing smoking history contributing to lung mass concern. - Discuss smoking cessation strategies at follow-up.See above  Coronary artery disease with stent placement Coronary artery disease with stent placement in 2005. On Plavix  and aspirin. - Consult cardiologist for clearance to hold Plavix  and aspirin.  - Refer to ENT for evaluation of epistaxis.  I spent 35 minutes dedicated to the care of this patient on the date of this encounter to include pre-visit review of records, face-to-face time with the patient discussing conditions above, post visit ordering of testing, clinical documentation with the electronic health record, making appropriate referrals as documented, and communicating necessary information to the patient's healthcare team.    Lauraine JULIANNA Lites, NP 04/29/2024  1:37 PM

## 2024-04-29 NOTE — Telephone Encounter (Signed)
 Patient was given the letter by the nurse will send to [facility] for the auth

## 2024-04-29 NOTE — Telephone Encounter (Signed)
 Spoke with Kyra at [facility] to put in pharmacy clearance for patients plavix  to hold 5 days prior to procedure.

## 2024-04-29 NOTE — Progress Notes (Signed)
 History of Present Illness Nathan [name] is a 63 y.o. male current every day smoker , also uses chewing tobacco, referred for an abnormal CT Chest by his cardiologist.    04/29/2024  Test Results:     Latest Ref Rng & Units 01/30/2021   12:25 PM 05/18/2013   10:55 AM  CBC  WBC 4.0 - 10.5 K/uL 6.0  8.7   Hemoglobin 13.0 - 17.0 g/dL 84.2  84.3   Hematocrit 39.0 - 52.0 % 44.0  43.3   Platelets 150 - 400 K/uL 246  270        Latest Ref Rng & Units 01/18/2022    2:39 PM 01/30/2021   12:25 PM 05/18/2013   10:55 AM  BMP  Glucose 70 - 99 mg/dL 847  875  91   BUN 6 - 20 mg/dL 20  24  13    Creatinine 0.61 - 1.24 mg/dL 8.96  8.77  9.16   Sodium 135 - 145 mmol/L 138  139  138   Potassium 3.5 - 5.1 mmol/L 3.5  3.3  4.1   Chloride 98 - 111 mmol/L 101  102  100   CO2 22 - 32 mmol/L 27  24  31    Calcium  8.9 - 10.3 mg/dL 8.9  9.2  9.5     BNP No results found for: BNP  ProBNP No results found for: PROBNP  PFT No results found for: FEV1PRE, FEV1POST, FVCPRE, FVCPOST, TLC, DLCOUNC, PREFEV1FVCRT, PSTFEV1FVCRT  No results found.   Past medical hx Past Medical History:  Diagnosis Date   Anxiety    CAD (coronary artery disease)    Diabetes mellitus    Hepatic steatosis    Hypercholesteremia    Hypertension    Myocardial infarction (HCC) 10/18/2003     Social History   Tobacco Use   Smoking status: Every Day    Current packs/day: 1.50    Average packs/day: 1.5 packs/day for 35.0 years (52.5 ttl pk-yrs)    Types: Cigarettes   Smokeless tobacco: Current    Types: Snuff   Tobacco comments:    1 pack a day 04/29/2024 KRD  Vaping Use   Vaping status: Never Used  Substance Use Topics   Alcohol use: Yes    Alcohol/week: 0.0 standard drinks of alcohol    Comment: occasionally on weekends   Drug use: No    Mr.Bensinger reports that he has been smoking cigarettes. He has a 52.5 pack-year smoking history. His smokeless tobacco use includes snuff. He reports  current alcohol use. He reports that he does not use drugs.  Tobacco Cessation: Ready to quit: Not Answered Counseling given: Not Answered Tobacco comments: 1 pack a day 04/29/2024 KRD   Past surgical hx, Family hx, Social hx all reviewed.  Current Outpatient Medications on File Prior to Visit  Medication Sig   acetaminophen (TYLENOL) 500 MG tablet Take 1,000 mg by mouth as needed. For pain   aspirin EC 81 MG tablet Take 81 mg by mouth at bedtime.   augmented betamethasone dipropionate (DIPROLENE-AF) 0.05 % cream Apply 1 application. topically 2 (two) times daily as needed (skin itching/irritation.).   clopidogrel  (PLAVIX ) 75 MG tablet TAKE 1 TABLET BY MOUTH EVERY DAY (Patient taking differently: Take 75 mg by mouth at bedtime.)   hydrochlorothiazide (HYDRODIURIL) 25 MG tablet Take 25 mg by mouth in the morning.   metFORMIN (GLUCOPHAGE) 1000 MG tablet Take 1,000 mg by mouth 2 (two) times daily.   metoprolol (LOPRESSOR) 50 MG tablet  Take 50 mg by mouth 2 (two) times daily.    nitroGLYCERIN  (NITROSTAT ) 0.4 MG SL tablet Place 1 tablet (0.4 mg total) under the tongue every 5 (five) minutes as needed for chest pain. Max 3 dose   olmesartan  (BENICAR ) 40 MG tablet Take 1 tablet (40 mg total) by mouth in the morning.   rosuvastatin  (CRESTOR ) 10 MG tablet Take 1 tablet (10 mg total) by mouth daily.   escitalopram (LEXAPRO) 10 MG tablet Take 10 mg by mouth at bedtime.   JANUMET 50-1000 MG tablet Take by mouth.   No current facility-administered medications on file prior to visit.     No Known Allergies  Review Of Systems:  Constitutional:   No  weight loss, night sweats,  Fevers, chills, fatigue, or  lassitude.  HEENT:   No headaches,  Difficulty swallowing,  Tooth/dental problems, or  Sore throat,                No sneezing, itching, ear ache, nasal congestion, post nasal drip,   CV:  No chest pain,  Orthopnea, PND, swelling in lower extremities, anasarca, dizziness, palpitations, syncope.    GI  No heartburn, indigestion, abdominal pain, nausea, vomiting, diarrhea, change in bowel habits, loss of appetite, bloody stools.   Resp: No shortness of breath with exertion or at rest.  No excess mucus, no productive cough,  No non-productive cough,  No coughing up of blood.  No change in color of mucus.  No wheezing.  No chest wall deformity  Skin: no rash or lesions.  GU: no dysuria, change in color of urine, no urgency or frequency.  No flank pain, no hematuria   MS:  No joint pain or swelling.  No decreased range of motion.  No back pain.  Psych:  No change in mood or affect. No depression or anxiety.  No memory loss.   Vital Signs BP 132/66 (BP Location: Left Arm, Patient Position: Sitting, Cuff Size: Normal)   Pulse 64   Ht 5' 6 (1.676 m)   Wt 166 lb 9.6 oz (75.6 kg)   SpO2 99%   BMI 26.89 kg/m    Physical Exam:  General- No distress,  A&Ox3 ENT: No sinus tenderness, TM clear, pale nasal mucosa, no oral exudate,no post nasal drip, no LAN Cardiac: S1, S2, regular rate and rhythm, no murmur Chest: No wheeze/ rales/ dullness; no accessory muscle use, no nasal flaring, no sternal retractions Abd.: Soft Non-tender Ext: No clubbing cyanosis, edema Neuro:  normal strength Skin: No rashes, warm and dry Psych: normal mood and behavior   Assessment/Plan  No problem-specific Assessment & Plan notes found for this encounter.    Lauraine JULIANNA Lites, NP 04/29/2024  1:34 PM

## 2024-04-29 NOTE — Telephone Encounter (Signed)
   Pre-operative Risk Assessment    Patient Name: Nathan [name]  DOB: Feb 17, 1961 MRN: [id number]   Date of last office visit:  Date of next office visit:    Request for Surgical Clearance    Procedure:  Bronchoscopy with Biopsy  Date of Surgery:  05-06-24                                Surgeon:  Dr Lamar Chris Surgeon's Group or Practice Name:   Phone number:  [phone] Fax number:  [phone]   Type of Clearance Requested:   - Pharmacy:  Hold Clopidogrel  (Plavix ) held for 5 days   Type of Anesthesia:  General    Additional requests/questions:    Bonney Kyra CHRISTELLA Jenel   04/29/2024, 2:07 PM

## 2024-04-29 NOTE — Telephone Encounter (Signed)
 Please schedule the following:  Provider performing procedure: Byrum Diagnosis:  Lung mass Which side if for nodule / mass? Left lower lobe Procedure: navigational bronchoscopy with biopsies  Has patient been spoken to by Provider and given informed consent? Yes by Lauraine Lites, NP Anesthesia:  General Do you need Fluro?  Yes Duration of procedure:  1.5 hours Date: 05/06/2024 Alternate Date: 05/13/2024  Time: Last slot on 7/21 is the earliest slot Location:  [facility] Does patient have OSA? No DM? Yes Metformin BID Or Latex allergy?  No Medication Restriction/ Anticoagulate/Antiplatelet:  Plavix  and ASA. Plavix  hold x 5 days, ASA hold x 48 hours Pre-op Labs Ordered:determined by Anesthesia Imaging request:  Super D CT Chest has been ordered  (If, SuperDimension CT Chest, please have STAT courier sent to ENDO)

## 2024-04-30 NOTE — Telephone Encounter (Signed)
 Patient is scheduled for Bronch on 7/21

## 2024-04-30 NOTE — Telephone Encounter (Signed)
 Should be good since it is done before.Dr.Byrum will have access to it on day of procedure.NFN

## 2024-05-02 ENCOUNTER — Encounter (HOSPITAL_COMMUNITY): Payer: Self-pay | Admitting: Emergency Medicine

## 2024-05-02 ENCOUNTER — Other Ambulatory Visit: Payer: Self-pay

## 2024-05-02 NOTE — Pre-Procedure Instructions (Signed)
-------------    SDW INSTRUCTIONS given:  Your procedure is scheduled on 7/21.  Report to [facility] Main Entrance A at 05:30 A.M., and check in at the Admitting office.  Any questions or running late day of surgery: call [phone]    Remember:  Do not eat or drink after midnight the night before your surgery    Take these medicines the morning of surgery with A SIP OF WATER   metoprolol, rosuvastatin            May take these medicines IF NEEDED: Tylenol, nitro   Hold ASA 2 days. Last dose 7/18. Hold Plavix  5 days. Last dose 7/15.    As of today, STOP taking any Aleve, Naproxen, Ibuprofen, Motrin, Advil, Goody's, BC's, all herbal medications, fish oil, and all vitamins.   Do NOT Smoke (Tobacco/Vaping) 24 hours prior to your procedure  If you use a CPAP at night, you may bring all equipment for your overnight stay.     You will be asked to remove any contacts, glasses, piercing's, hearing aid's, dentures/partials prior to surgery. Please bring cases for these items if needed.     Patients discharged the day of surgery will not be allowed to drive home, and someone needs to stay with them for 24 hours.  SURGICAL WAITING ROOM VISITATION Patients may have no more than 2 support people in the waiting area - these visitors may rotate.   Pre-op nurse will coordinate an appropriate time for 1 ADULT support person, who may not rotate, to accompany patient in pre-op.  Children under the age of 79 must have an adult with them who is not the patient and must remain in the main waiting area with an adult.  If the patient needs to stay at the hospital during part of their recovery, the visitor guidelines for inpatient rooms apply.  Please refer to the [facility] website for the visitor guidelines for any additional information.   Special instructions:   [facility]- Preparing For Surgery   Please follow these instructions carefully.   Shower the NIGHT BEFORE SURGERY and the  MORNING OF SURGERY with DIAL Soap.   Pat yourself dry with a CLEAN TOWEL.  Wear CLEAN PAJAMAS to bed the night before surgery  Place CLEAN SHEETS on your bed the night of your first shower and DO NOT SLEEP WITH PETS.   Additional instructions for the day of surgery: DO NOT APPLY any lotions, deodorants, cologne, or perfumes.   Do not wear jewelry or makeup Do not wear nail polish, gel polish, artificial nails, or any other type of covering on natural nails (fingers and toes) Do not bring valuables to the hospital. [facility] is not responsible for valuables/personal belongings. Put on clean/comfortable clothes.  Please brush your teeth.  Ask your nurse before applying any prescription medications to the skin.

## 2024-05-02 NOTE — Progress Notes (Signed)
 PCP - Dr. Norleen General Cardiologist - Dr. Vinie Maxcy  PPM/ICD - denies   Chest x-ray - 01/30/21 EKG - 04/09/24 Stress Test - 03/25/24 ECHO - 01/12/12 Cardiac Cath - 01/03/2005  CPAP - denies  Fasting Blood Sugar - 100-120 Checks Blood Sugar once/day  Blood Thinner Instructions: Hold Plavix  5 days. Last dose 7/15 Aspirin Instructions: Hold 2 days. Last dose 7/18  ERAS Protcol - no, NPO  COVID TEST- n/a  Anesthesia review: yes, cardiac hx  Patient verbally denies any shortness of breath, fever, cough and chest pain during phone call      Questions were answered. Patient verbalized understanding of instructions.

## 2024-05-02 NOTE — Progress Notes (Signed)
 Anesthesia Chart Review: Same day workup  63 year old male follows with cardiology for history of HLD, HTN, ongoing tobacco abuse, CAD s/p inferior MI with stent to the mid RCA in 2005.  He was last seen by Dr. 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  Nuclear stress 03/25/2024 was negative for ischemia and showed LVEF 72%.  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  He was referred to pulmonology for further evaluation.  He was also subsequently cleared to hold Plavix  and telephone encounter by Raphael Bring, PA-C 04/29/2024, Chart reviewed as part of pre-operative protocol coverage.  Pharmacy clearance requested to hold clopidogrel  x 5 days prior to pulmonary procedure due to need for biopsy of concerning lung nodule. Per office perioperative DAPT algorithm, patient meets criteria to hold this as requested. Additionally, he had a recent OV 03/2024 felt reassuring from cardiac standpoint. Will route this bundled recommendation to requesting provider via Epic fax function. Please call with questions.  Other pertinent history includes non-insulin-dependent DM2.  Patient will need day of surgery labs and evaluation.  EKG 04/09/2024: NSR.  Rate 62.  Nuclear stress 03/25/2024:    The study is normal. The study is low risk.   No ST deviation was noted.   LV perfusion is normal. There is no evidence of ischemia. There is no evidence of infarction.   Left ventricular function is normal. Nuclear stress EF: 72%. The left ventricular ejection fraction is hyperdynamic (>65%). End diastolic cavity size is normal. End systolic cavity size is normal.   CT images were obtained for attenuation correction and were examined for the presence of coronary calcium  when appropriate.   Coronary calcium  assessment not performed due to prior revascularization.   Prior study available for comparison from  04/18/2022.   Electronically signed by Lonni Nanas, MD   Fixed inferior perfusion defect with normal wall motion, consistent with artifact LVEF 72% Low risk study    Naim, Murtha [facility] Short Stay Center/Anesthesiology Phone [phone] 05/02/2024 11:35 AM

## 2024-05-02 NOTE — Anesthesia Preprocedure Evaluation (Addendum)
 Anesthesia Evaluation  Patient identified by MRN, date of birth, ID band Patient awake    Reviewed: Allergy & Precautions, NPO status , Patient's Chart, lab work & pertinent test results  Airway Mallampati: II  TM Distance: >3 FB Neck ROM: Full    Dental no notable dental hx.    Pulmonary neg pulmonary ROS, Current Smoker and Patient abstained from smoking. Pulmonary mass   Pulmonary exam normal        Cardiovascular hypertension, Pt. on medications and Pt. on home beta blockers + CAD, + Past MI (01/05) and + Cardiac Stents   Rhythm:Regular Rate:Normal     Neuro/Psych   Anxiety     negative neurological ROS     GI/Hepatic negative GI ROS, Neg liver ROS,,,  Endo/Other  diabetes    Renal/GU negative Renal ROS  negative genitourinary   Musculoskeletal negative musculoskeletal ROS (+)    Abdominal Normal abdominal exam  (+)   Peds  Hematology Lab Results      Component                Value               Date                      WBC                      10.2                05/06/2024                HGB                      15.2                05/06/2024                HCT                      43.2                05/06/2024                MCV                      89.4                05/06/2024                PLT                      278                 05/06/2024              Anesthesia Other Findings   Reproductive/Obstetrics                              Anesthesia Physical Anesthesia Plan  ASA: 3  Anesthesia Plan: General   Post-op Pain Management:    Induction: Intravenous  PONV Risk Score and Plan: 1 and Ondansetron , Dexamethasone , Midazolam  and Treatment may vary due to age or medical condition  Airway Management Planned: Mask and Oral ETT  Additional Equipment: None  Intra-op Plan:   Post-operative Plan:   Informed Consent: I have reviewed the patients  History and  Physical, chart, labs and discussed the procedure including the risks, benefits and alternatives for the proposed anesthesia with the patient or authorized representative who has indicated his/her understanding and acceptance.     Dental advisory given  Plan Discussed with: CRNA  Anesthesia Plan Comments: (PAT note by Lynwood Hope, PA-C: 63 year old male follows with cardiology for history of HLD, HTN, ongoing tobacco abuse, CAD s/p inferior MI with stent to the mid RCA in 2005.  He was last seen by Dr. 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  Nuclear stress 03/25/2024 was negative for ischemia and showed LVEF 72%.  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  He was referred to pulmonology for further evaluation.  He was also subsequently cleared to hold Plavix  and telephone encounter by Raphael Bring, PA-C 04/29/2024, Chart reviewed as part of pre-operative protocol coverage.  Pharmacy clearance requested to hold clopidogrel  x 5 days prior to pulmonary procedure due to need for biopsy of concerning lung nodule. Per office perioperative DAPT algorithm, patient meets criteria to hold this as requested. Additionally, he had a recent OV 03/2024 felt reassuring from cardiac standpoint. Will route this bundled recommendation to requesting provider via Epic fax function. Please call with questions.  Other pertinent history includes non-insulin -dependent DM2.  Patient will need day of surgery labs and evaluation.  EKG 04/09/2024: NSR.  Rate 62.  Nuclear stress 03/25/2024:    The study is normal. The study is low risk.   No ST deviation was noted.   LV perfusion is normal. There is no evidence of ischemia. There is no evidence of infarction.   Left ventricular function is normal. Nuclear stress EF: 72%. The left ventricular ejection fraction is hyperdynamic (>65%). End diastolic cavity  size is normal. End systolic cavity size is normal.   CT images were obtained for attenuation correction and were examined for the presence of coronary calcium  when appropriate.   Coronary calcium  assessment not performed due to prior revascularization.   Prior study available for comparison from 04/18/2022.   Electronically signed by Lonni Nanas, MD  1. Fixed inferior perfusion defect with normal wall motion, consistent with artifact 2. LVEF 72% 3. Low risk study  )         Anesthesia Quick Evaluation

## 2024-05-04 ENCOUNTER — Ambulatory Visit (HOSPITAL_BASED_OUTPATIENT_CLINIC_OR_DEPARTMENT_OTHER)
Admission: RE | Admit: 2024-05-04 | Discharge: 2024-05-04 | Disposition: A | Discharge status: Home / Self Care | Source: Ambulatory Visit | Attending: Acute Care | Admitting: Acute Care

## 2024-05-04 DIAGNOSIS — R918 Other nonspecific abnormal finding of lung field: Secondary | ICD-10-CM | POA: Insufficient documentation

## 2024-05-06 ENCOUNTER — Ambulatory Visit (HOSPITAL_COMMUNITY)
Admission: RE | Admit: 2024-05-06 | Discharge: 2024-05-06 | Disposition: A | Discharge status: Home / Self Care | Attending: Emergency Medicine | Admitting: Emergency Medicine

## 2024-05-06 ENCOUNTER — Ambulatory Visit (HOSPITAL_COMMUNITY): Payer: Self-pay | Admitting: Physician Assistant

## 2024-05-06 ENCOUNTER — Ambulatory Visit (HOSPITAL_COMMUNITY)

## 2024-05-06 ENCOUNTER — Encounter (HOSPITAL_COMMUNITY): Payer: Self-pay | Admitting: Emergency Medicine

## 2024-05-06 ENCOUNTER — Encounter (HOSPITAL_COMMUNITY)
Admission: RE | Disposition: A | Discharge status: Home / Self Care | Payer: Self-pay | Source: Home / Self Care | Attending: Emergency Medicine

## 2024-05-06 DIAGNOSIS — Z7982 Long term (current) use of aspirin: Secondary | ICD-10-CM | POA: Diagnosis not present

## 2024-05-06 DIAGNOSIS — R918 Other nonspecific abnormal finding of lung field: Secondary | ICD-10-CM | POA: Diagnosis present

## 2024-05-06 DIAGNOSIS — I10 Essential (primary) hypertension: Secondary | ICD-10-CM | POA: Insufficient documentation

## 2024-05-06 DIAGNOSIS — Z85828 Personal history of other malignant neoplasm of skin: Secondary | ICD-10-CM | POA: Insufficient documentation

## 2024-05-06 DIAGNOSIS — I2510 Atherosclerotic heart disease of native coronary artery without angina pectoris: Secondary | ICD-10-CM | POA: Insufficient documentation

## 2024-05-06 DIAGNOSIS — Z955 Presence of coronary angioplasty implant and graft: Secondary | ICD-10-CM | POA: Insufficient documentation

## 2024-05-06 DIAGNOSIS — F17210 Nicotine dependence, cigarettes, uncomplicated: Secondary | ICD-10-CM | POA: Insufficient documentation

## 2024-05-06 DIAGNOSIS — E785 Hyperlipidemia, unspecified: Secondary | ICD-10-CM | POA: Insufficient documentation

## 2024-05-06 DIAGNOSIS — R040 Epistaxis: Secondary | ICD-10-CM | POA: Insufficient documentation

## 2024-05-06 DIAGNOSIS — E119 Type 2 diabetes mellitus without complications: Secondary | ICD-10-CM | POA: Diagnosis not present

## 2024-05-06 DIAGNOSIS — C3432 Malignant neoplasm of lower lobe, left bronchus or lung: Secondary | ICD-10-CM | POA: Insufficient documentation

## 2024-05-06 DIAGNOSIS — Z7902 Long term (current) use of antithrombotics/antiplatelets: Secondary | ICD-10-CM | POA: Diagnosis not present

## 2024-05-06 HISTORY — PX: VIDEO BRONCHOSCOPY WITH ENDOBRONCHIAL NAVIGATION: SHX6175

## 2024-05-06 LAB — BASIC METABOLIC PANEL WITH GFR
Anion gap: 12 (ref 5–15)
BUN: 14 mg/dL (ref 8–23)
CO2: 26 mmol/L (ref 22–32)
Calcium: 9.2 mg/dL (ref 8.9–10.3)
Chloride: 100 mmol/L (ref 98–111)
Creatinine, Ser: 0.84 mg/dL (ref 0.61–1.24)
GFR, Estimated: >60 mL/min (ref >60)
Glucose, Bld: 120 mg/dL — ABNORMAL HIGH (ref 70–99)
Potassium: 3.7 mmol/L (ref 3.5–5.1)
Sodium: 138 mmol/L (ref 135–145)

## 2024-05-06 LAB — CBC
HCT: 43.2 % (ref 39.0–52.0)
Hemoglobin: 15.2 g/dL (ref 13.0–17.0)
MCH: 31.5 pg (ref 26.0–34.0)
MCHC: 35.2 g/dL (ref 30.0–36.0)
MCV: 89.4 fL (ref 80.0–100.0)
Platelets: 278 K/uL (ref 150–400)
RBC: 4.83 MIL/uL (ref 4.22–5.81)
RDW: 12.4 % (ref 11.5–15.5)
WBC: 10.2 K/uL (ref 4.0–10.5)
nRBC: 0 % (ref 0.0–0.2)

## 2024-05-06 LAB — GLUCOSE, CAPILLARY
Glucose-Capillary: 110 mg/dL — ABNORMAL HIGH (ref 70–99)
Glucose-Capillary: 126 mg/dL — ABNORMAL HIGH (ref 70–99)

## 2024-05-06 SURGERY — VIDEO BRONCHOSCOPY WITH ENDOBRONCHIAL NAVIGATION
Anesthesia: General | Laterality: Left

## 2024-05-06 MED ORDER — INSULIN ASPART 100 UNIT/ML IJ SOLN: 0.0000 [IU] | INTRAMUSCULAR | Status: DC | PRN

## 2024-05-06 MED ORDER — SUGAMMADEX SODIUM 200 MG/2ML IV SOLN
INTRAVENOUS | Status: DC | PRN
  Administered 2024-05-06: 200 mg via INTRAVENOUS

## 2024-05-06 MED ORDER — ROCURONIUM BROMIDE 10 MG/ML (PF) SYRINGE
PREFILLED_SYRINGE | INTRAVENOUS | Status: DC | PRN
  Administered 2024-05-06: 50 mg via INTRAVENOUS

## 2024-05-06 MED ORDER — LIDOCAINE 2% (20 MG/ML) 5 ML SYRINGE
INTRAMUSCULAR | Status: DC | PRN
  Administered 2024-05-06: 70 mg via INTRAVENOUS

## 2024-05-06 MED ORDER — PROPOFOL 500 MG/50ML IV EMUL
INTRAVENOUS | Status: DC | PRN
Start: 2024-05-06 — End: 2024-05-06
  Administered 2024-05-06: 150 ug/kg/min via INTRAVENOUS

## 2024-05-06 MED ORDER — ONDANSETRON HCL 4 MG/2ML IJ SOLN
INTRAMUSCULAR | Status: DC | PRN
  Administered 2024-05-06: 4 mg via INTRAVENOUS

## 2024-05-06 MED ORDER — CHLORHEXIDINE GLUCONATE 0.12 % MT SOLN
OROMUCOSAL | Status: AC
  Administered 2024-05-06: 15 mL via OROMUCOSAL
  Filled 2024-05-06: qty 15

## 2024-05-06 MED ORDER — LACTATED RINGERS IV SOLN: INTRAVENOUS | Status: DC

## 2024-05-06 MED ORDER — DEXAMETHASONE SODIUM PHOSPHATE 10 MG/ML IJ SOLN
INTRAMUSCULAR | Status: DC | PRN
  Administered 2024-05-06: 10 mg via INTRAVENOUS

## 2024-05-06 MED ORDER — FENTANYL CITRATE (PF) 100 MCG/2ML IJ SOLN: 25.0000 ug | INTRAMUSCULAR | Status: DC | PRN

## 2024-05-06 MED ORDER — ACETAMINOPHEN 10 MG/ML IV SOLN: 1000.0000 mg | Freq: Once | INTRAVENOUS | Status: DC | PRN

## 2024-05-06 MED ORDER — CHLORHEXIDINE GLUCONATE 0.12 % MT SOLN: 15.0000 mL | Freq: Once | OROMUCOSAL | Status: AC

## 2024-05-06 MED ORDER — PROPOFOL 10 MG/ML IV BOLUS
INTRAVENOUS | Status: DC | PRN
  Administered 2024-05-06: 150 mg via INTRAVENOUS

## 2024-05-06 SURGICAL SUPPLY — 36 items
ADAPTER BRONCHOSCOPE OLYMPUS (ADAPTER) ×1 IMPLANT
ADAPTER VALVE BIOPSY EBUS (MISCELLANEOUS) IMPLANT
BAG COUNTER SPONGE SURGICOUNT (BAG) ×1 IMPLANT
BRUSH CYTOL CELLEBRITY 1.5X140 (MISCELLANEOUS) ×1 IMPLANT
BRUSH SUPERTRAX BIOPSY (INSTRUMENTS) IMPLANT
BRUSH SUPERTRAX NDL-TIP CYTO (INSTRUMENTS) ×1 IMPLANT
CANISTER SUCTION 3000ML PPV (SUCTIONS) ×1 IMPLANT
CNTNR URN SCR LID CUP LEK RST (MISCELLANEOUS) ×1 IMPLANT
COVER BACK TABLE 60X90IN (DRAPES) ×1 IMPLANT
FILTER STRAW FLUID ASPIR (MISCELLANEOUS) IMPLANT
FORCEPS BIOP 1.5 SINGLE USE (MISCELLANEOUS) ×1 IMPLANT
FORCEPS BIOP SUPERTRX PREMAR (INSTRUMENTS) ×1 IMPLANT
GAUZE SPONGE 4X4 12PLY STRL (GAUZE/BANDAGES/DRESSINGS) ×1 IMPLANT
GLOVE BIO SURGEON STRL SZ7.5 (GLOVE) ×2 IMPLANT
GOWN STRL REUS W/ TWL LRG LVL3 (GOWN DISPOSABLE) ×2 IMPLANT
KIT CLEAN ENDO COMPLIANCE (KITS) ×1 IMPLANT
KIT LOCATABLE GUIDE (CANNULA) IMPLANT
KIT MARKER FIDUCIAL DELIVERY (KITS) IMPLANT
KIT TURNOVER KIT B (KITS) ×1 IMPLANT
MARKER SKIN DUAL TIP RULER LAB (MISCELLANEOUS) ×1 IMPLANT
NDL SUPERTRX PREMARK BIOPSY (NEEDLE) ×1 IMPLANT
NEEDLE SUPERTRX PREMARK BIOPSY (NEEDLE) ×1 IMPLANT
NS IRRIG 1000ML POUR BTL (IV SOLUTION) ×1 IMPLANT
OIL SILICONE PENTAX (PARTS (SERVICE/REPAIRS)) ×1 IMPLANT
PAD ARMBOARD POSITIONER FOAM (MISCELLANEOUS) ×2 IMPLANT
PATCHES PATIENT (LABEL) ×3 IMPLANT
SYR 20ML ECCENTRIC (SYRINGE) ×1 IMPLANT
SYR 20ML LL LF (SYRINGE) ×1 IMPLANT
SYR 50ML SLIP (SYRINGE) ×1 IMPLANT
TOWEL GREEN STERILE FF (TOWEL DISPOSABLE) ×1 IMPLANT
TRAP SPECIMEN MUCUS 40CC (MISCELLANEOUS) IMPLANT
TUBE CONNECTING 20X1/4 (TUBING) ×1 IMPLANT
UNDERPAD 30X36 HEAVY ABSORB (UNDERPADS AND DIAPERS) ×1 IMPLANT
VALVE BIOPSY SINGLE USE (MISCELLANEOUS) ×1 IMPLANT
VALVE SUCTION BRONCHIO DISP (MISCELLANEOUS) ×1 IMPLANT
WATER STERILE IRR 1000ML POUR (IV SOLUTION) ×1 IMPLANT

## 2024-05-06 NOTE — Interval H&P Note (Signed)
 History and Physical Interval Note:  05/06/2024 7:26 AM  Nathan [name]  has presented today for surgery, with the diagnosis of Lung nodule.  The various methods of treatment have been discussed with the patient and family. After consideration of risks, benefits and other options for treatment, the patient has consented to  Procedure(s): VIDEO BRONCHOSCOPY WITH ENDOBRONCHIAL NAVIGATION (Left) as a surgical intervention.  The patient's history has been reviewed, patient examined, no change in status, stable for surgery.  I have reviewed the patient's chart and labs.  Questions were answered to the patient's satisfaction.     Lamar GORMAN Chris

## 2024-05-06 NOTE — Anesthesia Procedure Notes (Signed)
 Procedure Name: Intubation Date/Time: 05/06/2024 7:58 AM  Performed by: Mollie Olivia SAUNDERS, CRNAPre-anesthesia Checklist: Patient identified, Emergency Drugs available, Suction available and Patient being monitored Patient Re-evaluated:Patient Re-evaluated prior to induction Oxygen Delivery Method: Circle System Utilized Preoxygenation: Pre-oxygenation with 100% oxygen Induction Type: IV induction Ventilation: Mask ventilation without difficulty Laryngoscope Size: Miller and 3 Grade View: Grade I Tube type: Oral Laser Tube: Cuffed inflated with minimal occlusive pressure - saline Tube size: 8.5 mm Number of attempts: 1 Airway Equipment and Method: Stylet and Oral airway Placement Confirmation: ETT inserted through vocal cords under direct vision, positive ETCO2 and breath sounds checked- equal and bilateral Secured at: 24 cm Tube secured with: Tape Dental Injury: Teeth and Oropharynx as per pre-operative assessment

## 2024-05-06 NOTE — Discharge Instructions (Addendum)
 Flexible Bronchoscopy, Care After This sheet gives you information about how to care for yourself after your test. Your doctor may also give you more specific instructions. If you have problems or questions, contact your doctor. Follow these instructions at home: Eating and drinking When you are wide awake, your numbness is gone and your cough and gag reflexes have come back, you may: Start eating only soft foods. Slowly drink liquids. Six hours after the test, go back to your normal diet. Driving Do not drive for 24 hours if you were given a medicine to help you relax (sedative). Do not drive or use heavy machinery while taking prescription pain medicine. General instructions Take over-the-counter and prescription medicines only as told by your doctor. Return to your normal activities as told. Ask what activities are safe for you. Do not use any products that have nicotine or tobacco in them. This includes cigarettes and e-cigarettes. If you need help quitting, ask your doctor. Keep all follow-up visits as told by your doctor. This is important. It is very important if you had a tissue sample (biopsy) taken. Get help right away if: You have shortness of breath that gets worse. You get light-headed. You feel like you are going to pass out (faint). You have chest pain. You cough up: More than a little blood. More blood than before. Summary Do not use cigarettes. Do not use e-cigarettes. Seek care in the Emergency Department right away if you have chest pain or shortness of breath. Call or MyChart Message our office for any questions or problems at [phone].  Okay to restart your Plavix  and aspirin on 05/07/2024.   This information is not intended to replace advice given to you by your health care provider. Make sure you discuss any questions you have with your health care provider.

## 2024-05-06 NOTE — Op Note (Signed)
 Procedure Note  Patient: Nathan [name]  [employer] Cios mobile C-arm was utilized to identify and biopsy left lower lobe mass..  Needle-in-lesion was confirmed using real-time Cios imaging, and images were uploaded to PACS.  The initial pass showed that the needle was in the mass but overshot by approximately 1 centimeter.  Adjustments were made to better approach to the center of the mass.  Lamar Chris, MD, PhD 05/06/2024, 8:38 AM [facility] [phone] or if no answer before 7:00PM call [phone] For any issues after 7:00PM please call eLink [phone]

## 2024-05-06 NOTE — Op Note (Signed)
 Video Bronchoscopy with Robotic Assisted Bronchoscopic Navigation   Date of Operation: 05/06/2024   Pre-op Diagnosis: Left lower lobe mass  Post-op Diagnosis: Same  Surgeon: Lamar Chris  Assistants: None  Anesthesia: General endotracheal anesthesia  Operation: Flexible video fiberoptic bronchoscopy with robotic assistance and biopsies.  Estimated Blood Loss: Minimal  Complications: None  Indications and History: Nathan [name] is a 63 y.o. male with history of tobacco use, coronary disease.  He was found to have a left lower lobe mass on myocardial perfusion study in June 2025.  Recommendation made to achieve a tissue diagnosis via robotic assisted navigational bronchoscopy.  The risks, benefits, complications, treatment options and expected outcomes were discussed with the patient.  The possibilities of pneumothorax, pneumonia, reaction to medication, pulmonary aspiration, perforation of a viscus, bleeding, failure to diagnose a condition and creating a complication requiring transfusion or operation were discussed with the patient who freely signed the consent.    Description of Procedure: The patient was seen in the Preoperative Area, was examined and was deemed appropriate to proceed.  The patient was taken to [facility] Endoscopy room 3, identified as Nathan [name] and the procedure verified as Flexible Video Fiberoptic Bronchoscopy.  A Time Out was held and the above information confirmed.   Prior to the date of the procedure a high-resolution CT scan of the chest was performed. Utilizing ION software program a virtual tracheobronchial tree was generated to allow the creation of distinct navigation pathways to the patient's parenchymal abnormalities. After being taken to the operating room general anesthesia was initiated and the patient  was orally intubated. The video fiberoptic bronchoscope was introduced via the endotracheal tube and a general inspection was performed which showed  normal right and left lung anatomy. Aspiration of the bilateral mainstems was completed to remove any remaining secretions. Robotic catheter inserted into patient's endotracheal tube.   Target #1 left lower lobe mass: The distinct navigation pathways prepared prior to this procedure were then utilized to navigate to patient's lesion identified on CT scan. The robotic catheter was secured into place and the vision probe was withdrawn.  Lesion location was approximated using fluoroscopy.  Local registration and targeting was performed using [employer] Cios mobile C-arm three-dimensional imaging. Under fluoroscopic guidance transbronchial needle brushings, transbronchial needle biopsies, and transbronchial forceps biopsies were performed to be sent for cytology and pathology.  Needle-in-lesion was confirmed using Cios mobile C-arm.  This imaging confirmed that on initial introduction the needle was in good position but overshot the lesion slightly.  Appropriate adjustments were made based on imaging.     At the end of the procedure a general airway inspection was performed and there was no evidence of active bleeding. The bronchoscope was removed.  The patient tolerated the procedure well. There was no significant blood loss and there were no obvious complications. A post-procedural chest x-ray is pending.  Samples Target #1: 1. Transbronchial needle brushings from left lower lobe mass 2. Transbronchial Wang needle biopsies from left lower lobe mass 3. Transbronchial forceps biopsies from left lower lobe mass   Plans:  The patient will be discharged from the PACU to home when recovered from anesthesia and after chest x-ray is reviewed. We will review the cytology, pathology and microbiology results with the patient when they become available. Outpatient followup will be with Nathan Lites, NP.    Lamar Chris, MD, PhD 05/06/2024, 8:36 AM Nathan [name] [phone] or if no  answer before 7:00PM call [phone] For  any issues after 7:00PM please call eLink [phone]

## 2024-05-06 NOTE — Anesthesia Postprocedure Evaluation (Signed)
 Anesthesia Post Note  Patient: Nathan [name]  Procedure(s) Performed: VIDEO BRONCHOSCOPY WITH ENDOBRONCHIAL NAVIGATION (Left)     Patient location during evaluation: PACU Anesthesia Type: General Level of consciousness: awake and alert Pain management: pain level controlled Vital Signs Assessment: post-procedure vital signs reviewed and stable Respiratory status: spontaneous breathing, nonlabored ventilation, respiratory function stable and patient connected to nasal cannula oxygen Cardiovascular status: blood pressure returned to baseline and stable Postop Assessment: no apparent nausea or vomiting Anesthetic complications: no   No notable events documented.  Last Vitals:  Vitals:   05/06/24 0900 05/06/24 0910  BP: 91/70 108/68  Pulse: 61 60  Resp: (!) 24 19  Temp:  37 C  SpO2: 93% 93%    Last Pain:  Vitals:   05/06/24 0910  TempSrc:   PainSc: 0-No pain                 Cordella P Shyanne Mcclary

## 2024-05-06 NOTE — Transfer of Care (Signed)
 Immediate Anesthesia Transfer of Care Note  Patient: Nathan [name]  Procedure(s) Performed: VIDEO BRONCHOSCOPY WITH ENDOBRONCHIAL NAVIGATION (Left)  Patient Location: PACU  Anesthesia Type:General  Level of Consciousness: awake, alert , and oriented  Airway & Oxygen Therapy: Patient Spontanous Breathing and Patient connected to face mask oxygen  Post-op Assessment: Report given to RN and Post -op Vital signs reviewed and stable  Post vital signs: Reviewed and stable  Last Vitals:  Vitals Value Taken Time  BP    Temp    Pulse    Resp    SpO2      Last Pain:  Vitals:   05/06/24 0607  TempSrc:   PainSc: 0-No pain         Complications: No notable events documented.

## 2024-05-07 ENCOUNTER — Encounter (HOSPITAL_COMMUNITY): Payer: Self-pay | Admitting: Emergency Medicine

## 2024-05-10 ENCOUNTER — Ambulatory Visit: Payer: Self-pay | Admitting: Internal Medicine

## 2024-05-10 ENCOUNTER — Encounter (HOSPITAL_COMMUNITY)
Admission: RE | Admit: 2024-05-10 | Discharge: 2024-05-10 | Disposition: A | Discharge status: Home / Self Care | Source: Ambulatory Visit | Attending: Internal Medicine | Admitting: Internal Medicine

## 2024-05-10 DIAGNOSIS — R918 Other nonspecific abnormal finding of lung field: Secondary | ICD-10-CM | POA: Insufficient documentation

## 2024-05-10 DIAGNOSIS — R9389 Abnormal findings on diagnostic imaging of other specified body structures: Secondary | ICD-10-CM | POA: Insufficient documentation

## 2024-05-10 LAB — CYTOLOGY - NON PAP

## 2024-05-10 LAB — GLUCOSE, CAPILLARY: Glucose-Capillary: 106 mg/dL — ABNORMAL HIGH (ref 70–99)

## 2024-05-10 MED ORDER — FLUDEOXYGLUCOSE F - 18 (FDG) INJECTION
8.3000 | Freq: Once | INTRAVENOUS | Status: AC
  Administered 2024-05-10: 8.72 via INTRAVENOUS

## 2024-05-13 ENCOUNTER — Ambulatory Visit: Admitting: Acute Care

## 2024-05-13 ENCOUNTER — Other Ambulatory Visit: Payer: Self-pay | Admitting: Acute Care

## 2024-05-13 ENCOUNTER — Encounter: Payer: Self-pay | Admitting: Acute Care

## 2024-05-13 VITALS — BP 126/64 | HR 67 | Temp 97.9°F | Ht 66.0 in | Wt 165.4 lb

## 2024-05-13 DIAGNOSIS — C3490 Malignant neoplasm of unspecified part of unspecified bronchus or lung: Secondary | ICD-10-CM

## 2024-05-13 DIAGNOSIS — F17210 Nicotine dependence, cigarettes, uncomplicated: Secondary | ICD-10-CM

## 2024-05-13 DIAGNOSIS — Z9889 Other specified postprocedural states: Secondary | ICD-10-CM

## 2024-05-13 DIAGNOSIS — J449 Chronic obstructive pulmonary disease, unspecified: Secondary | ICD-10-CM | POA: Diagnosis not present

## 2024-05-13 DIAGNOSIS — J069 Acute upper respiratory infection, unspecified: Secondary | ICD-10-CM | POA: Diagnosis not present

## 2024-05-13 MED ORDER — DOXYCYCLINE HYCLATE 100 MG PO TABS: 100.0000 mg | ORAL_TABLET | Freq: Two times a day (BID) | ORAL | 0 refills | Status: DC

## 2024-05-13 NOTE — Patient Instructions (Addendum)
 It is good to see you today. I am glad you have done well after your bronchoscopy with biopsies. We have reviewed the results of your biopsy and they were positive for a non-small cell lung cancer. I have referred you to medical oncology to be further evaluated for treatment options. We have ordered an MRI of the brain this will be done at [facility] to complete staging. I have ordered pulmonary function test to evaluate you for COPD. These will also help us  determine if you are surgical candidate should that be a portion of your treatment options. You will get phone calls to get the consults and the MRI brain, and pulmonary function tests scheduled. Good luck with your therapy and treatment You will get great care at the lung cancer center Call if you need us  sooner Follow-up after your cancer treatment so that we can talk to you about possibly starting some maintenance inhalers for your COPD if pulmonary function tests determine you have COPD. Please work on quitting smoking completely. This is very important. Call us  if you need us  for anything. Please contact office for sooner follow up if symptoms do not improve or worsen or seek emergency care   You can receive free nicotine replacement therapy (patches, gum, or mints) by calling 1-800-QUIT NOW. Please call so we can get you on the path to becoming a non-smoker. I know it is hard, but you can do this!  Hypnosis for smoking cessation  [employer] [phone]  Acupuncture for smoking cessation  [employer] [phone]

## 2024-05-13 NOTE — Progress Notes (Signed)
 History of Present Illness Nathan [name] is a 63 y.o. male current every day smoker , also uses chewing tobacco, referred for an abnormal CT Chest by his cardiologist for abnormal chest imaging.   05/13/2024 Pt. Presents for follow up after bronchoscopy with biopsies. He states he has done well since the procedure. No bleeding, infection/ fever/ shortness of breath or adverse reaction to anesthesia.   We have reviewed the results of the patient's bronchoscopy and biopsy.  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  Sampling was a complex tumor with overlapping morphology suggesting both adeno carcinoma and squamous cell components.  Patient has subsequently had a PET scan done.  PET scan shows Isolated left hypermetabolic prominent hilar lymph node worrisome for regional spread of disease.  There is additional tissue available for genetic and molecular sampling.  After speaking with the patient and his daughters I have referred the patient to medical oncology.  They prefer to have their care done in [place].  I am unsure if radiation will be a part of his treatment therapy, I have messaged Dr. Jeannett nurse navigator to ask her if I should also place an order for radiation oncology.  I have ordered an MRI brain with and without contrast to complete staging  Patient does have significant congestion and cough today.  I am going to treat with doxycycline  to ensure he is not developing an upper airway infection.  Patient is a current every day smoker , I spent 3-4 minutes counseling patient on  steps to stop use of tobacco products. I have provided patient with information on receiving free nicotine replacement therapy, and contact numbers for hypnosis for smoking cessation as well as acupuncture for smoking cessation.   Test Results: Cytology 05/06/2024 A. LUNG, LLL, FINE NEEDLE ASPIRATION: - Non-small cell carcinoma. See Comment. B. LUNG, LLL, BRUSHING: -  Malignant cells present  COMMENT: This is a complex tumor with overlapping morphology and immunohistochemical patterns. Morphologically, areas suggestive of gland formation are present. Immunohistochemically, tumor cells show positivity for p40 and p63 along with focal CK5/6. Focal positivity for TTF-1 and Napsin A is also identified. (interpretation limited by the close admixture of benign and malignant cells). 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 However, an adenocarcinoma component or adenosquamous carcinoma cannot be completely excluded on a limited cell block specimen.  Additionally, while foci of CD56 positivity are also noted, the absence of synaptophysin and chromogranin staining makes neuroendocrine differentiation less likely. The final classification is deferred to the resection specimen.  This case was reviewed with Drs. 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  PET scan 05/10/2024 Cavitary lesion seen in the superior segment of the left lower lobe abutting the interlobar fissure and has spiculations extending out to the pleura is hypermetabolic and worrisome for neoplasm. Maximum SUV value 9.5 and dimension 2.8 x 2.8 cm.   Isolated left hypermetabolic prominent hilar lymph node worrisome for regional spread of disease.   No additional areas of abnormal radiotracer uptake.   Semi-solid ground-glass like nodular area in the anterior right upper lobe is without abnormal uptake and appears similar going back to a CT scan of 2022. Recommend continued surveillance.   Scattered colonic diverticula.  Right-sided chest wall lipoma.   Coronary artery calcifications. Please correlate for other coronary risk factors.  03/25/2024 Myocardial perfusion CT Chest Lungs/Pleura: 2.9 x 2.3 cm slightly spiculated mass is seen  in superior segment of left lower lobe which appears to  extend to adjacent major fissure consistent with malignancy. PET scan is recommended.       Latest Ref Rng & Units 05/06/2024    6:02 AM 01/30/2021   12:25 PM 05/18/2013   10:55 AM  CBC  WBC 4.0 - 10.5 K/uL 10.2  6.0  8.7   Hemoglobin 13.0 - 17.0 g/dL 84.7  84.2  84.3   Hematocrit 39.0 - 52.0 % 43.2  44.0  43.3   Platelets 150 - 400 K/uL 278  246  270        Latest Ref Rng & Units 05/06/2024    6:02 AM 01/18/2022    2:39 PM 01/30/2021   12:25 PM  BMP  Glucose 70 - 99 mg/dL 879  847  875   BUN 8 - 23 mg/dL 14  20  24    Creatinine 0.61 - 1.24 mg/dL 9.15  8.96  8.77   Sodium 135 - 145 mmol/L 138  138  139   Potassium 3.5 - 5.1 mmol/L 3.7  3.5  3.3   Chloride 98 - 111 mmol/L 100  101  102   CO2 22 - 32 mmol/L 26  27  24    Calcium  8.9 - 10.3 mg/dL 9.2  8.9  9.2     BNP No results found for: BNP  ProBNP No results found for: PROBNP  PFT No results found for: FEV1PRE, FEV1POST, FVCPRE, FVCPOST, TLC, DLCOUNC, PREFEV1FVCRT, PSTFEV1FVCRT  CT SUPER D CHEST WO CONTRAST Result Date: 05/10/2024 CLINICAL DATA:  Left lower lobe mass/malignancy * Tracking Code: BO * EXAM: CT CHEST WITHOUT CONTRAST TECHNIQUE: Multidetector CT imaging of the chest was performed using thin slice collimation for electromagnetic bronchoscopy planning purposes, without intravenous contrast. RADIATION DOSE REDUCTION: This exam was performed according to the departmental dose-optimization program which includes automated exposure control, adjustment of the mA and/or kV according to patient size and/or use of iterative reconstruction technique. COMPARISON:  01/30/2021 FINDINGS: Cardiovascular: Coronary, aortic arch, and branch vessel atherosclerotic vascular disease. Mediastinum/Nodes: 1.1 cm left hilar lymph node, image 187 series 2. Lungs/Pleura: 3.0 by 2.8 cm superior segment left lower lobe mass, image 63 series 5. This mildly abuts the major fissure. Spiculated margins suspicious for malignancy. 2.9  by 1.8 cm subsolid nodule anteriorly in the right upper lobe on image 60 series 5 this was less dense on 01/30/2021 and measured approximately 2.5 by 1.5 cm. Cannot exclude low-grade adenocarcinoma. Emphysema noted. Airway thickening is present, suggesting bronchitis or reactive airways disease. Upper Abdomen: Unremarkable Musculoskeletal: Lipoma of the right lower serratus anterior muscle. Mild thoracic spondylosis. IMPRESSION: 1. 3.0 by 2.8 cm superior segment left lower lobe mass with spiculated margins suspicious for malignancy. 2. 2.9 by 1.8 cm subsolid nodule anteriorly in the right upper lobe, this was less dense on 01/30/2021 and measured approximately 2.5 by 1.5 cm. Cannot exclude low-grade adenocarcinoma. 3. 1.1 cm left hilar lymph node, suspicious for malignancy. 4. Airway thickening is present, suggesting bronchitis or reactive airways disease. 5. Lipoma of the right lower serratus anterior muscle. 6. Aortic Atherosclerosis (ICD10-I70.0) and Emphysema (ICD10-J43.9). Electronically Signed   By: Ryan Salvage M.D.   On: 05/10/2024 21:51   NM PET Image Initial (PI) Skull Base To Thigh (F-18 FDG) Result Date: 05/10/2024 CLINICAL DATA:  Initial treatment strategy for lung mass. EXAM: NUCLEAR MEDICINE PET SKULL BASE TO THIGH TECHNIQUE: 8.72 mCi F-18 FDG was injected intravenously. Full-ring PET imaging was performed from the skull base  to thigh after the radiotracer. CT data was obtained and used for attenuation correction and anatomic localization. Fasting blood glucose: 106 mg/dl COMPARISON:  CT chest 92/80/7974 and older FINDINGS: Mediastinal blood pool activity: SUV max 1.7 Liver activity: SUV max 2.3 NECK: No specific abnormal uptake identified in the neck including along lymph node change of the submandibular, posterior triangle or internal jugular region. Near symmetric uptake of the intracranial compartment. Incidental CT findings: The parotid glands, submandibular glands and thyroid gland are  grossly preserved. Visualized paranasal sinuses and mastoid air cells are clear. Streak artifact related to the patient's dental hardware. Scattered vascular calcifications. CHEST: There is a cavitary mass identified in the superior segment left lower lobe abutting the interlobar fissure which has maximum SUV value 9.5. Lesion on CT image 76 measures 2.8 by 2.8 cm. There is some adjacent ground-glass is caudal to this which has some minimal uptake. Spiculated does extend out to the pleura posteriorly as well. No additional areas of abnormal uptake along the lung parenchyma. There is also focal area of abnormal uptake seen along the left hilum maximum SUV value of 5.2. Diameter of the uptake measures 11 mm on PET image 82. Likely a metastatic regional node. Corresponding focus on CT. No additional areas of abnormal uptake above blood pool elsewhere in the axillary regions, hilum or mediastinum. Incidental CT findings: Heart is nonenlarged. Trace pericardial fluid. Coronary artery calcifications are seen. Please correlate for other coronary risk factors. The thoracic aorta is normal course and caliber with some mild calcified plaque. Normal caliber thoracic esophagus. No pleural effusion or pneumothorax. Breathing motion. There is a semi-solid ill-defined nodular at the right upper lung anteriorly measuring 2.7 by 2.0 cm. This does not show any abnormal uptake. This area has been present and appears relatively similar in appearance going back to a CT scan of 01/30/2021. Of note, the lesion left lung was not seen on that examination of 2022. ABDOMEN/PELVIS: No abnormal hypermetabolic activity within the liver, pancreas, adrenal glands, or spleen. No hypermetabolic lymph nodes in the abdomen or pelvis. Incidental CT findings: Fatty liver infiltration. Adrenal glands, pancreas, spleen are grossly preserved. Gallbladder is nondilated. Lower pole left-sided renal cyst identified. These appear simple. Photopenic on the PET  dataset. Duplicated bilateral renal collecting systems, congenital variant. No definite renal or ureteral stones. Normal caliber aorta and IVC with some scattered vascular calcifications. Preserved contour to the underdistended urinary bladder. Large bowel has a normal course and caliber with scattered colonic stool. Surgical changes along the bases cecum in the appendix is not seen today. Please correlate with history. Stomach and small bowel are nondilated. Small fat containing umbilical hernia. SKELETON: No specific abnormal uptake identified along the visualized osseous structures. Incidental CT findings: Scattered degenerative changes. Focal right sided lower chest wall lipoma. Benign features and no abnormal uptake. IMPRESSION: Cavitary lesion seen in the superior segment of the left lower lobe abutting the interlobar fissure and has spiculations extending out to the pleura is hypermetabolic and worrisome for neoplasm. Maximum SUV value 9.5 and dimension 2.8 x 2.8 cm. Isolated left hypermetabolic prominent hilar lymph node worrisome for regional spread of disease. No additional areas of abnormal radiotracer uptake. Semi-solid ground-glass like nodular area in the anterior right upper lobe is without abnormal uptake and appears similar going back to a CT scan of 2022. Recommend continued surveillance. Scattered colonic diverticula.  Right-sided chest wall lipoma. Coronary artery calcifications. Please correlate for other coronary risk factors. Electronically Signed   By: Ranell  Charlanne M.D.   On: 05/10/2024 17:27   DG Chest Port 1 View Result Date: 05/06/2024 CLINICAL DATA:  Status post bronchoscopy EXAM: PORTABLE CHEST 1 VIEW COMPARISON:  Chest radiograph dated 01/30/2021 FINDINGS: Normal lung volumes. Rounded opacity projecting over the left hilum, likely corresponding to known left lower lobe mass. No pleural effusion or pneumothorax. The heart size and mediastinal contours are within normal limits. No  acute osseous abnormality. IMPRESSION: 1. No pneumothorax. 2. Rounded opacity projecting over the left hilum, likely corresponding to known left lower lobe mass. Electronically Signed   By: Limin  Xu M.D.   On: 05/06/2024 09:03   DG C-ARM BRONCHOSCOPY Result Date: 05/06/2024 C-ARM BRONCHOSCOPY: Fluoroscopy was utilized by the requesting physician.  No radiographic interpretation.     Past medical hx Past Medical History:  Diagnosis Date   Anxiety    pt denies   CAD (coronary artery disease)    Diabetes mellitus    Hepatic steatosis    Hypercholesteremia    Hypertension    Myocardial infarction (HCC) 10/18/2003     Social History   Tobacco Use   Smoking status: Every Day    Current packs/day: 1.50    Average packs/day: 1.5 packs/day for 35.0 years (52.5 ttl pk-yrs)    Types: Cigarettes   Smokeless tobacco: Current    Types: Snuff   Tobacco comments:    1 pack a day 04/29/2024 KRD  Vaping Use   Vaping status: Never Used  Substance Use Topics   Alcohol use: Yes    Comment: occasionally, maybe once a month   Drug use: No    Mr.Vallance reports that he has been smoking cigarettes. He has a 52.5 pack-year smoking history. His smokeless tobacco use includes snuff. He reports current alcohol use. He reports that he does not use drugs.  Tobacco Cessation: Ready to quit: Not Answered Counseling given: Not Answered Tobacco comments: 1 pack a day 04/29/2024 KRD Current every day smoker , I spent 3-4 minutes counseling patient on  steps to stop use of tobacco products. I have provided patient with information on receiving free nicotine replacement therapy, and contact numbers for hypnosis for smoking cessation as well as acupuncture for smoking cessation.   Past surgical hx, Family hx, Social hx all reviewed.  Current Outpatient Medications on File Prior to Visit  Medication Sig   acetaminophen  (TYLENOL ) 500 MG tablet Take 1,000 mg by mouth as needed. For pain   aspirin EC 81 MG  tablet Take 81 mg by mouth at bedtime.   augmented betamethasone dipropionate (DIPROLENE-AF) 0.05 % cream Apply 1 application. topically 2 (two) times daily as needed (skin itching/irritation.).   clopidogrel  (PLAVIX ) 75 MG tablet TAKE 1 TABLET BY MOUTH EVERY DAY (Patient taking differently: Take 75 mg by mouth at bedtime.)   hydrochlorothiazide (HYDRODIURIL) 25 MG tablet Take 25 mg by mouth in the morning.   metFORMIN (GLUCOPHAGE) 1000 MG tablet Take 1,000 mg by mouth 2 (two) times daily.   metoprolol (LOPRESSOR) 50 MG tablet Take 50 mg by mouth 2 (two) times daily.    nitroGLYCERIN  (NITROSTAT ) 0.4 MG SL tablet Place 1 tablet (0.4 mg total) under the tongue every 5 (five) minutes as needed for chest pain. Max 3 dose   olmesartan  (BENICAR ) 40 MG tablet Take 1 tablet (40 mg total) by mouth in the morning.   rosuvastatin  (CRESTOR ) 10 MG tablet Take 1 tablet (10 mg total) by mouth daily.   No current facility-administered medications on file prior to visit.  No Known Allergies  Review Of Systems:  Constitutional:   No  weight loss, night sweats,  Fevers, chills, fatigue, or  lassitude.  HEENT:   No headaches,  Difficulty swallowing,  Tooth/dental problems, or  Sore throat,                No sneezing, itching, ear ache, nasal congestion, post nasal drip,   CV:  No chest pain,  Orthopnea, PND, swelling in lower extremities, anasarca, dizziness, palpitations, syncope.   GI  No heartburn, indigestion, abdominal pain, nausea, vomiting, diarrhea, change in bowel habits, loss of appetite, bloody stools.   Resp: + shortness of breath with exertion less at rest.  + excess mucus, + productive cough,  No non-productive cough,  No coughing up of blood.  No change in color of mucus.  No wheezing.  No chest wall deformity  Skin: no rash or lesions.  GU: no dysuria, change in color of urine, no urgency or frequency.  No flank pain, no hematuria   MS:  No joint pain or swelling.  No decreased range  of motion.  No back pain.  Psych:  No change in mood or affect. No depression or anxiety.  No memory loss.   Vital Signs BP 126/64 (BP Location: Left Arm, Patient Position: Sitting, Cuff Size: Normal)   Pulse 67   Temp 97.9 F (36.6 C) (Temporal)   Ht 5' 6 (1.676 m)   Wt 165 lb 6.4 oz (75 kg)   SpO2 98%   BMI 26.70 kg/m    Physical Exam:  General- No distress,  A&Ox3, pleasant and apprpriate ENT: No sinus tenderness, TM clear, pale nasal mucosa, no oral exudate,no post nasal drip, no LAN Cardiac: S1, S2, regular rate and rhythm, no murmur Chest: No wheeze/ rales/ dullness; no accessory muscle use, no nasal flaring, no sternal retractions, clear throughout Abd.: Soft Non-tender, ND, BS +, Body mass index is 26.7 kg/m.  Ext: No clubbing cyanosis, edema, no obvious deformities Neuro:  normal strength, MAE x 4, A&O x 3, appropriate Skin: No rashes, warm and dry, no obvious skin lesions  Psych: normal mood and behavior   Assessment/Plan New diagnosis non small cell lung cancer Sampling was a complex tumor with overlapping morphology suggesting both adeno carcinoma and squamous cell components. Current every day smoker  PET scan concerning for left hypermetabolic prominent hilar lymph node worrisome for regional spread of disease. Plan I am glad you have done well after your bronchoscopy with biopsies. We have reviewed the results of your biopsy and they were positive for a non-small cell lung cancer. I have referred you to medical oncology to be further evaluated for treatment options. We have ordered an MRI of the brain this will be done at [facility] to complete staging. I have ordered pulmonary function test to evaluate you for COPD. These will also help us  determine if you are surgical candidate should that be a portion of your treatment options. You will get phone calls to get the consults and the MRI brain, and pulmonary function tests scheduled. Good luck with  your therapy and treatment You will get great care at the lung cancer center Call if you need us  sooner Follow-up after your cancer treatment so that we can talk to you about possibly starting some maintenance inhalers for your COPD if pulmonary function tests determine you have COPD. Please work on quitting smoking completely. This is very important. Call us  if you need us  for anything. Please  contact office for sooner follow up if symptoms do not improve or worsen or seek emergency care   You can receive free nicotine replacement therapy (patches, gum, or mints) by calling 1-800-QUIT NOW. Please call so we can get you on the path to becoming a non-smoker. I know it is hard, but you can do this!  Hypnosis for smoking cessation  [employer] [phone]  Acupuncture for smoking cessation  [employer] [phone]    I spent 35 minutes dedicated to the care of this patient on the date of this encounter to include pre-visit review of records, face-to-face time with the patient discussing conditions above, post visit ordering of testing, clinical documentation with the electronic health record, making appropriate referrals as documented, and communicating necessary information to the patient's healthcare team.   Lauraine JULIANNA Lites, NP 05/13/2024  2:32 PM

## 2024-05-14 NOTE — Progress Notes (Signed)
 I agree with the plans as outlined above.   Lamar Chris, MD, PhD 05/14/2024, 3:22 PM [facility] [phone] or if no answer before 7:00PM call [phone] For any issues after 7:00PM please call eLink [phone]

## 2024-05-15 ENCOUNTER — Ambulatory Visit: Admitting: Acute Care

## 2024-05-15 NOTE — Progress Notes (Addendum)
 Location of tumor and Histology per Pathology Report:  Left  lower lobe  Biopsy:   Past/Anticipated interventions by surgeon, if any: left    VIDEO BRONCHOSCOPY WITH ENDOBRONCHIAL NAVIGATION (Left)   Past/Anticipated interventions by medical oncology, if any: Patient to see Dr. Gatha    Past/Anticipated interventions by Pulmonologist:    Pain issues, if any:  no   Cough: Yes, productive SOB: No   SAFETY ISSUES: Prior radiation? no Pacemaker/ICD? no Possible current pregnancy? no Is the patient on methotrexate? no  Current Complaints / other details:       BP 108/74 (BP Location: Left Arm, Patient Position: Sitting, Cuff Size: Normal)   Pulse 62   Temp (!) 97.5 F (36.4 C)   Resp 18   Ht 5' 6 (1.676 m)   Wt 163 lb 9.6 oz (74.2 kg)   SpO2 98%   BMI 26.41 kg/m

## 2024-05-16 ENCOUNTER — Other Ambulatory Visit: Payer: Self-pay

## 2024-05-16 NOTE — Progress Notes (Signed)
 The proposed treatment discussed in conference is for discussion purpose only and is not a binding recommendation.  The patients have not been physically examined, or presented with their treatment options.  Therefore, final treatment plans cannot be decided.

## 2024-05-18 ENCOUNTER — Ambulatory Visit (HOSPITAL_COMMUNITY)
Admission: RE | Admit: 2024-05-18 | Discharge: 2024-05-18 | Disposition: A | Discharge status: Home / Self Care | Source: Ambulatory Visit | Attending: Acute Care | Admitting: Acute Care

## 2024-05-18 DIAGNOSIS — C3490 Malignant neoplasm of unspecified part of unspecified bronchus or lung: Secondary | ICD-10-CM | POA: Diagnosis present

## 2024-05-18 MED ORDER — GADOBUTROL 1 MMOL/ML IV SOLN
7.0000 mL | Freq: Once | INTRAVENOUS | Status: AC | PRN
  Administered 2024-05-18: 7 mL via INTRAVENOUS

## 2024-05-18 NOTE — Progress Notes (Signed)
 Radiation Oncology         [phone] ________________________________  Initial Outpatient Consultation  Name: Nathan [name] MRN: [id number]  Date: 05/20/2024  DOB: [birth year]  RR:Hnoipwh, Norleen, MD  Nathan Lamar RAMAN, MD   REFERRING PHYSICIAN: [name], MD  DIAGNOSIS: There were no encounter diagnoses.  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 male with a significant smoking history and a medical history notable of CAD. Today, he is accompanied by ***. he is seen as a courtesy of Dr. Byrum for an opinion concerning radiation therapy as part of management for his recently diagnosed left lung cancer.   The patient presented for a routine cardiac perfusion CT in the setting of CAD on 03/25/24 which incidentally revealed a 2.9 x 2.3 cm mass in the superior segment of left lower lobe concerning for malignancy.   He was subsequently referred to [facility] and met with Dr. Shelah who recommended proceeding with further work-up (imaging results and dates detailed as follows):  -- Super-D chest CT on 05/04/24 demonstrated: the 3.0 cm superior segment LLL mass with spiculated margins concerning for malignancy; a previously demonstrated 2.9 cm sub-solid nodule in the anterior RUL that previously measured 2.5 cm on a CTA performed in 2022, potentially concerning for a low-grade adenocarcinoma; a 1.1 cm left hilar lymph node concerning for malignancy; and airway thickening suggestive of bronchitis or reactive airway disease. Other findings of potential clinical significance included a lipoma in the right lower serratus anterior muscle, and findings consisting with aortic atherosclerosis and emphysema.  -- PET scan on 05/10/24 demonstrated: hypermetabolism associated with the  2.8 cm LLL mass abutting the interlobar fissure (also with spiculations extending out to the pleura); and an isolated left hypermetabolic prominent hilar lymph node worrisome for metastatic disease. The semi-solid ground glass nodular area in the anterior RUL was also redemonstrated and appeared stable and without abnormal uptake. No other findings concerning for malignancy were demonstrated otherwise.   Based on Dr. Lanny recommendation, he opted to proceed with a bronchoscopy w/ biopsies of the LLL mass on 05/06/24. Cytology showed findings consistent with non-small cell carcinoma from FNA of the LLL, and malignant cells present from brushing of the LLL. Per further analysis, histology favors an overlapping morphology consisting of both adenocarcinoma and squamous cell components.  Pertinent imaging performed thus far includes an MRI of the brain on 08/02 to complete his staging work-up. Results are pending at this time.   Dr. Shelah has referred him to medical oncology and he is scheduled to meet with Dr. Sherrod in consultation on 08/05. PFT has also been ordered to help determinate his surgical candidacy.    PREVIOUS RADIATION THERAPY: No  PAST MEDICAL HISTORY:  Past Medical History:  Diagnosis Date   Anxiety    pt denies   CAD (coronary artery disease)    Diabetes mellitus    Hepatic steatosis    Hypercholesteremia    Hypertension    Myocardial infarction (HCC) 10/18/2003    PAST SURGICAL HISTORY: Past Surgical History:  Procedure Laterality Date   APPENDECTOMY     CARDIAC CATHETERIZATION  01/25/2004   Cypher DES (3.5x21mm) for PPMI to RCA (Dr. IVAR Sor)   CARDIAC CATHETERIZATION  01/03/2005   patent stent with noncritical 30% Cfx disease and 30% LAD disease (  Dr. DOROTHA Schwalbe)   COLONOSCOPY  09/06/2011   Procedure: COLONOSCOPY;  Surgeon: Oneil DELENA Budge;  Location: [facility];  Service: Gastroenterology;  Laterality: N/A;   COLONOSCOPY WITH PROPOFOL  N/A 01/20/2022   Procedure:  COLONOSCOPY WITH PROPOFOL ;  Surgeon: Shaaron Lamar HERO, MD;  Location: [facility];  Service: Endoscopy;  Laterality: N/A;  9:15am   NM MYOCAR PERF WALL MOTION  11/2010   Nathan [name] ; normal pattern of perfusion in all regions, post-stress EF 62%; normal, low risk scan with improved perfusion from previous study   TRANSTHORACIC ECHOCARDIOGRAM  12/2011   EF=>55%, mild conc LVH; LA mildly dilated; trace MR & TR with normal RSVP; trace pulm valve regurg   VIDEO BRONCHOSCOPY WITH ENDOBRONCHIAL NAVIGATION Left 05/06/2024   Procedure: VIDEO BRONCHOSCOPY WITH ENDOBRONCHIAL NAVIGATION;  Surgeon: Nathan Lamar RAMAN, MD;  Location: [facility];  Service: Pulmonary;  Laterality: Left;    FAMILY HISTORY:  Family History  Problem Relation Age of Onset   Cancer Mother    Heart disease Mother    Heart disease Maternal Grandmother    Diabetes Paternal Grandmother    Sudden death Paternal Grandmother    Colon cancer Neg Hx     SOCIAL HISTORY:  Social History   Tobacco Use   Smoking status: Every Day    Current packs/day: 1.50    Average packs/day: 1.5 packs/day for 35.0 years (52.5 ttl pk-yrs)    Types: Cigarettes   Smokeless tobacco: Current    Types: Snuff   Tobacco comments:    1 pack a day 04/29/2024 KRD  Vaping Use   Vaping status: Never Used  Substance Use Topics   Alcohol use: Yes    Comment: occasionally, maybe once a month   Drug use: No    ALLERGIES: No Known Allergies  MEDICATIONS:  Current Outpatient Medications  Medication Sig Dispense Refill   acetaminophen  (TYLENOL ) 500 MG tablet Take 1,000 mg by mouth as needed. For pain     aspirin EC 81 MG tablet Take 81 mg by mouth at bedtime.     augmented betamethasone dipropionate (DIPROLENE-AF) 0.05 % cream Apply 1 application. topically 2 (two) times daily as needed (skin itching/irritation.).     clopidogrel  (PLAVIX ) 75 MG tablet TAKE 1 TABLET BY MOUTH EVERY DAY (Patient taking differently: Take 75 mg by mouth at bedtime.) 90  tablet 3   doxycycline  (VIBRA -TABS) 100 MG tablet Take 1 tablet (100 mg total) by mouth 2 (two) times daily. 14 tablet 0   hydrochlorothiazide (HYDRODIURIL) 25 MG tablet Take 25 mg by mouth in the morning.     metFORMIN (GLUCOPHAGE) 1000 MG tablet Take 1,000 mg by mouth 2 (two) times daily.     metoprolol (LOPRESSOR) 50 MG tablet Take 50 mg by mouth 2 (two) times daily.      nitroGLYCERIN  (NITROSTAT ) 0.4 MG SL tablet Place 1 tablet (0.4 mg total) under the tongue every 5 (five) minutes as needed for chest pain. Max 3 dose 25 tablet 2   olmesartan  (BENICAR ) 40 MG tablet Take 1 tablet (40 mg total) by mouth in the morning. 90 tablet 3   rosuvastatin  (CRESTOR ) 10 MG tablet Take 1 tablet (10 mg total) by mouth daily. 90 tablet 3   No current facility-administered medications for this encounter.    REVIEW OF SYSTEMS:  A 10+ POINT REVIEW OF SYSTEMS WAS OBTAINED including neurology, dermatology, psychiatry, cardiac, respiratory, lymph, extremities, GI, GU, musculoskeletal, constitutional, reproductive, HEENT. ***   PHYSICAL EXAM:  vitals were not  taken for this visit.   General: Alert and oriented, in no acute distress HEENT: Head is normocephalic. Extraocular movements are intact. Oropharynx is clear. Neck: Neck is supple, no palpable cervical or supraclavicular lymphadenopathy. Heart: Regular in rate and rhythm with no murmurs, rubs, or gallops. Chest: Clear to auscultation bilaterally, with no rhonchi, wheezes, or rales. Abdomen: Soft, nontender, nondistended, with no rigidity or guarding. Extremities: No cyanosis or edema. Lymphatics: see Neck Exam Skin: No concerning lesions. Musculoskeletal: symmetric strength and muscle tone throughout. Neurologic: Cranial nerves II through XII are grossly intact. No obvious focalities. Speech is fluent. Coordination is intact. Psychiatric: Judgment and insight are intact. Affect is appropriate. ***  ECOG = ***  0 - Asymptomatic (Fully active, able to  carry on all predisease activities without restriction)  1 - Symptomatic but completely ambulatory (Restricted in physically strenuous activity but ambulatory and able to carry out work of a light or sedentary nature. For example, light housework, office work)  2 - Symptomatic, <50% in bed during the day (Ambulatory and capable of all self care but unable to carry out any work activities. Up and about more than 50% of waking hours)  3 - Symptomatic, >50% in bed, but not bedbound (Capable of only limited self-care, confined to bed or chair 50% or more of waking hours)  4 - Bedbound (Completely disabled. Cannot carry on any self-care. Totally confined to bed or chair)  5 - Death   Raylene MM, Creech RH, Tormey DC, et al. [phone]). Toxicity and response criteria of the [facility]. Am. DOROTHA Bridges. Oncol. 5 (6): 649-55  LABORATORY DATA:  Lab Results  Component Value Date   WBC 10.2 05/06/2024   HGB 15.2 05/06/2024   HCT 43.2 05/06/2024   MCV 89.4 05/06/2024   PLT 278 05/06/2024   NEUTROABS 5.3 05/18/2013   Lab Results  Component Value Date   NA 138 05/06/2024   K 3.7 05/06/2024   CL 100 05/06/2024   CO2 26 05/06/2024   GLUCOSE 120 (H) 05/06/2024   BUN 14 05/06/2024   CREATININE 0.84 05/06/2024   CALCIUM  9.2 05/06/2024      RADIOGRAPHY: CT SUPER D CHEST WO CONTRAST Result Date: 05/10/2024 CLINICAL DATA:  Left lower lobe mass/malignancy * Tracking Code: BO * EXAM: CT CHEST WITHOUT CONTRAST TECHNIQUE: Multidetector CT imaging of the chest was performed using thin slice collimation for electromagnetic bronchoscopy planning purposes, without intravenous contrast. RADIATION DOSE REDUCTION: This exam was performed according to the departmental dose-optimization program which includes automated exposure control, adjustment of the mA and/or kV according to patient size and/or use of iterative reconstruction technique. COMPARISON:  01/30/2021 FINDINGS: Cardiovascular:  Coronary, aortic arch, and branch vessel atherosclerotic vascular disease. Mediastinum/Nodes: 1.1 cm left hilar lymph node, image 187 series 2. Lungs/Pleura: 3.0 by 2.8 cm superior segment left lower lobe mass, image 63 series 5. This mildly abuts the major fissure. Spiculated margins suspicious for malignancy. 2.9 by 1.8 cm subsolid nodule anteriorly in the right upper lobe on image 60 series 5 this was less dense on 01/30/2021 and measured approximately 2.5 by 1.5 cm. Cannot exclude low-grade adenocarcinoma. Emphysema noted. Airway thickening is present, suggesting bronchitis or reactive airways disease. Upper Abdomen: Unremarkable Musculoskeletal: Lipoma of the right lower serratus anterior muscle. Mild thoracic spondylosis. IMPRESSION: 1. 3.0 by 2.8 cm superior segment left lower lobe mass with spiculated margins suspicious for malignancy. 2. 2.9 by 1.8 cm subsolid nodule anteriorly in the right upper lobe, this was less dense  on 01/30/2021 and measured approximately 2.5 by 1.5 cm. Cannot exclude low-grade adenocarcinoma. 3. 1.1 cm left hilar lymph node, suspicious for malignancy. 4. Airway thickening is present, suggesting bronchitis or reactive airways disease. 5. Lipoma of the right lower serratus anterior muscle. 6. Aortic Atherosclerosis (ICD10-I70.0) and Emphysema (ICD10-J43.9). Electronically Signed   By: Ryan Salvage M.D.   On: 05/10/2024 21:51   NM PET Image Initial (PI) Skull Base To Thigh (F-18 FDG) Result Date: 05/10/2024 CLINICAL DATA:  Initial treatment strategy for lung mass. EXAM: NUCLEAR MEDICINE PET SKULL BASE TO THIGH TECHNIQUE: 8.72 mCi F-18 FDG was injected intravenously. Full-ring PET imaging was performed from the skull base to thigh after the radiotracer. CT data was obtained and used for attenuation correction and anatomic localization. Fasting blood glucose: 106 mg/dl COMPARISON:  CT chest 92/80/7974 and older FINDINGS: Mediastinal blood pool activity: SUV max 1.7 Liver activity:  SUV max 2.3 NECK: No specific abnormal uptake identified in the neck including along lymph node change of the submandibular, posterior triangle or internal jugular region. Near symmetric uptake of the intracranial compartment. Incidental CT findings: The parotid glands, submandibular glands and thyroid gland are grossly preserved. Visualized paranasal sinuses and mastoid air cells are clear. Streak artifact related to the patient's dental hardware. Scattered vascular calcifications. CHEST: There is a cavitary mass identified in the superior segment left lower lobe abutting the interlobar fissure which has maximum SUV value 9.5. Lesion on CT image 76 measures 2.8 by 2.8 cm. There is some adjacent ground-glass is caudal to this which has some minimal uptake. Spiculated does extend out to the pleura posteriorly as well. No additional areas of abnormal uptake along the lung parenchyma. There is also focal area of abnormal uptake seen along the left hilum maximum SUV value of 5.2. Diameter of the uptake measures 11 mm on PET image 82. Likely a metastatic regional node. Corresponding focus on CT. No additional areas of abnormal uptake above blood pool elsewhere in the axillary regions, hilum or mediastinum. Incidental CT findings: Heart is nonenlarged. Trace pericardial fluid. Coronary artery calcifications are seen. Please correlate for other coronary risk factors. The thoracic aorta is normal course and caliber with some mild calcified plaque. Normal caliber thoracic esophagus. No pleural effusion or pneumothorax. Breathing motion. There is a semi-solid ill-defined nodular at the right upper lung anteriorly measuring 2.7 by 2.0 cm. This does not show any abnormal uptake. This area has been present and appears relatively similar in appearance going back to a CT scan of 01/30/2021. Of note, the lesion left lung was not seen on that examination of 2022. ABDOMEN/PELVIS: No abnormal hypermetabolic activity within the liver,  pancreas, adrenal glands, or spleen. No hypermetabolic lymph nodes in the abdomen or pelvis. Incidental CT findings: Fatty liver infiltration. Adrenal glands, pancreas, spleen are grossly preserved. Gallbladder is nondilated. Lower pole left-sided renal cyst identified. These appear simple. Photopenic on the PET dataset. Duplicated bilateral renal collecting systems, congenital variant. No definite renal or ureteral stones. Normal caliber aorta and IVC with some scattered vascular calcifications. Preserved contour to the underdistended urinary bladder. Large bowel has a normal course and caliber with scattered colonic stool. Surgical changes along the bases cecum in the appendix is not seen today. Please correlate with history. Stomach and small bowel are nondilated. Small fat containing umbilical hernia. SKELETON: No specific abnormal uptake identified along the visualized osseous structures. Incidental CT findings: Scattered degenerative changes. Focal right sided lower chest wall lipoma. Benign features and no abnormal uptake. IMPRESSION:  Cavitary lesion seen in the superior segment of the left lower lobe abutting the interlobar fissure and has spiculations extending out to the pleura is hypermetabolic and worrisome for neoplasm. Maximum SUV value 9.5 and dimension 2.8 x 2.8 cm. Isolated left hypermetabolic prominent hilar lymph node worrisome for regional spread of disease. No additional areas of abnormal radiotracer uptake. Semi-solid ground-glass like nodular area in the anterior right upper lobe is without abnormal uptake and appears similar going back to a CT scan of 2022. Recommend continued surveillance. Scattered colonic diverticula.  Right-sided chest wall lipoma. Coronary artery calcifications. Please correlate for other coronary risk factors. Electronically Signed   By: Ranell Bring M.D.   On: 05/10/2024 17:27   DG Chest Port 1 View Result Date: 05/06/2024 CLINICAL DATA:  Status post bronchoscopy  EXAM: PORTABLE CHEST 1 VIEW COMPARISON:  Chest radiograph dated 01/30/2021 FINDINGS: Normal lung volumes. Rounded opacity projecting over the left hilum, likely corresponding to known left lower lobe mass. No pleural effusion or pneumothorax. The heart size and mediastinal contours are within normal limits. No acute osseous abnormality. IMPRESSION: 1. No pneumothorax. 2. Rounded opacity projecting over the left hilum, likely corresponding to known left lower lobe mass. Electronically Signed   By: Limin  Xu M.D.   On: 05/06/2024 09:03   DG C-ARM BRONCHOSCOPY Result Date: 05/06/2024 C-ARM BRONCHOSCOPY: Fluoroscopy was utilized by the requesting physician.  No radiographic interpretation.      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  We discussed the natural history of *** and general treatment, highlighting the role of radiotherapy in the management.  We discussed the available radiation techniques, and focused on the details of logistics and delivery.  We reviewed the anticipated acute and late sequelae associated with radiation in this setting.  The patient was encouraged to ask questions that I answered to the best of my ability. *** A patient consent form was discussed and signed.  We retained a copy for our records.  The patient would like to proceed with radiation and will be scheduled for CT simulation.  PLAN: ***    *** minutes of total time was spent for this patient encounter, including preparation, face-to-face counseling with the patient and coordination of care, physical exam, and documentation of the encounter.   ------------------------------------------------  Lynwood CHARM Nasuti, PhD, MD  This document serves as a record of  services personally performed by Lynwood Nasuti, MD. It was created on his behalf by Dorthy Fuse, a trained medical scribe. The creation of this record is based on the scribe's personal observations and the provider's statements to them. This document has been checked and approved by the attending provider.

## 2024-05-20 ENCOUNTER — Telehealth: Payer: Self-pay | Admitting: Radiation Oncology

## 2024-05-20 ENCOUNTER — Ambulatory Visit
Admission: RE | Admit: 2024-05-20 | Discharge: 2024-05-20 | Disposition: A | Discharge status: Home / Self Care | Source: Ambulatory Visit | Attending: Radiation Oncology | Admitting: Radiation Oncology

## 2024-05-20 ENCOUNTER — Other Ambulatory Visit: Payer: Self-pay | Admitting: Medical Oncology

## 2024-05-20 ENCOUNTER — Encounter: Payer: Self-pay | Admitting: Radiation Oncology

## 2024-05-20 ENCOUNTER — Telehealth: Payer: Self-pay | Admitting: Acute Care

## 2024-05-20 VITALS — BP 108/74 | HR 62 | Temp 97.5°F | Resp 18 | Ht 66.0 in | Wt 163.6 lb

## 2024-05-20 DIAGNOSIS — Z7982 Long term (current) use of aspirin: Secondary | ICD-10-CM | POA: Insufficient documentation

## 2024-05-20 DIAGNOSIS — F17210 Nicotine dependence, cigarettes, uncomplicated: Secondary | ICD-10-CM | POA: Diagnosis not present

## 2024-05-20 DIAGNOSIS — I700 Atherosclerosis of aorta: Secondary | ICD-10-CM | POA: Diagnosis not present

## 2024-05-20 DIAGNOSIS — D171 Benign lipomatous neoplasm of skin and subcutaneous tissue of trunk: Secondary | ICD-10-CM | POA: Diagnosis not present

## 2024-05-20 DIAGNOSIS — Z7984 Long term (current) use of oral hypoglycemic drugs: Secondary | ICD-10-CM | POA: Diagnosis not present

## 2024-05-20 DIAGNOSIS — E119 Type 2 diabetes mellitus without complications: Secondary | ICD-10-CM | POA: Insufficient documentation

## 2024-05-20 DIAGNOSIS — I10 Essential (primary) hypertension: Secondary | ICD-10-CM | POA: Insufficient documentation

## 2024-05-20 DIAGNOSIS — Z809 Family history of malignant neoplasm, unspecified: Secondary | ICD-10-CM | POA: Insufficient documentation

## 2024-05-20 DIAGNOSIS — I252 Old myocardial infarction: Secondary | ICD-10-CM | POA: Diagnosis not present

## 2024-05-20 DIAGNOSIS — K5730 Diverticulosis of large intestine without perforation or abscess without bleeding: Secondary | ICD-10-CM | POA: Diagnosis not present

## 2024-05-20 DIAGNOSIS — E7800 Pure hypercholesterolemia, unspecified: Secondary | ICD-10-CM | POA: Diagnosis not present

## 2024-05-20 DIAGNOSIS — R918 Other nonspecific abnormal finding of lung field: Secondary | ICD-10-CM

## 2024-05-20 DIAGNOSIS — K429 Umbilical hernia without obstruction or gangrene: Secondary | ICD-10-CM | POA: Diagnosis not present

## 2024-05-20 DIAGNOSIS — M47814 Spondylosis without myelopathy or radiculopathy, thoracic region: Secondary | ICD-10-CM | POA: Diagnosis not present

## 2024-05-20 DIAGNOSIS — I2510 Atherosclerotic heart disease of native coronary artery without angina pectoris: Secondary | ICD-10-CM | POA: Insufficient documentation

## 2024-05-20 DIAGNOSIS — K760 Fatty (change of) liver, not elsewhere classified: Secondary | ICD-10-CM | POA: Diagnosis not present

## 2024-05-20 DIAGNOSIS — Z79899 Other long term (current) drug therapy: Secondary | ICD-10-CM | POA: Diagnosis not present

## 2024-05-20 DIAGNOSIS — J439 Emphysema, unspecified: Secondary | ICD-10-CM | POA: Diagnosis not present

## 2024-05-20 DIAGNOSIS — C3432 Malignant neoplasm of lower lobe, left bronchus or lung: Secondary | ICD-10-CM

## 2024-05-20 NOTE — Telephone Encounter (Signed)
 Copied from CRM [id number]. Topic: Appointments - Scheduling Inquiry for Clinic >> May 20, 2024  9:42 AM Joesph PARAS wrote: Reason for CRM: Patient's daughter is calling in to request to schedule PFT and f/u immediately, as patient's oncologist wants it done ASAP to confirm patient is a candidate for surgery.  Currently, no solutions available for f/u with indicated provider without overruling. Please reach back out to patient to schedule. >> May 20, 2024  9:51 AM Celestine FALCON wrote: Nat is calling (daughter) for the pt, her phone number is [phone] regarding previous message Patient's daughter is calling in to request to schedule PFT and f/u immediately, as patient's oncologist wants it done ASAP to confirm patient is a candidate for surgery.  Currently, no solutions available for f/u with indicated provider without overruling. Please reach back out to patient to schedule.   Pt's phone number [phone] ok to leave a vm.   Left voicemail with [facility] to get patient scheduled for a PFT.

## 2024-05-20 NOTE — Telephone Encounter (Signed)
 8/4 Outgoing Referral faxed to Cardiothoracic Surgery - Dr. Chrystal office.

## 2024-05-21 ENCOUNTER — Inpatient Hospital Stay: Attending: Internal Medicine | Admitting: Internal Medicine

## 2024-05-21 ENCOUNTER — Inpatient Hospital Stay

## 2024-05-21 ENCOUNTER — Other Ambulatory Visit: Payer: Self-pay | Admitting: Adult Health

## 2024-05-21 VITALS — BP 99/68 | HR 63 | Temp 97.2°F | Resp 17 | Ht 66.0 in | Wt 160.0 lb

## 2024-05-21 DIAGNOSIS — I252 Old myocardial infarction: Secondary | ICD-10-CM | POA: Diagnosis not present

## 2024-05-21 DIAGNOSIS — C3432 Malignant neoplasm of lower lobe, left bronchus or lung: Secondary | ICD-10-CM | POA: Diagnosis present

## 2024-05-21 DIAGNOSIS — I2510 Atherosclerotic heart disease of native coronary artery without angina pectoris: Secondary | ICD-10-CM | POA: Diagnosis not present

## 2024-05-21 DIAGNOSIS — E119 Type 2 diabetes mellitus without complications: Secondary | ICD-10-CM | POA: Diagnosis not present

## 2024-05-21 DIAGNOSIS — I10 Essential (primary) hypertension: Secondary | ICD-10-CM | POA: Diagnosis not present

## 2024-05-21 DIAGNOSIS — K760 Fatty (change of) liver, not elsewhere classified: Secondary | ICD-10-CM | POA: Insufficient documentation

## 2024-05-21 DIAGNOSIS — D171 Benign lipomatous neoplasm of skin and subcutaneous tissue of trunk: Secondary | ICD-10-CM | POA: Insufficient documentation

## 2024-05-21 DIAGNOSIS — I700 Atherosclerosis of aorta: Secondary | ICD-10-CM | POA: Diagnosis not present

## 2024-05-21 DIAGNOSIS — I6782 Cerebral ischemia: Secondary | ICD-10-CM | POA: Insufficient documentation

## 2024-05-21 DIAGNOSIS — E785 Hyperlipidemia, unspecified: Secondary | ICD-10-CM | POA: Diagnosis not present

## 2024-05-21 DIAGNOSIS — F17210 Nicotine dependence, cigarettes, uncomplicated: Secondary | ICD-10-CM | POA: Insufficient documentation

## 2024-05-21 DIAGNOSIS — Z807 Family history of other malignant neoplasms of lymphoid, hematopoietic and related tissues: Secondary | ICD-10-CM | POA: Insufficient documentation

## 2024-05-21 DIAGNOSIS — K429 Umbilical hernia without obstruction or gangrene: Secondary | ICD-10-CM | POA: Diagnosis not present

## 2024-05-21 DIAGNOSIS — Z7984 Long term (current) use of oral hypoglycemic drugs: Secondary | ICD-10-CM | POA: Insufficient documentation

## 2024-05-21 DIAGNOSIS — E7800 Pure hypercholesterolemia, unspecified: Secondary | ICD-10-CM | POA: Insufficient documentation

## 2024-05-21 DIAGNOSIS — N281 Cyst of kidney, acquired: Secondary | ICD-10-CM | POA: Insufficient documentation

## 2024-05-21 DIAGNOSIS — Z79899 Other long term (current) drug therapy: Secondary | ICD-10-CM | POA: Diagnosis not present

## 2024-05-21 DIAGNOSIS — Z7982 Long term (current) use of aspirin: Secondary | ICD-10-CM | POA: Insufficient documentation

## 2024-05-21 DIAGNOSIS — M47814 Spondylosis without myelopathy or radiculopathy, thoracic region: Secondary | ICD-10-CM | POA: Insufficient documentation

## 2024-05-21 DIAGNOSIS — R918 Other nonspecific abnormal finding of lung field: Secondary | ICD-10-CM

## 2024-05-21 DIAGNOSIS — Z806 Family history of leukemia: Secondary | ICD-10-CM | POA: Diagnosis not present

## 2024-05-21 DIAGNOSIS — J439 Emphysema, unspecified: Secondary | ICD-10-CM | POA: Insufficient documentation

## 2024-05-21 LAB — CBC WITH DIFFERENTIAL (CANCER CENTER ONLY)
Abs Immature Granulocytes: 0.04 K/uL (ref 0.00–0.07)
Basophils Absolute: 0.1 K/uL (ref 0.0–0.1)
Basophils Relative: 1 %
Eosinophils Absolute: 0.1 K/uL (ref 0.0–0.5)
Eosinophils Relative: 1 %
HCT: 45.3 % (ref 39.0–52.0)
Hemoglobin: 16.2 g/dL (ref 13.0–17.0)
Immature Granulocytes: 0 %
Lymphocytes Relative: 19 %
Lymphs Abs: 2.3 K/uL (ref 0.7–4.0)
MCH: 31.5 pg (ref 26.0–34.0)
MCHC: 35.8 g/dL (ref 30.0–36.0)
MCV: 88.1 fL (ref 80.0–100.0)
Monocytes Absolute: 0.7 K/uL (ref 0.1–1.0)
Monocytes Relative: 6 %
Neutro Abs: 8.8 K/uL — ABNORMAL HIGH (ref 1.7–7.7)
Neutrophils Relative %: 73 %
Platelet Count: 325 K/uL (ref 150–400)
RBC: 5.14 MIL/uL (ref 4.22–5.81)
RDW: 12.6 % (ref 11.5–15.5)
WBC Count: 12.1 K/uL — ABNORMAL HIGH (ref 4.0–10.5)
nRBC: 0 % (ref 0.0–0.2)

## 2024-05-21 LAB — CMP (CANCER CENTER ONLY)
ALT: 33 U/L (ref 0–44)
AST: 25 U/L (ref 15–41)
Albumin: 4.6 g/dL (ref 3.5–5.0)
Alkaline Phosphatase: 66 U/L (ref 38–126)
Anion gap: 6 (ref 5–15)
BUN: 15 mg/dL (ref 8–23)
CO2: 31 mmol/L (ref 22–32)
Calcium: 10.4 mg/dL — ABNORMAL HIGH (ref 8.9–10.3)
Chloride: 103 mmol/L (ref 98–111)
Creatinine: 0.93 mg/dL (ref 0.61–1.24)
GFR, Estimated: >60 mL/min (ref >60)
Glucose, Bld: 136 mg/dL — ABNORMAL HIGH (ref 70–99)
Potassium: 4.8 mmol/L (ref 3.5–5.1)
Sodium: 140 mmol/L (ref 135–145)
Total Bilirubin: 0.5 mg/dL (ref 0.0–1.2)
Total Protein: 7.5 g/dL (ref 6.5–8.1)

## 2024-05-21 NOTE — Progress Notes (Signed)
 [facility] [phone]   [phone]  CONSULT NOTE  REFERRING PHYSICIAN: Dr. Lamar Chris  REASON FOR CONSULTATION:  63 years old white male recently diagnosed with lung cancer  HPI Nathan [name] is a 63 y.o. male with past medical history significant for anxiety, coronary artery disease, hypertension, dyslipidemia, diabetes mellitus, hepatic steatosis as well as myocardial infarction.  On 03/25/2024 the patient underwent myocardial perfusion CT scan by his cardiologist.  Incidentally the scan showed 2.9 x 2.3 cm mass seen in the superior segment of the left lower lobe consistent with malignancy.  He was referred to Dr. Chris and had bronchoscopy on 05/06/2024 and the final pathology ([id number]) from the fine-needle aspiration of the left lower lobe as well as the brushing showed malignant cells consistent with non-small cell carcinoma. This is a complex tumor with overlapping morphology and  immunohistochemical patterns. Morphologically, areas suggestive of gland formation are present. Immunohistochemically, tumor cells show positivity for p40 and p63 along with focal CK5/6. Focal positivity for TTF-1 and Napsin A is also identified. (interpretation limited by the close admixture of benign and malignant cells). 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 However, an adenocarcinoma component or adenosquamous carcinoma cannot be completely excluded. The patient had a PET scan on 05/10/2024 and it showed the cavitary lesion seen in the superior segment of the left lower lobe abutting the anterior lobar fissure and has spiculated extending out to the pleura was hypermetabolic and worrisome for neoplasm with maximum SUV of 9.5 and dimension of 2.8 x 2.8 cm.  There was isolated left hypermetabolic prominent hilar lymph node worrisome for regional spread of disease.  No additional areas of abnormal radiotracer uptake.  He also has MRI  of the brain on 05/18/2024 that showed no evidence of intracranial metastatic disease.  The patient is here today for evaluation and recommendation regarding treatment of his condition.  Discussed the use of AI scribe software for clinical note transcription with the patient, who gave verbal consent to proceed.  History of Present Illness Nathan [name] is a 63 year old male with recently diagnosed lung cancer who presents for initial oncology consultation. He is accompanied by his daughter, Nat. He was referred by Dr. Chris for further evaluation and management of his lung cancer.  He was diagnosed with lung cancer following a routine stress test on March 25, 2024, which included a CT scan revealing a 2.9 by 2.3 cm nodule in the left lower lobe. Subsequent bronchoscopy confirmed non-small cell lung cancer, specifically squamous cell carcinoma. A PET scan on May 10, 2024, showed the mass in the left lower lobe and a lymph node in the hilar area. An MRI of the brain on May 18, 2024, did not show any abnormalities according to the patient.  He feels physically fine with no significant complaints, although he has been through significant emotional stress following the recent suicide of his wife, which occurred just days before his stress test. 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  He has a history of heart disease, having had a stent placed 20 years ago for a blockage. He also has diabetes, managed with metformin, and has been trying to improve his diet and lose weight to maintain a good A1c level. He reports some weight loss, which he attributes to dietary changes and recent stress.  He has been a smoker for approximately 47  years, starting at age 50, and has not quit smoking yet, although he has cut back. He reports occasional diarrhea, likely due to poor eating habits. No nausea, vomiting, headaches, or changes in vision. He  drinks alcohol occasionally and has not consumed any in the past month.  His family history is significant for cancer, with his mother having had lymphoma and his sister leukemia. His father possibly had COPD, although it was not formally diagnosed.     Past Medical History:  Diagnosis Date   Anxiety    pt denies   CAD (coronary artery disease)    Diabetes mellitus    Hepatic steatosis    Hypercholesteremia    Hypertension    Myocardial infarction [facility]) 10/18/2003      Past Surgical History:  Procedure Laterality Date   APPENDECTOMY     CARDIAC CATHETERIZATION  01/25/2004   Cypher DES (3.5x47mm) for PPMI to RCA (Dr. IVAR Sor)   CARDIAC CATHETERIZATION  01/03/2005   patent stent with noncritical 30% Cfx disease and 30% LAD disease (Dr. DOROTHA Schwalbe)   COLONOSCOPY  09/06/2011   Procedure: COLONOSCOPY;  Surgeon: Oneil DELENA Budge;  Location: [facility];  Service: Gastroenterology;  Laterality: N/A;   COLONOSCOPY WITH PROPOFOL  N/A 01/20/2022   Procedure: COLONOSCOPY WITH PROPOFOL ;  Surgeon: Shaaron Lamar HERO, MD;  Location: [facility];  Service: Endoscopy;  Laterality: N/A;  9:15am   NM MYOCAR PERF WALL MOTION  11/2010   bruce myoview ; normal pattern of perfusion in all regions, post-stress EF 62%; normal, low risk scan with improved perfusion from previous study   TRANSTHORACIC ECHOCARDIOGRAM  12/2011   EF=>55%, mild conc LVH; LA mildly dilated; trace MR & TR with normal RSVP; trace pulm valve regurg   VIDEO BRONCHOSCOPY WITH ENDOBRONCHIAL NAVIGATION Left 05/06/2024   Procedure: VIDEO BRONCHOSCOPY WITH ENDOBRONCHIAL NAVIGATION;  Surgeon: [name], MD;  Location: [facility];  Service: Pulmonary;  Laterality: Left;    Family History  Problem Relation Age of Onset   Cancer Mother    Heart disease Mother    Cancer Sister    Heart disease Maternal Grandmother    Diabetes Paternal Grandmother    Sudden death Paternal Grandmother    Colon cancer Neg Hx     Social History Social  History   Tobacco Use   Smoking status: Every Day    Current packs/day: 1.50    Average packs/day: 1.5 packs/day for 35.0 years (52.5 ttl pk-yrs)    Types: Cigarettes   Smokeless tobacco: Current    Types: Snuff   Tobacco comments:    1 pack a day 04/29/2024 KRD  Vaping Use   Vaping status: Never Used  Substance Use Topics   Alcohol use: Yes    Comment: occasionally, maybe once a month   Drug use: No    No Known Allergies  Current Outpatient Medications  Medication Sig Dispense Refill   acetaminophen  (TYLENOL ) 500 MG tablet Take 1,000 mg by mouth as needed. For pain     aspirin EC 81 MG tablet Take 81 mg by mouth at bedtime.     augmented betamethasone dipropionate (DIPROLENE-AF) 0.05 % cream Apply 1 application. topically 2 (two) times daily as needed (skin itching/irritation.).     clopidogrel  (PLAVIX ) 75 MG tablet TAKE 1 TABLET BY MOUTH EVERY DAY (Patient taking differently: Take 75 mg by mouth at bedtime.) 90 tablet 3   doxycycline  (VIBRA -TABS) 100 MG tablet Take 1 tablet (100 mg total) by mouth 2 (two) times  daily. 14 tablet 0   hydrochlorothiazide (HYDRODIURIL) 25 MG tablet Take 25 mg by mouth in the morning.     metFORMIN (GLUCOPHAGE) 1000 MG tablet Take 1,000 mg by mouth 2 (two) times daily.     metoprolol (LOPRESSOR) 50 MG tablet Take 50 mg by mouth 2 (two) times daily.      nitroGLYCERIN  (NITROSTAT ) 0.4 MG SL tablet Place 1 tablet (0.4 mg total) under the tongue every 5 (five) minutes as needed for chest pain. Max 3 dose 25 tablet 2   olmesartan  (BENICAR ) 40 MG tablet Take 1 tablet (40 mg total) by mouth in the morning. 90 tablet 3   rosuvastatin  (CRESTOR ) 10 MG tablet Take 1 tablet (10 mg total) by mouth daily. 90 tablet 3   No current facility-administered medications for this visit.    Review of Systems  Constitutional: positive for fatigue Eyes: negative Ears, nose, mouth, throat, and face: negative Respiratory: positive for cough Cardiovascular:  negative Gastrointestinal: negative Genitourinary:negative Integument/breast: negative Hematologic/lymphatic: negative Musculoskeletal:negative Neurological: negative Behavioral/Psych: negative Endocrine: negative Allergic/Immunologic: negative  Physical Exam  MJO:jozmu, healthy, no distress, well nourished, and well developed SKIN: skin color, texture, turgor are normal, no rashes or significant lesions HEAD: Normocephalic, No masses, lesions, tenderness or abnormalities EYES: normal, PERRLA, Conjunctiva are pink and non-injected EARS: External ears normal, Canals clear OROPHARYNX:no exudate, no erythema, and lips, buccal mucosa, and tongue normal  NECK: supple, no adenopathy, no JVD LYMPH:  no palpable lymphadenopathy, no hepatosplenomegaly LUNGS: clear to auscultation , and palpation HEART: regular rate & rhythm, no murmurs, and no gallops ABDOMEN:abdomen soft, non-tender, normal bowel sounds, and no masses or organomegaly BACK: Back symmetric, no curvature., No CVA tenderness EXTREMITIES:no joint deformities, effusion, or inflammation, no edema  NEURO: alert & oriented x 3 with fluent speech, no focal motor/sensory deficits  PERFORMANCE STATUS: ECOG 1  LABORATORY DATA: Lab Results  Component Value Date   WBC 10.2 05/06/2024   HGB 15.2 05/06/2024   HCT 43.2 05/06/2024   MCV 89.4 05/06/2024   PLT 278 05/06/2024      Chemistry      Component Value Date/Time   NA 138 05/06/2024 0602   K 3.7 05/06/2024 0602   CL 100 05/06/2024 0602   CO2 26 05/06/2024 0602   BUN 14 05/06/2024 0602   CREATININE 0.84 05/06/2024 0602   CREATININE 0.83 05/18/2013 1055      Component Value Date/Time   CALCIUM  9.2 05/06/2024 0602   ALKPHOS 55 01/30/2021 1225   AST 47 (H) 01/30/2021 1225   ALT 71 (H) 01/30/2021 1225   BILITOT 0.4 01/30/2021 1225       RADIOGRAPHIC STUDIES: MR BRAIN W WO CONTRAST Result Date: 05/18/2024 CLINICAL DATA:  Provided history: Malignant neoplasm of  unspecified part of unspecified bronchus or lung. Non-small cell lung cancer, staging. EXAM: MRI HEAD WITHOUT AND WITH CONTRAST TECHNIQUE: Multiplanar, multiecho pulse sequences of the brain and surrounding structures were obtained without and with intravenous contrast. CONTRAST:  7mL GADAVIST  GADOBUTROL  1 MMOL/ML IV SOLN COMPARISON:  PET CT 05/10/2024. FINDINGS: Brain: No age-advanced or lobar predominant cerebral atrophy. A few small foci of T2 FLAIR hyperintense signal abnormality scattered within the cerebral white matter are nonspecific, but most often secondary to chronic small vessel ischemia. Punctate chronic microhemorrhage within the anterior left temporal lobe. No cortical encephalomalacia is identified. There is no acute infarct. No evidence of an intracranial mass. No extra-axial fluid collection. No midline shift. No pathologic intracranial enhancement identified. Vascular: Maintained flow voids  within the proximal large arterial vessels. Skull and upper cervical spine: No focal worrisome marrow lesion. Sinuses/Orbits: No mass or acute finding within the imaged orbits. Trace mucosal thickening within the left frontal sinus. IMPRESSION: No evidence of intracranial metastatic disease. Electronically Signed   By: Rockey Childs D.O.   On: 05/18/2024 20:32   CT SUPER D CHEST WO CONTRAST Result Date: 05/10/2024 CLINICAL DATA:  Left lower lobe mass/malignancy * Tracking Code: BO * EXAM: CT CHEST WITHOUT CONTRAST TECHNIQUE: Multidetector CT imaging of the chest was performed using thin slice collimation for electromagnetic bronchoscopy planning purposes, without intravenous contrast. RADIATION DOSE REDUCTION: This exam was performed according to the departmental dose-optimization program which includes automated exposure control, adjustment of the mA and/or kV according to patient size and/or use of iterative reconstruction technique. COMPARISON:  01/30/2021 FINDINGS: Cardiovascular: Coronary, aortic arch,  and branch vessel atherosclerotic vascular disease. Mediastinum/Nodes: 1.1 cm left hilar lymph node, image 187 series 2. Lungs/Pleura: 3.0 by 2.8 cm superior segment left lower lobe mass, image 63 series 5. This mildly abuts the major fissure. Spiculated margins suspicious for malignancy. 2.9 by 1.8 cm subsolid nodule anteriorly in the right upper lobe on image 60 series 5 this was less dense on 01/30/2021 and measured approximately 2.5 by 1.5 cm. Cannot exclude low-grade adenocarcinoma. Emphysema noted. Airway thickening is present, suggesting bronchitis or reactive airways disease. Upper Abdomen: Unremarkable Musculoskeletal: Lipoma of the right lower serratus anterior muscle. Mild thoracic spondylosis. IMPRESSION: 1. 3.0 by 2.8 cm superior segment left lower lobe mass with spiculated margins suspicious for malignancy. 2. 2.9 by 1.8 cm subsolid nodule anteriorly in the right upper lobe, this was less dense on 01/30/2021 and measured approximately 2.5 by 1.5 cm. Cannot exclude low-grade adenocarcinoma. 3. 1.1 cm left hilar lymph node, suspicious for malignancy. 4. Airway thickening is present, suggesting bronchitis or reactive airways disease. 5. Lipoma of the right lower serratus anterior muscle. 6. Aortic Atherosclerosis (ICD10-I70.0) and Emphysema (ICD10-J43.9). Electronically Signed   By: Ryan Salvage M.D.   On: 05/10/2024 21:51   NM PET Image Initial (PI) Skull Base To Thigh (F-18 FDG) Result Date: 05/10/2024 CLINICAL DATA:  Initial treatment strategy for lung mass. EXAM: NUCLEAR MEDICINE PET SKULL BASE TO THIGH TECHNIQUE: 8.72 mCi F-18 FDG was injected intravenously. Full-ring PET imaging was performed from the skull base to thigh after the radiotracer. CT data was obtained and used for attenuation correction and anatomic localization. Fasting blood glucose: 106 mg/dl COMPARISON:  CT chest 92/80/7974 and older FINDINGS: Mediastinal blood pool activity: SUV max 1.7 Liver activity: SUV max 2.3 NECK: No  specific abnormal uptake identified in the neck including along lymph node change of the submandibular, posterior triangle or internal jugular region. Near symmetric uptake of the intracranial compartment. Incidental CT findings: The parotid glands, submandibular glands and thyroid gland are grossly preserved. Visualized paranasal sinuses and mastoid air cells are clear. Streak artifact related to the patient's dental hardware. Scattered vascular calcifications. CHEST: There is a cavitary mass identified in the superior segment left lower lobe abutting the interlobar fissure which has maximum SUV value 9.5. Lesion on CT image 76 measures 2.8 by 2.8 cm. There is some adjacent ground-glass is caudal to this which has some minimal uptake. Spiculated does extend out to the pleura posteriorly as well. No additional areas of abnormal uptake along the lung parenchyma. There is also focal area of abnormal uptake seen along the left hilum maximum SUV value of 5.2. Diameter of the uptake measures 11 mm  on PET image 82. Likely a metastatic regional node. Corresponding focus on CT. No additional areas of abnormal uptake above blood pool elsewhere in the axillary regions, hilum or mediastinum. Incidental CT findings: Heart is nonenlarged. Trace pericardial fluid. Coronary artery calcifications are seen. Please correlate for other coronary risk factors. The thoracic aorta is normal course and caliber with some mild calcified plaque. Normal caliber thoracic esophagus. No pleural effusion or pneumothorax. Breathing motion. There is a semi-solid ill-defined nodular at the right upper lung anteriorly measuring 2.7 by 2.0 cm. This does not show any abnormal uptake. This area has been present and appears relatively similar in appearance going back to a CT scan of 01/30/2021. Of note, the lesion left lung was not seen on that examination of 2022. ABDOMEN/PELVIS: No abnormal hypermetabolic activity within the liver, pancreas, adrenal  glands, or spleen. No hypermetabolic lymph nodes in the abdomen or pelvis. Incidental CT findings: Fatty liver infiltration. Adrenal glands, pancreas, spleen are grossly preserved. Gallbladder is nondilated. Lower pole left-sided renal cyst identified. These appear simple. Photopenic on the PET dataset. Duplicated bilateral renal collecting systems, congenital variant. No definite renal or ureteral stones. Normal caliber aorta and IVC with some scattered vascular calcifications. Preserved contour to the underdistended urinary bladder. Large bowel has a normal course and caliber with scattered colonic stool. Surgical changes along the bases cecum in the appendix is not seen today. Please correlate with history. Stomach and small bowel are nondilated. Small fat containing umbilical hernia. SKELETON: No specific abnormal uptake identified along the visualized osseous structures. Incidental CT findings: Scattered degenerative changes. Focal right sided lower chest wall lipoma. Benign features and no abnormal uptake. IMPRESSION: Cavitary lesion seen in the superior segment of the left lower lobe abutting the interlobar fissure and has spiculations extending out to the pleura is hypermetabolic and worrisome for neoplasm. Maximum SUV value 9.5 and dimension 2.8 x 2.8 cm. Isolated left hypermetabolic prominent hilar lymph node worrisome for regional spread of disease. No additional areas of abnormal radiotracer uptake. Semi-solid ground-glass like nodular area in the anterior right upper lobe is without abnormal uptake and appears similar going back to a CT scan of 2022. Recommend continued surveillance. Scattered colonic diverticula.  Right-sided chest wall lipoma. Coronary artery calcifications. Please correlate for other coronary risk factors. Electronically Signed   By: Ranell Bring M.D.   On: 05/10/2024 17:27   DG Chest Port 1 View Result Date: 05/06/2024 CLINICAL DATA:  Status post bronchoscopy EXAM: PORTABLE CHEST  1 VIEW COMPARISON:  Chest radiograph dated 01/30/2021 FINDINGS: Normal lung volumes. Rounded opacity projecting over the left hilum, likely corresponding to known left lower lobe mass. No pleural effusion or pneumothorax. The heart size and mediastinal contours are within normal limits. No acute osseous abnormality. IMPRESSION: 1. No pneumothorax. 2. Rounded opacity projecting over the left hilum, likely corresponding to known left lower lobe mass. Electronically Signed   By: Limin  Xu M.D.   On: 05/06/2024 09:03   DG C-ARM BRONCHOSCOPY Result Date: 05/06/2024 C-ARM BRONCHOSCOPY: Fluoroscopy was utilized by the requesting physician.  No radiographic interpretation.    ASSESSMENT: This is a very pleasant 63 years old white man diagnosed with stage IIa (T1c, N1, M0) non-small cell lung cancer likely squamous cell carcinoma but adenocarcinoma future could not be completely excluded.  He presented with left lower lobe lung nodule in addition to left hilar adenopathy diagnosed in July 2025.   PLAN: I had a lengthy discussion with the patient and his daughter today about  his current disease stage prognosis and treatment options.  I personally and independently reviewed the imaging studies and discussed the result with the patient and his daughter. Assessment and Plan Assessment & Plan Stage IIA non-small cell lung cancer, left lower lobe with left hilar lymph node involvement Recently diagnosed with stage IIA non-small cell lung cancer, specifically squamous cell carcinoma, located in the left lower lobe with involvement of the left hilar lymph node. Pathology suggests possible adenocarcinoma elements, but treatment remains the same. PET scan confirmed the mass and lymph node involvement, while MRI brain was negative. The primary treatment goal is curative. Surgical resection is preferred if feasible, contingent on cardiac and pulmonary function. If surgery is not possible, chemoradiation is the alternative.  The decision on treatment approach will depend on the results of the pulmonary function test and the thoracic surgeon's assessment. The standard approach is neoadjuvant chemoimmunotherapy followed by surgery, as it shows improved long-term outcomes compared to surgery followed by chemotherapy. - Arrange pulmonary function test - Refer to thoracic surgeon for evaluation - Consider neoadjuvant chemoimmunotherapy if recommended by the surgeon - Plan for potential surgical resection if feasible - If surgery is not feasible, plan for chemoradiation with weekly chemotherapy and daily radiation for six and a half weeks  Tobacco use disorder Long-standing tobacco use, approximately 47 years, with current ongoing smoking. Smoking cessation is critical, especially in the context of potential surgical intervention for lung cancer, as it impacts surgical outcomes and overall prognosis. Quitting smoking is essential to improve surgical candidacy and reduce the risk of recurrence. - Advise smoking cessation - Discuss the importance of quitting smoking prior to potential surgery The patient was advised to call immediately if she has any concerning symptoms in the interval.    The patient voices understanding of current disease status and treatment options and is in agreement with the current care plan.  All questions were answered. The patient knows to call the clinic with any problems, questions or concerns. We can certainly see the patient much sooner if necessary.  Thank you so much for allowing me to participate in the care of [name]. I will continue to follow up the patient with you and assist in his care.  The total time spent in the appointment was 60 minutes including review of chart and various tests results, discussions about plan of care and coordination of care plan .   Disclaimer: This note was dictated with voice recognition software. Similar sounding words can inadvertently be  transcribed and may not be corrected upon review.   Sherrod POUR Alyannah Sanks May 21, 2024, 11:30 AM

## 2024-05-27 ENCOUNTER — Ambulatory Visit (INDEPENDENT_AMBULATORY_CARE_PROVIDER_SITE_OTHER): Admitting: Otolaryngology

## 2024-05-27 ENCOUNTER — Encounter (INDEPENDENT_AMBULATORY_CARE_PROVIDER_SITE_OTHER): Payer: Self-pay | Admitting: Otolaryngology

## 2024-05-27 ENCOUNTER — Ambulatory Visit (HOSPITAL_COMMUNITY)
Admission: RE | Admit: 2024-05-27 | Discharge: 2024-05-27 | Disposition: A | Discharge status: Home / Self Care | Source: Ambulatory Visit | Attending: Acute Care | Admitting: Acute Care

## 2024-05-27 VITALS — BP 112/74 | HR 64

## 2024-05-27 DIAGNOSIS — R040 Epistaxis: Secondary | ICD-10-CM

## 2024-05-27 DIAGNOSIS — C3490 Malignant neoplasm of unspecified part of unspecified bronchus or lung: Secondary | ICD-10-CM | POA: Insufficient documentation

## 2024-05-27 DIAGNOSIS — J449 Chronic obstructive pulmonary disease, unspecified: Secondary | ICD-10-CM | POA: Insufficient documentation

## 2024-05-27 LAB — PULMONARY FUNCTION TEST
DL/VA % pred: 97 %
DL/VA: 4.17 ml/min/mmHg/L
DLCO cor % pred: 94 %
DLCO cor: 22.66 ml/min/mmHg
DLCO unc % pred: 98 %
DLCO unc: 23.62 ml/min/mmHg
FEF 25-75 Post: 1.56 L/s
FEF 25-75 Pre: 1.19 L/s
FEF2575-%Change-Post: 31 %
FEF2575-%Pred-Post: 62 %
FEF2575-%Pred-Pre: 47 %
FEV1-%Change-Post: 7 %
FEV1-%Pred-Post: 67 %
FEV1-%Pred-Pre: 63 %
FEV1-Post: 2.07 L
FEV1-Pre: 1.92 L
FEV1FVC-%Change-Post: -2 %
FEV1FVC-%Pred-Pre: 86 %
FEV6-%Change-Post: 9 %
FEV6-%Pred-Post: 83 %
FEV6-%Pred-Pre: 76 %
FEV6-Post: 3.21 L
FEV6-Pre: 2.93 L
FEV6FVC-%Change-Post: -1 %
FEV6FVC-%Pred-Post: 103 %
FEV6FVC-%Pred-Pre: 104 %
FVC-%Change-Post: 10 %
FVC-%Pred-Post: 80 %
FVC-%Pred-Pre: 72 %
FVC-Post: 3.27 L
FVC-Pre: 2.95 L
Post FEV1/FVC ratio: 63 %
Post FEV6/FVC ratio: 98 %
Pre FEV1/FVC ratio: 65 %
Pre FEV6/FVC Ratio: 99 %
RV % pred: 148 %
RV: 3.06 L
TLC % pred: 99 %
TLC: 6.18 L

## 2024-05-27 MED ORDER — ALBUTEROL SULFATE (2.5 MG/3ML) 0.083% IN NEBU
2.5000 mg | INHALATION_SOLUTION | Freq: Once | RESPIRATORY_TRACT | Status: AC
  Administered 2024-05-27 (×2): 2.5 mg via RESPIRATORY_TRACT

## 2024-05-27 NOTE — Patient Instructions (Signed)
 Epistaxis prevention instructions given to the patient: - use nasal saline spray x6/day and Vaseline twice a day to prevent nose bleeds - for active nose bleeds use Afrin and nasal tip pressure x 10 min to stop them - if nose bleed does not stop with above measures, please go to Emergency Room  - please see your primary care provider to check your blood pressure and make sure it is under control - return for recurrent nose bleeds and we will consider cautery of your nasal blood vessels  - Purchase BleedStop to have at home and help with epistaxis control

## 2024-05-27 NOTE — Progress Notes (Signed)
 ENT CONSULT:  Reason for Consult: epistaxis    HPI: Discussed the use of AI scribe software for clinical note transcription with the patient, who gave verbal consent to proceed.  History of Present Illness Nathan [name] is a 63 year old male with a history of heart attack and stent placement, on ASA and Plavix , who presents with nosebleeds.  He has experienced a couple of episodes of epistaxis over the past few months, primarily on the right side. These episodes were not severe and did not require emergency care or nasal packing.  He is currently on Plavix  and aspirin due to a heart attack and stent placement that occurred twenty years ago. Last nose bleed was 2 months ago.     Past Medical History:  Diagnosis Date   Anxiety    pt denies   CAD (coronary artery disease)    Diabetes mellitus    Hepatic steatosis    Hypercholesteremia    Hypertension    Myocardial infarction [facility]) 10/18/2003    Past Surgical History:  Procedure Laterality Date   APPENDECTOMY     CARDIAC CATHETERIZATION  01/25/2004   Cypher DES (3.5x33mm) for PPMI to RCA (Dr. IVAR Sor)   CARDIAC CATHETERIZATION  01/03/2005   patent stent with noncritical 30% Cfx disease and 30% LAD disease (Dr. DOROTHA Schwalbe)   COLONOSCOPY  09/06/2011   Procedure: COLONOSCOPY;  Surgeon: Oneil DELENA Budge;  [facility];  Service: Gastroenterology;  Laterality: N/A;   COLONOSCOPY WITH PROPOFOL  N/A 01/20/2022   Procedure: COLONOSCOPY WITH PROPOFOL ;  Surgeon: Shaaron Lamar HERO, MD;  Location: [facility];  Service: Endoscopy;  Laterality: N/A;  9:15am   NM MYOCAR PERF WALL MOTION  11/2010   bruce myoview ; normal pattern of perfusion in all regions, post-stress EF 62%; normal, low risk scan with improved perfusion from previous study   TRANSTHORACIC ECHOCARDIOGRAM  12/2011   EF=>55%, mild conc LVH; LA mildly dilated; trace MR & TR with normal RSVP; trace pulm valve regurg   VIDEO BRONCHOSCOPY WITH ENDOBRONCHIAL NAVIGATION Left  05/06/2024   Procedure: VIDEO BRONCHOSCOPY WITH ENDOBRONCHIAL NAVIGATION;  Surgeon: [name], MD;  Location: [facility];  Service: Pulmonary;  Laterality: Left;    Family History  Problem Relation Age of Onset   Cancer Mother    Heart disease Mother    Cancer Sister    Heart disease Maternal Grandmother    Diabetes Paternal Grandmother    Sudden death Paternal Grandmother    Colon cancer Neg Hx     Social History:  reports that he has been smoking cigarettes. He has a 52.5 pack-year smoking history. His smokeless tobacco use includes snuff. He reports current alcohol use. He reports that he does not use drugs.  Allergies: No Known Allergies  Medications: I have reviewed the patient's current medications.   The PMH, PSH, Medications, Allergies, and SH were reviewed and updated.  ROS: Constitutional: Negative for fever, weight loss and weight gain. Cardiovascular: Negative for chest pain and dyspnea on exertion. Respiratory: Is not experiencing shortness of breath at rest. Gastrointestinal: Negative for nausea and vomiting. Neurological: Negative for headaches. Psychiatric: The patient is not nervous/anxious  Blood pressure 112/74, pulse 64, SpO2 94%.  PHYSICAL EXAM:  Exam: General: Well-developed, well-nourished Respiratory Respiratory effort: Equal inspiration and expiration without stridor Cardiovascular Peripheral Vascular: Warm extremities with equal color/perfusion Eyes: No nystagmus with equal extraocular motion bilaterally Neuro/Psych/Balance: Patient oriented to person, place, and time; Appropriate mood and affect; Gait is intact with  no imbalance; Cranial nerves I-XII are intact Head and Face Inspection: Normocephalic and atraumatic without mass or lesion Palpation: Facial skeleton intact without bony stepoffs Salivary Glands: No mass or tenderness Facial Strength: Facial motility symmetric and full bilaterally ENT Pinna: External ear intact and fully  developed External canal: Canal is patent with intact skin Tympanic Membrane: Clear and mobile External Nose: No scar or anatomic deformity Internal Nose: Septum is without significant deviation or evidence of prominent blood vessels on anterior rhinoscopy. No polyp, or purulence. Mucosal edema and erythema present.  Bilateral inferior turbinate hypertrophy.  Lips, Teeth, and gums: Mucosa and teeth intact and viable TMJ: No pain to palpation with full mobility Oral cavity/oropharynx: No erythema or exudate, no lesions present Nasopharynx: No mass or lesion with intact mucosa Neck Neck and Trachea: Midline trachea without mass or lesion Thyroid: No mass or nodularity Lymphatics: No lymphadenopathy   Assessment/Plan: Encounter Diagnosis  Name Primary?   Epistaxis Yes    Assessment and Plan Assessment & Plan Right-sided epistaxis (not active) Intermittent right-sided epistaxis, likely exacerbated by blood thinners and dry air. Only had two episodes and last one 2 months ago. No severe episodes which required packing. Higher risk due to anticoagulation and environmental factors. - Provided handout with epistaxis prevention recommendations. - Advised use of saline nasal sprays twice daily. - Recommended Vaseline application to nasal passages before bed. - Advised monitoring for increased frequency and return if needed.   Thank you for allowing me to participate in the care of this patient. Please do not hesitate to contact me with any questions or concerns.   Elena Larry, MD Otolaryngology [facility] Phone: [phone] Fax: [phone]    05/27/2024, 8:26 AM

## 2024-05-29 ENCOUNTER — Other Ambulatory Visit: Payer: Self-pay

## 2024-05-29 ENCOUNTER — Telehealth: Payer: Self-pay | Admitting: Internal Medicine

## 2024-05-29 MED ORDER — NITROGLYCERIN 0.4 MG SL SUBL: 0.4000 mg | SUBLINGUAL_TABLET | SUBLINGUAL | 3 refills | Status: AC | PRN

## 2024-05-29 NOTE — Telephone Encounter (Signed)
*  STAT* If patient is at the pharmacy, call can be transferred to refill team.   1. Which medications need to be refilled? (please list name of each medication and dose if known)   nitroGLYCERIN  (NITROSTAT ) 0.4 MG SL tablet (Expired)   2. Would you like to learn more about the convenience, safety, & potential cost savings by using the [facility]?   3. Are you open to using the [facility] (Type [facility]. ).  4. Which pharmacy/location (including street and city if local pharmacy) is medication to be sent to?  [id number] - [place], [place] - [address] AT [address]   5. Do they need a 30 day or 90 day supply?   Patient stated his prescription has expired and he is completely out of this medication.

## 2024-05-30 ENCOUNTER — Ambulatory Visit
Attending: Thoracic Surgery (Cardiothoracic Vascular Surgery) | Admitting: Thoracic Surgery (Cardiothoracic Vascular Surgery)

## 2024-05-30 VITALS — BP 125/79 | HR 63 | Resp 20 | Ht 66.0 in | Wt 160.0 lb

## 2024-05-30 DIAGNOSIS — C3432 Malignant neoplasm of lower lobe, left bronchus or lung: Secondary | ICD-10-CM

## 2024-05-30 NOTE — Progress Notes (Signed)
 PCP is [name], MD Referring Provider is Shannon Agent, MD  Chief Complaint  Patient presents with   Lung Cancer    Surgical consult, Bronch 05/06/24/ Chest CT 05/04/24/ PET Scan 05/10/24/ MR Brain 05/18/24/ PFT's 05/27/24    HPI: [name] send consultation regarding non-small cell carcinoma of the lung.  Nathan [name] is a 63 year old gentleman with a history of tobacco abuse, coronary disease, MI, stent, hypertension, hyperlipidemia, type 2 diabetes, and hepatic steatosis.  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  That was a low risk study, but he was incidentally noted to have a left lower lobe lung mass.  CT of the chest showed a 2.8 cm spiculated nodule in the superior segment of the left lower lobe and a prominent left hilar lymph node.  On PET/CT both the mass and level 11 node were hypermetabolic.  There was no other evidence of disease.  There also was a groundglass opacity in the right upper lobe that has been stable for several years.  He underwent navigational bronchoscopy.  Biopsy showed non-small cell carcinoma.  He denies any chest pain, pressure, tightness.  He is short of breath with heavy activity such as walking up hills but not with routine activities at work.  Does a lot of hunting.  No change in appetite or weight loss.  No unusual headaches or visual changes.  Zubrod Score: At the time of surgery this patient's most appropriate activity status/level should be described as: [x]     0    Normal activity, no symptoms []     1    Restricted in physical strenuous activity but ambulatory, able to do out light work []     2    Ambulatory and capable of self care, unable to do work activities, up and about >50 % of waking hours                              []     3    Only limited self care, in bed greater than 50% of waking hours []     4    Completely disabled, no self care, confined to bed or chair []     5    Moribund  Past  Medical History:  Diagnosis Date   Anxiety    pt denies   CAD (coronary artery disease)    Diabetes mellitus    Hepatic steatosis    Hypercholesteremia    Hypertension    Myocardial infarction ([facility]) 10/18/2003    Past Surgical History:  Procedure Laterality Date   APPENDECTOMY     CARDIAC CATHETERIZATION  01/25/2004   Cypher DES (3.5x42mm) for PPMI to RCA (Dr. IVAR Sor)   CARDIAC CATHETERIZATION  01/03/2005   patent stent with noncritical 30% Cfx disease and 30% LAD disease (Dr. DOROTHA Schwalbe)   COLONOSCOPY  09/06/2011   Procedure: COLONOSCOPY;  Surgeon: Oneil DELENA Budge;  [facility];  Service: Gastroenterology;  Laterality: N/A;   COLONOSCOPY WITH PROPOFOL  N/A 01/20/2022   Procedure: COLONOSCOPY WITH PROPOFOL ;  Surgeon: Shaaron Lamar HERO, MD;  [facility];  Service: Endoscopy;  Laterality: N/A;  9:15am   NM MYOCAR PERF WALL MOTION  11/2010   bruce myoview ; normal pattern of perfusion in all regions, post-stress EF 62%; normal, low risk scan with improved perfusion from previous study   TRANSTHORACIC ECHOCARDIOGRAM  12/2011   EF=>55%,  mild conc LVH; LA mildly dilated; trace MR & TR with normal RSVP; trace pulm valve regurg   VIDEO BRONCHOSCOPY WITH ENDOBRONCHIAL NAVIGATION Left 05/06/2024   Procedure: VIDEO BRONCHOSCOPY WITH ENDOBRONCHIAL NAVIGATION;  Surgeon: [name], MD;  Location: [facility];  Service: Pulmonary;  Laterality: Left;    Family History  Problem Relation Age of Onset   Cancer Mother    Heart disease Mother    Cancer Sister    Heart disease Maternal Grandmother    Diabetes Paternal Grandmother    Sudden death Paternal Grandmother    Colon cancer Neg Hx     Social History Social History   Tobacco Use   Smoking status: Every Day    Current packs/day: 1.50    Average packs/day: 1.5 packs/day for 35.0 years (52.5 ttl pk-yrs)    Types: Cigarettes   Smokeless tobacco: Current    Types: Snuff   Tobacco comments:    1 pack a day 04/29/2024  KRD  Vaping Use   Vaping status: Never Used  Substance Use Topics   Alcohol use: Yes    Comment: occasionally, maybe once a month   Drug use: No    Current Outpatient Medications  Medication Sig Dispense Refill   acetaminophen  (TYLENOL ) 500 MG tablet Take 1,000 mg by mouth as needed. For pain     aspirin EC 81 MG tablet Take 81 mg by mouth at bedtime.     augmented betamethasone dipropionate (DIPROLENE-AF) 0.05 % cream Apply 1 application. topically 2 (two) times daily as needed (skin itching/irritation.).     clopidogrel  (PLAVIX ) 75 MG tablet TAKE 1 TABLET BY MOUTH EVERY DAY (Patient taking differently: Take 75 mg by mouth at bedtime.) 90 tablet 3   doxycycline  (VIBRA -TABS) 100 MG tablet Take 1 tablet (100 mg total) by mouth 2 (two) times daily. 14 tablet 0   hydrochlorothiazide (HYDRODIURIL) 25 MG tablet Take 25 mg by mouth in the morning.     metFORMIN (GLUCOPHAGE) 1000 MG tablet Take 1,000 mg by mouth 2 (two) times daily.     metoprolol (LOPRESSOR) 50 MG tablet Take 50 mg by mouth 2 (two) times daily.      nitroGLYCERIN  (NITROSTAT ) 0.4 MG SL tablet Place 1 tablet (0.4 mg total) under the tongue every 5 (five) minutes as needed for chest pain. Max 3 dose 25 tablet 3   olmesartan  (BENICAR ) 40 MG tablet TAKE 1 TABLET (40 MG TOTAL) BY MOUTH IN THE MORNING 90 tablet 3   rosuvastatin  (CRESTOR ) 10 MG tablet Take 1 tablet (10 mg total) by mouth daily. 90 tablet 3   No current facility-administered medications for this visit.    No Known Allergies  Review of Systems  Constitutional:  Negative for activity change, fatigue and unexpected weight change.  HENT:  Negative for trouble swallowing and voice change.   Eyes:  Negative for visual disturbance.  Respiratory:  Positive for cough and shortness of breath (With heavy activity).   Cardiovascular:  Negative for chest pain and leg swelling.  Genitourinary:  Negative for difficulty urinating and dysuria.  Musculoskeletal:  Negative for  arthralgias.  Neurological:  Negative for seizures and weakness.  Hematological:  Bruises/bleeds easily (On Plavix ).  All other systems reviewed and are negative.   BP 125/79   Pulse 63   Resp 20   Ht 5' 6 (1.676 m)   Wt 160 lb (72.6 kg)   SpO2 96% Comment: RA  BMI 25.82 kg/m  Physical Exam Vitals reviewed.  Constitutional:  General: He is not in acute distress.    Appearance: Normal appearance.  HENT:     Head: Normocephalic and atraumatic.  Eyes:     General: No scleral icterus.    Extraocular Movements: Extraocular movements intact.  Neck:     Vascular: No carotid bruit.  Cardiovascular:     Rate and Rhythm: Normal rate and regular rhythm.     Heart sounds: Normal heart sounds. No murmur heard.    No friction rub. No gallop.  Pulmonary:     Effort: Pulmonary effort is normal. No respiratory distress.     Breath sounds: Normal breath sounds. No wheezing or rales.  Abdominal:     General: There is no distension.     Palpations: Abdomen is soft.  Lymphadenopathy:     Cervical: No cervical adenopathy.  Skin:    General: Skin is warm and dry.  Neurological:     General: No focal deficit present.     Mental Status: He is alert and oriented to person, place, and time.     Cranial Nerves: No cranial nerve deficit.     Motor: No weakness.    Diagnostic Tests: NUCLEAR MEDICINE PET SKULL BASE TO THIGH   TECHNIQUE: 8.72 mCi F-18 FDG was injected intravenously. Full-ring PET imaging was performed from the skull base to thigh after the radiotracer. CT data was obtained and used for attenuation correction and anatomic localization.   Fasting blood glucose: 106 mg/dl   COMPARISON:  CT chest 05/04/2024 and older   FINDINGS: Mediastinal blood pool activity: SUV max 1.7   Liver activity: SUV max 2.3   NECK: No specific abnormal uptake identified in the neck including along lymph node change of the submandibular, posterior triangle or internal jugular region.  Near symmetric uptake of the intracranial compartment.   Incidental CT findings: The parotid glands, submandibular glands and thyroid gland are grossly preserved. Visualized paranasal sinuses and mastoid air cells are clear. Streak artifact related to the patient's dental hardware. Scattered vascular calcifications.   CHEST: There is a cavitary mass identified in the superior segment left lower lobe abutting the interlobar fissure which has maximum SUV value 9.5. Lesion on CT image 76 measures 2.8 by 2.8 cm. There is some adjacent ground-glass is caudal to this which has some minimal uptake. Spiculated does extend out to the pleura posteriorly as well. No additional areas of abnormal uptake along the lung parenchyma.   There is also focal area of abnormal uptake seen along the left hilum maximum SUV value of 5.2. Diameter of the uptake measures 11 mm on PET image 82. Likely a metastatic regional node. Corresponding focus on CT. No additional areas of abnormal uptake above blood pool elsewhere in the axillary regions, hilum or mediastinum.   Incidental CT findings: Heart is nonenlarged. Trace pericardial fluid. Coronary artery calcifications are seen. Please correlate for other coronary risk factors. The thoracic aorta is normal course and caliber with some mild calcified plaque. Normal caliber thoracic esophagus. No pleural effusion or pneumothorax. Breathing motion. There is a semi-solid ill-defined nodular at the right upper lung anteriorly measuring 2.7 by 2.0 cm. This does not show any abnormal uptake. This area has been present and appears relatively similar in appearance going back to a CT scan of 01/30/2021. Of note, the lesion left lung was not seen on that examination of 2022.   ABDOMEN/PELVIS: No abnormal hypermetabolic activity within the liver, pancreas, adrenal glands, or spleen. No hypermetabolic lymph nodes in the abdomen or  pelvis.   Incidental CT findings: Fatty  liver infiltration. Adrenal glands, pancreas, spleen are grossly preserved. Gallbladder is nondilated. Lower pole left-sided renal cyst identified. These appear simple. Photopenic on the PET dataset. Duplicated bilateral renal collecting systems, congenital variant. No definite renal or ureteral stones. Normal caliber aorta and IVC with some scattered vascular calcifications. Preserved contour to the underdistended urinary bladder. Large bowel has a normal course and caliber with scattered colonic stool. Surgical changes along the bases cecum in the appendix is not seen today. Please correlate with history. Stomach and small bowel are nondilated. Small fat containing umbilical hernia.   SKELETON: No specific abnormal uptake identified along the visualized osseous structures.   Incidental CT findings: Scattered degenerative changes. Focal right sided lower chest wall lipoma. Benign features and no abnormal uptake.   IMPRESSION: Cavitary lesion seen in the superior segment of the left lower lobe abutting the interlobar fissure and has spiculations extending out to the pleura is hypermetabolic and worrisome for neoplasm. Maximum SUV value 9.5 and dimension 2.8 x 2.8 cm.   Isolated left hypermetabolic prominent hilar lymph node worrisome for regional spread of disease.   No additional areas of abnormal radiotracer uptake.   Semi-solid ground-glass like nodular area in the anterior right upper lobe is without abnormal uptake and appears similar going back to a CT scan of 2022. Recommend continued surveillance.   Scattered colonic diverticula.  Right-sided chest wall lipoma.   Coronary artery calcifications. Please correlate for other coronary risk factors.     Electronically Signed   By: Ranell Bring M.D.   On: 05/10/2024 17:27  I personally reviewed the CT and PET/CT images.  2.8 cm spiculated mass superior to the left lower lobe with hilar adenopathy.  Groundglass opacity  right upper lobe unchanged.  No evidence of distant metastasis.  Coronary and aortic atherosclerosis  Pulmonary function testing 05/27/2024 FVC 2.95 (72%) FEV1 1.92 (63%) FEV1 2.07 (67%) postbronchodilator DLCO 22.66 (94%)  Impression: Nathan [name] is a 63 year old gentleman with a history of tobacco abuse, coronary disease, MI, stent, hypertension, hyperlipidemia, type 2 diabetes, and hepatic steatosis.  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  Clinical stage IIb (T1, N1).  I discussed this personally with our protocol now for stage IIb disease is to treat with neoadjuvant chemoimmunotherapy and then proceed with surgical resection.  This approach gives us  a better chance of complete resection at the time of surgery, and more often than not we have had complete pathologic responses to the regimen.  That would give him overall the best prognosis.  We did discuss surgical management.  The plan would be to do a robotic assisted left lower lobectomy and node dissection.  We will discuss that further when we approach the procedure.  Tobacco abuse-sincerely trying to quit.  Has not smoked in 2 days.  Admits that it is difficult.  Emphasized the importance of complete cessation.  Hypertension-blood pressure well-controlled on current regimen.  Hyperlipidemia-on rosuvastatin .  CAD-MI and stent in 2006.  Recent myocardial perfusion scan was low risk.  No anginal symptoms.  Low risk for cardiac complications with surgery.  Plan: Follow-up with Dr. Sherrod to begin neoadjuvant chemoimmunotherapy.  I will plan to see him back in about 10 weeks or so to planned surgery. Will need to hold Plavix  prior to surgery  Elspeth BROCKS  Kerrin, MD [facility] [phone]

## 2024-05-31 ENCOUNTER — Telehealth: Payer: Self-pay | Admitting: Medical Oncology

## 2024-05-31 NOTE — Telephone Encounter (Signed)
 Patient stated that Dr. Kerrin recommended chemotherapy and immunotherapy as the initial treatment, followed by surgery.   I advised the patient and his daughter to maintain all upcoming appointments.   Discussed the importance of scheduling accordingly, as the patient is a truck driver.   Informed the patient that no treatment is scheduled for 08/21.

## 2024-06-06 ENCOUNTER — Inpatient Hospital Stay

## 2024-06-06 ENCOUNTER — Inpatient Hospital Stay (HOSPITAL_BASED_OUTPATIENT_CLINIC_OR_DEPARTMENT_OTHER): Admitting: Internal Medicine

## 2024-06-06 VITALS — BP 121/77 | HR 54 | Temp 98.7°F | Resp 16 | Ht 66.0 in | Wt 163.0 lb

## 2024-06-06 DIAGNOSIS — C3432 Malignant neoplasm of lower lobe, left bronchus or lung: Secondary | ICD-10-CM | POA: Diagnosis not present

## 2024-06-06 LAB — CBC WITH DIFFERENTIAL (CANCER CENTER ONLY)
Abs Immature Granulocytes: 0.02 K/uL (ref 0.00–0.07)
Basophils Absolute: 0.1 K/uL (ref 0.0–0.1)
Basophils Relative: 1 %
Eosinophils Absolute: 0.3 K/uL (ref 0.0–0.5)
Eosinophils Relative: 3 %
HCT: 41.4 % (ref 39.0–52.0)
Hemoglobin: 15 g/dL (ref 13.0–17.0)
Immature Granulocytes: 0 %
Lymphocytes Relative: 25 %
Lymphs Abs: 2 K/uL (ref 0.7–4.0)
MCH: 31.9 pg (ref 26.0–34.0)
MCHC: 36.2 g/dL — ABNORMAL HIGH (ref 30.0–36.0)
MCV: 88.1 fL (ref 80.0–100.0)
Monocytes Absolute: 0.6 K/uL (ref 0.1–1.0)
Monocytes Relative: 8 %
Neutro Abs: 4.9 K/uL (ref 1.7–7.7)
Neutrophils Relative %: 63 %
Platelet Count: 265 K/uL (ref 150–400)
RBC: 4.7 MIL/uL (ref 4.22–5.81)
RDW: 12.4 % (ref 11.5–15.5)
WBC Count: 7.8 K/uL (ref 4.0–10.5)
nRBC: 0 % (ref 0.0–0.2)

## 2024-06-06 LAB — CMP (CANCER CENTER ONLY)
ALT: 20 U/L (ref 0–44)
AST: 15 U/L (ref 15–41)
Albumin: 4.2 g/dL (ref 3.5–5.0)
Alkaline Phosphatase: 65 U/L (ref 38–126)
Anion gap: 6 (ref 5–15)
BUN: 13 mg/dL (ref 8–23)
CO2: 31 mmol/L (ref 22–32)
Calcium: 9.4 mg/dL (ref 8.9–10.3)
Chloride: 104 mmol/L (ref 98–111)
Creatinine: 0.83 mg/dL (ref 0.61–1.24)
GFR, Estimated: >60 mL/min (ref >60)
Glucose, Bld: 126 mg/dL — ABNORMAL HIGH (ref 70–99)
Potassium: 4.6 mmol/L (ref 3.5–5.1)
Sodium: 141 mmol/L (ref 135–145)
Total Bilirubin: 0.4 mg/dL (ref 0.0–1.2)
Total Protein: 6.7 g/dL (ref 6.5–8.1)

## 2024-06-06 MED ORDER — ONDANSETRON HCL 8 MG PO TABS: 8.0000 mg | ORAL_TABLET | Freq: Three times a day (TID) | ORAL | 1 refills | Status: DC | PRN

## 2024-06-06 MED ORDER — PROCHLORPERAZINE MALEATE 10 MG PO TABS
10.0000 mg | ORAL_TABLET | Freq: Four times a day (QID) | ORAL | 1 refills | Status: DC | PRN
Start: 2024-06-06 — End: 2024-08-15

## 2024-06-06 NOTE — Progress Notes (Signed)
 [facility] Telephone:([phone]) [phone]   Fax:([phone]  OFFICE PROGRESS NOTE  [name], MD [address] [place] [address]  DIAGNOSIS: Stage IIa (T1c, N1, M0) non-small cell lung cancer likely squamous cell carcinoma but adenocarcinoma future could not be completely excluded. He presented with left lower lobe lung nodule in addition to left hilar adenopathy diagnosed in July 2025.   PRIOR THERAPY: None  CURRENT THERAPY: Neoadjuvant systemic chemoimmunotherapy with carboplatin for AUC of 6, paclitaxel 200 Mg/M2 and nivolumab 360 Mg IV every 3 weeks with Neulasta support.  First dose 06/13/2024.  INTERVAL HISTORY: Nathan [name] 63 y.o. male returns to the clinic today for follow-up visit accompanied by his daughter. Discussed the use of AI scribe software for clinical note transcription with the patient, who gave verbal consent to proceed.  History of Present Illness Nathan [name] is a 63 year old male with stage 2 non-small cell lung cancer who presents for evaluation and discussion of neoadjuvant systemic chemoimmunotherapy. He is accompanied by [name], his youngest daughter. He was referred by Dr. Kerrin for consideration of neoadjuvant systemic chemoimmunotherapy.  He was diagnosed with stage 2 non-small cell lung cancer, likely squamous cell carcinoma, in July 2025. The chemotherapy regimen includes carboplatin and paclitaxel, and the immunotherapy involves nivolumab. The injection to boost white blood cells is Neulasta.  He is a [employer] and is typically away for a week or three days at a time, which may impact his treatment schedule.     MEDICAL HISTORY: Past Medical History:  Diagnosis Date   Anxiety    pt denies   CAD (coronary artery disease)    Diabetes mellitus    Hepatic steatosis    Hypercholesteremia    Hypertension    Myocardial infarction (HCC) 10/18/2003    ALLERGIES:  has no known allergies.  MEDICATIONS:   Current Outpatient Medications  Medication Sig Dispense Refill   acetaminophen  (TYLENOL ) 500 MG tablet Take 1,000 mg by mouth as needed. For pain     aspirin EC 81 MG tablet Take 81 mg by mouth at bedtime.     augmented betamethasone dipropionate (DIPROLENE-AF) 0.05 % cream Apply 1 application. topically 2 (two) times daily as needed (skin itching/irritation.).     clopidogrel  (PLAVIX ) 75 MG tablet TAKE 1 TABLET BY MOUTH EVERY DAY (Patient taking differently: Take 75 mg by mouth at bedtime.) 90 tablet 3   doxycycline  (VIBRA -TABS) 100 MG tablet Take 1 tablet (100 mg total) by mouth 2 (two) times daily. 14 tablet 0   hydrochlorothiazide (HYDRODIURIL) 25 MG tablet Take 25 mg by mouth in the morning.     metFORMIN (GLUCOPHAGE) 1000 MG tablet Take 1,000 mg by mouth 2 (two) times daily.     metoprolol (LOPRESSOR) 50 MG tablet Take 50 mg by mouth 2 (two) times daily.      nitroGLYCERIN  (NITROSTAT ) 0.4 MG SL tablet Place 1 tablet (0.4 mg total) under the tongue every 5 (five) minutes as needed for chest pain. Max 3 dose 25 tablet 3   olmesartan  (BENICAR ) 40 MG tablet TAKE 1 TABLET (40 MG TOTAL) BY MOUTH IN THE MORNING 90 tablet 3   rosuvastatin  (CRESTOR ) 10 MG tablet Take 1 tablet (10 mg total) by mouth daily. 90 tablet 3   No current facility-administered medications for this visit.    SURGICAL HISTORY:  Past Surgical History:  Procedure Laterality Date   APPENDECTOMY     CARDIAC CATHETERIZATION  01/25/2004   Cypher  DES (3.5x62mm) for PPMI to RCA (Dr. IVAR Sor)   CARDIAC CATHETERIZATION  01/03/2005   patent stent with noncritical 30% Cfx disease and 30% LAD disease (Dr. DOROTHA Schwalbe)   COLONOSCOPY  09/06/2011   Procedure: COLONOSCOPY;  Surgeon: Oneil DELENA Budge;  Location: [facility];  Service: Gastroenterology;  Laterality: N/A;   COLONOSCOPY WITH PROPOFOL  N/A 01/20/2022   Procedure: COLONOSCOPY WITH PROPOFOL ;  Surgeon: Shaaron Lamar HERO, MD;  Location: [facility];  Service: Endoscopy;  Laterality:  N/A;  9:15am   NM MYOCAR PERF WALL MOTION  11/2010   bruce myoview ; normal pattern of perfusion in all regions, post-stress EF 62%; normal, low risk scan with improved perfusion from previous study   TRANSTHORACIC ECHOCARDIOGRAM  12/2011   EF=>55%, mild conc LVH; LA mildly dilated; trace MR & TR with normal RSVP; trace pulm valve regurg   VIDEO BRONCHOSCOPY WITH ENDOBRONCHIAL NAVIGATION Left 05/06/2024   Procedure: VIDEO BRONCHOSCOPY WITH ENDOBRONCHIAL NAVIGATION;  Surgeon: [name], MD;  Location: [facility];  Service: Pulmonary;  Laterality: Left;    REVIEW OF SYSTEMS:  Constitutional: negative Eyes: negative Ears, nose, mouth, throat, and face: negative Respiratory: positive for cough Cardiovascular: negative Gastrointestinal: negative Genitourinary:negative Integument/breast: negative Hematologic/lymphatic: negative Musculoskeletal:negative Neurological: negative Behavioral/Psych: negative Endocrine: negative Allergic/Immunologic: negative   PHYSICAL EXAMINATION: General appearance: alert, cooperative, and no distress Head: Normocephalic, without obvious abnormality, atraumatic Neck: no adenopathy, no JVD, supple, symmetrical, trachea midline, and thyroid not enlarged, symmetric, no tenderness/mass/nodules Lymph nodes: Cervical, supraclavicular, and axillary nodes normal. Resp: clear to auscultation bilaterally Back: symmetric, no curvature. ROM normal. No CVA tenderness. Cardio: regular rate and rhythm, S1, S2 normal, no murmur, click, rub or gallop GI: soft, non-tender; bowel sounds normal; no masses,  no organomegaly Extremities: extremities normal, atraumatic, no cyanosis or edema Neurologic: Alert and oriented X 3, normal strength and tone. Normal symmetric reflexes. Normal coordination and gait  ECOG PERFORMANCE STATUS: 1 - Symptomatic but completely ambulatory  Blood pressure 121/77, pulse (!) 54, temperature 98.7 F (37.1 C), temperature source Temporal, resp.  rate 16, height 5' 6 (1.676 m), weight 163 lb (73.9 kg), SpO2 100%.  LABORATORY DATA: Lab Results  Component Value Date   WBC 7.8 06/06/2024   HGB 15.0 06/06/2024   HCT 41.4 06/06/2024   MCV 88.1 06/06/2024   PLT 265 06/06/2024      Chemistry      Component Value Date/Time   NA 141 06/06/2024 0820   K 4.6 06/06/2024 0820   CL 104 06/06/2024 0820   CO2 31 06/06/2024 0820   BUN 13 06/06/2024 0820   CREATININE 0.83 06/06/2024 0820   CREATININE 0.83 05/18/2013 1055      Component Value Date/Time   CALCIUM  9.4 06/06/2024 0820   ALKPHOS 65 06/06/2024 0820   AST 15 06/06/2024 0820   ALT 20 06/06/2024 0820   BILITOT 0.4 06/06/2024 0820       RADIOGRAPHIC STUDIES: MR BRAIN W WO CONTRAST Result Date: 05/18/2024 CLINICAL DATA:  Provided history: Malignant neoplasm of unspecified part of unspecified bronchus or lung. Non-small cell lung cancer, staging. EXAM: MRI HEAD WITHOUT AND WITH CONTRAST TECHNIQUE: Multiplanar, multiecho pulse sequences of the brain and surrounding structures were obtained without and with intravenous contrast. CONTRAST:  7mL GADAVIST  GADOBUTROL  1 MMOL/ML IV SOLN COMPARISON:  PET CT 05/10/2024. FINDINGS: Brain: No age-advanced or lobar predominant cerebral atrophy. A few small foci of T2 FLAIR hyperintense signal abnormality scattered within the cerebral white matter are nonspecific, but most often  secondary to chronic small vessel ischemia. Punctate chronic microhemorrhage within the anterior left temporal lobe. No cortical encephalomalacia is identified. There is no acute infarct. No evidence of an intracranial mass. No extra-axial fluid collection. No midline shift. No pathologic intracranial enhancement identified. Vascular: Maintained flow voids within the proximal large arterial vessels. Skull and upper cervical spine: No focal worrisome marrow lesion. Sinuses/Orbits: No mass or acute finding within the imaged orbits. Trace mucosal thickening within the left  frontal sinus. IMPRESSION: No evidence of intracranial metastatic disease. Electronically Signed   By: Rockey Childs D.O.   On: 05/18/2024 20:32   NM PET Image Initial (PI) Skull Base To Thigh (F-18 FDG) Result Date: 05/10/2024 CLINICAL DATA:  Initial treatment strategy for lung mass. EXAM: NUCLEAR MEDICINE PET SKULL BASE TO THIGH TECHNIQUE: 8.72 mCi F-18 FDG was injected intravenously. Full-ring PET imaging was performed from the skull base to thigh after the radiotracer. CT data was obtained and used for attenuation correction and anatomic localization. Fasting blood glucose: 106 mg/dl COMPARISON:  CT chest 92/80/7974 and older FINDINGS: Mediastinal blood pool activity: SUV max 1.7 Liver activity: SUV max 2.3 NECK: No specific abnormal uptake identified in the neck including along lymph node change of the submandibular, posterior triangle or internal jugular region. Near symmetric uptake of the intracranial compartment. Incidental CT findings: The parotid glands, submandibular glands and thyroid gland are grossly preserved. Visualized paranasal sinuses and mastoid air cells are clear. Streak artifact related to the patient's dental hardware. Scattered vascular calcifications. CHEST: There is a cavitary mass identified in the superior segment left lower lobe abutting the interlobar fissure which has maximum SUV value 9.5. Lesion on CT image 76 measures 2.8 by 2.8 cm. There is some adjacent ground-glass is caudal to this which has some minimal uptake. Spiculated does extend out to the pleura posteriorly as well. No additional areas of abnormal uptake along the lung parenchyma. There is also focal area of abnormal uptake seen along the left hilum maximum SUV value of 5.2. Diameter of the uptake measures 11 mm on PET image 82. Likely a metastatic regional node. Corresponding focus on CT. No additional areas of abnormal uptake above blood pool elsewhere in the axillary regions, hilum or mediastinum. Incidental CT  findings: Heart is nonenlarged. Trace pericardial fluid. Coronary artery calcifications are seen. Please correlate for other coronary risk factors. The thoracic aorta is normal course and caliber with some mild calcified plaque. Normal caliber thoracic esophagus. No pleural effusion or pneumothorax. Breathing motion. There is a semi-solid ill-defined nodular at the right upper lung anteriorly measuring 2.7 by 2.0 cm. This does not show any abnormal uptake. This area has been present and appears relatively similar in appearance going back to a CT scan of 01/30/2021. Of note, the lesion left lung was not seen on that examination of 2022. ABDOMEN/PELVIS: No abnormal hypermetabolic activity within the liver, pancreas, adrenal glands, or spleen. No hypermetabolic lymph nodes in the abdomen or pelvis. Incidental CT findings: Fatty liver infiltration. Adrenal glands, pancreas, spleen are grossly preserved. Gallbladder is nondilated. Lower pole left-sided renal cyst identified. These appear simple. Photopenic on the PET dataset. Duplicated bilateral renal collecting systems, congenital variant. No definite renal or ureteral stones. Normal caliber aorta and IVC with some scattered vascular calcifications. Preserved contour to the underdistended urinary bladder. Large bowel has a normal course and caliber with scattered colonic stool. Surgical changes along the bases cecum in the appendix is not seen today. Please correlate with history. Stomach and small bowel  are nondilated. Small fat containing umbilical hernia. SKELETON: No specific abnormal uptake identified along the visualized osseous structures. Incidental CT findings: Scattered degenerative changes. Focal right sided lower chest wall lipoma. Benign features and no abnormal uptake. IMPRESSION: Cavitary lesion seen in the superior segment of the left lower lobe abutting the interlobar fissure and has spiculations extending out to the pleura is hypermetabolic and  worrisome for neoplasm. Maximum SUV value 9.5 and dimension 2.8 x 2.8 cm. Isolated left hypermetabolic prominent hilar lymph node worrisome for regional spread of disease. No additional areas of abnormal radiotracer uptake. Semi-solid ground-glass like nodular area in the anterior right upper lobe is without abnormal uptake and appears similar going back to a CT scan of 2022. Recommend continued surveillance. Scattered colonic diverticula.  Right-sided chest wall lipoma. Coronary artery calcifications. Please correlate for other coronary risk factors. Electronically Signed   By: Ranell Bring M.D.   On: 05/10/2024 17:27    ASSESSMENT AND PLAN: This is a very pleasant 63 years old white male with Stage IIa (T1c, N1, M0) non-small cell lung cancer likely squamous cell carcinoma but adenocarcinoma future could not be completely excluded. He presented with left lower lobe lung nodule in addition to left hilar adenopathy diagnosed in July 2025.  Assessment and Plan Assessment & Plan Stage 2 non-small cell lung cancer, likely squamous cell carcinoma Recently diagnosed in July 2025. The standard of care is neoadjuvant systemic chemoimmunotherapy to shrink the tumor and improve surgical outcomes. - Administer three cycles of carboplatin for AUC of 6, paclitaxel 200 Mg/M2, and nivolumab 360 Mg IV, one day every three weeks. - Administer Neulasta injection two days after each chemotherapy session to boost white blood cell count. - Provide anti-nausea medication prescription to pharmacy. - Schedule chemotherapy education class prior to treatment initiation. - Plan follow-up appointment one week after the first treatment cycle to assess tolerance. - Coordinate with cancer center for appointment scheduling and communication. The patient was advised to call immediately if he has any concerning symptoms in the interval. The patient voices understanding of current disease status and treatment options and is in  agreement with the current care plan.  All questions were answered. The patient knows to call the clinic with any problems, questions or concerns. We can certainly see the patient much sooner if necessary.  The total time spent in the appointment was 30 minutes including review of chart and various tests results, discussions about plan of care and coordination of care plan .  Disclaimer: This note was dictated with voice recognition software. Similar sounding words can inadvertently be transcribed and may not be corrected upon review.

## 2024-06-06 NOTE — Progress Notes (Signed)
 START ON PATHWAY REGIMEN - Non-Small Cell Lung     A cycle is every 21 days:     Nivolumab      Paclitaxel      Carboplatin   **Always confirm dose/schedule in your pharmacy ordering system**  Patient Characteristics: Preoperative or Nonsurgical Candidate (Clinical Staging), Stage IIA or Stage IIB (N0-1 only), Surgical Candidate, EGFR Negative and ALK Negative, AND Neoadjuvant-only Chemoimmunotherapy Preferred, Squamous Cell Therapeutic Status: Preoperative or Nonsurgical Candidate (Clinical Staging) AJCC T Category: cT1c AJCC N Category: cN1 AJCC M Category: cM0 AJCC 9 Stage Grouping: IIA Check here if patient was staged using an edition other than AJCC Staging 9th Edition: false ALK Fusion/Rearrangement Status: Negative Preferred Treatment: Neoadjuvant-only Chemoimmunotherapy Preferred EGFR Mutation Status: Negative/Wild Type Histology: Squamous Cell Intent of Therapy: Curative Intent, Discussed with Patient

## 2024-06-07 ENCOUNTER — Other Ambulatory Visit: Payer: Self-pay

## 2024-06-07 ENCOUNTER — Other Ambulatory Visit: Payer: Self-pay | Admitting: Physician Assistant

## 2024-06-07 ENCOUNTER — Telehealth: Payer: Self-pay | Admitting: Internal Medicine

## 2024-06-07 NOTE — Telephone Encounter (Signed)
 Scheduled and confirmed appointments with he patient.

## 2024-06-10 ENCOUNTER — Inpatient Hospital Stay

## 2024-06-10 ENCOUNTER — Encounter: Payer: Self-pay | Admitting: Internal Medicine

## 2024-06-10 DIAGNOSIS — C3432 Malignant neoplasm of lower lobe, left bronchus or lung: Secondary | ICD-10-CM

## 2024-06-10 LAB — CBC WITH DIFFERENTIAL (CANCER CENTER ONLY)
Abs Immature Granulocytes: 0.02 K/uL (ref 0.00–0.07)
Basophils Absolute: 0.1 K/uL (ref 0.0–0.1)
Basophils Relative: 1 %
Eosinophils Absolute: 0.2 K/uL (ref 0.0–0.5)
Eosinophils Relative: 2 %
HCT: 42.8 % (ref 39.0–52.0)
Hemoglobin: 15.5 g/dL (ref 13.0–17.0)
Immature Granulocytes: 0 %
Lymphocytes Relative: 24 %
Lymphs Abs: 2.1 K/uL (ref 0.7–4.0)
MCH: 31.5 pg (ref 26.0–34.0)
MCHC: 36.2 g/dL — ABNORMAL HIGH (ref 30.0–36.0)
MCV: 87 fL (ref 80.0–100.0)
Monocytes Absolute: 0.6 K/uL (ref 0.1–1.0)
Monocytes Relative: 7 %
Neutro Abs: 5.6 K/uL (ref 1.7–7.7)
Neutrophils Relative %: 66 %
Platelet Count: 271 K/uL (ref 150–400)
RBC: 4.92 MIL/uL (ref 4.22–5.81)
RDW: 12.8 % (ref 11.5–15.5)
WBC Count: 8.6 K/uL (ref 4.0–10.5)
nRBC: 0 % (ref 0.0–0.2)

## 2024-06-10 LAB — CMP (CANCER CENTER ONLY)
ALT: 28 U/L (ref 0–44)
AST: 20 U/L (ref 15–41)
Albumin: 4.4 g/dL (ref 3.5–5.0)
Alkaline Phosphatase: 60 U/L (ref 38–126)
Anion gap: 9 (ref 5–15)
BUN: 18 mg/dL (ref 8–23)
CO2: 29 mmol/L (ref 22–32)
Calcium: 10 mg/dL (ref 8.9–10.3)
Chloride: 101 mmol/L (ref 98–111)
Creatinine: 0.8 mg/dL (ref 0.61–1.24)
GFR, Estimated: >60 mL/min (ref >60)
Glucose, Bld: 124 mg/dL — ABNORMAL HIGH (ref 70–99)
Potassium: 3.7 mmol/L (ref 3.5–5.1)
Sodium: 139 mmol/L (ref 135–145)
Total Bilirubin: 0.5 mg/dL (ref 0.0–1.2)
Total Protein: 7 g/dL (ref 6.5–8.1)

## 2024-06-10 LAB — TSH: TSH: 2.7 u[IU]/mL (ref 0.350–4.500)

## 2024-06-10 NOTE — Progress Notes (Signed)
 The following biosimilar Udenyca  (pegfilgrastim -cbqv) has been selected for use in this patient.  Nathan [name], Pharm.D., CPP 06/10/2024@2 :27 PM

## 2024-06-11 ENCOUNTER — Inpatient Hospital Stay

## 2024-06-11 VITALS — BP 111/59 | HR 71 | Temp 98.1°F | Resp 16 | Wt 162.5 lb

## 2024-06-11 DIAGNOSIS — C3432 Malignant neoplasm of lower lobe, left bronchus or lung: Secondary | ICD-10-CM

## 2024-06-11 LAB — T4: T4, Total: 6.7 ug/dL (ref 4.5–12.0)

## 2024-06-11 MED ORDER — SODIUM CHLORIDE 0.9 % IV SOLN
729.0000 mg | Freq: Once | INTRAVENOUS | Status: AC
  Administered 2024-06-11: 730 mg via INTRAVENOUS
  Filled 2024-06-11: qty 73

## 2024-06-11 MED ORDER — DIPHENHYDRAMINE HCL 50 MG/ML IJ SOLN
50.0000 mg | Freq: Once | INTRAMUSCULAR | Status: AC
  Administered 2024-06-11: 50 mg via INTRAVENOUS
  Filled 2024-06-11: qty 1

## 2024-06-11 MED ORDER — FAMOTIDINE IN NACL 20-0.9 MG/50ML-% IV SOLN
20.0000 mg | Freq: Once | INTRAVENOUS | Status: AC
  Administered 2024-06-11: 20 mg via INTRAVENOUS
  Filled 2024-06-11: qty 50

## 2024-06-11 MED ORDER — SODIUM CHLORIDE 0.9 % IV SOLN
200.0000 mg/m2 | Freq: Once | INTRAVENOUS | Status: AC
  Administered 2024-06-11: 372 mg via INTRAVENOUS
  Filled 2024-06-11: qty 62

## 2024-06-11 MED ORDER — SODIUM CHLORIDE 0.9 % IV SOLN
INTRAVENOUS | Status: DC
Start: 2024-06-11 — End: 2024-06-11

## 2024-06-11 MED ORDER — PALONOSETRON HCL INJECTION 0.25 MG/5ML
0.2500 mg | Freq: Once | INTRAVENOUS | Status: AC
  Administered 2024-06-11: 0.25 mg via INTRAVENOUS
  Filled 2024-06-11: qty 5

## 2024-06-11 MED ORDER — SODIUM CHLORIDE 0.9 % IV SOLN
150.0000 mg | Freq: Once | INTRAVENOUS | Status: AC
  Administered 2024-06-11: 150 mg via INTRAVENOUS
  Filled 2024-06-11: qty 150

## 2024-06-11 MED ORDER — SODIUM CHLORIDE 0.9 % IV SOLN
360.0000 mg | Freq: Once | INTRAVENOUS | Status: AC
  Administered 2024-06-11: 360 mg via INTRAVENOUS
  Filled 2024-06-11: qty 12

## 2024-06-11 MED ORDER — DEXAMETHASONE SODIUM PHOSPHATE 10 MG/ML IJ SOLN
10.0000 mg | Freq: Once | INTRAMUSCULAR | Status: AC
  Administered 2024-06-11: 10 mg via INTRAVENOUS
  Filled 2024-06-11: qty 1

## 2024-06-11 NOTE — Patient Instructions (Signed)
 [facility] - A DEPT OF [facility]  Discharge Instructions: Thank you for choosing [facility] to provide your oncology and hematology care.   If you have a lab appointment with the Cancer Center, please go directly to the Cancer Center and check in at the registration area.   Wear comfortable clothing and clothing appropriate for easy access to any Portacath or PICC line.   We strive to give you quality time with your provider. You may need to reschedule your appointment if you arrive late (15 or more minutes).  Arriving late affects you and other patients whose appointments are after yours.  Also, if you miss three or more appointments without notifying the office, you may be dismissed from the clinic at the provider's discretion.      For prescription refill requests, have your pharmacy contact our office and allow 72 hours for refills to be completed.    Today you received the following chemotherapy and/or immunotherapy agents: Opdivo , Taxol , Carboplatin       To help prevent nausea and vomiting after your treatment, we encourage you to take your nausea medication as directed.  BELOW ARE SYMPTOMS THAT SHOULD BE REPORTED IMMEDIATELY: *FEVER GREATER THAN 100.4 F (38 C) OR HIGHER *CHILLS OR SWEATING *NAUSEA AND VOMITING THAT IS NOT CONTROLLED WITH YOUR NAUSEA MEDICATION *UNUSUAL SHORTNESS OF BREATH *UNUSUAL BRUISING OR BLEEDING *URINARY PROBLEMS (pain or burning when urinating, or frequent urination) *BOWEL PROBLEMS (unusual diarrhea, constipation, pain near the anus) TENDERNESS IN MOUTH AND THROAT WITH OR WITHOUT PRESENCE OF ULCERS (sore throat, sores in mouth, or a toothache) UNUSUAL RASH, SWELLING OR PAIN  UNUSUAL VAGINAL DISCHARGE OR ITCHING   Items with * indicate a potential emergency and should be followed up as soon as possible or go to the Emergency Department if any problems should occur.  Please show the CHEMOTHERAPY ALERT CARD  or IMMUNOTHERAPY ALERT CARD at check-in to the Emergency Department and triage nurse.  Should you have questions after your visit or need to cancel or reschedule your appointment, please contact [facility] - A DEPT OF [facility]  Dept: [phone]  and follow the prompts.  Office hours are 8:00 a.m. to 4:30 p.m. Monday - Friday. Please note that voicemails left after 4:00 p.m. may not be returned until the following business day.  We are closed weekends and major holidays. You have access to a nurse at all times for urgent questions. Please call the main number to the clinic Dept: [phone] and follow the prompts.   For any non-urgent questions, you may also contact your provider using MyChart. We now offer e-Visits for anyone 52 and older to request care online for non-urgent symptoms. For details visit [email or web address].   Also download the MyChart app! Go to the app store, search MyChart, open the app, select [facility], and log in with your MyChart username and password.

## 2024-06-12 ENCOUNTER — Other Ambulatory Visit

## 2024-06-12 ENCOUNTER — Telehealth: Payer: Self-pay

## 2024-06-12 NOTE — Telephone Encounter (Signed)
-----   Message from Nurse Cortney P sent at 06/11/2024  3:36 PM EDT ----- Regarding: Dr Sherrod, first time Opdivo /Taxol Nathan [name] Dr Sherrod patient, first time Opdivo , Taxol , Carbo. Patient tolerated treatment well, no complaints.

## 2024-06-12 NOTE — Telephone Encounter (Signed)
 Nathan [name] states that he is doing fine. He is eating, drinking, and urinating well. He knows to call the office at [phone] if he has any questions or concerns.

## 2024-06-13 ENCOUNTER — Inpatient Hospital Stay

## 2024-06-13 VITALS — BP 113/63 | HR 62 | Temp 97.7°F | Resp 16

## 2024-06-13 DIAGNOSIS — C3432 Malignant neoplasm of lower lobe, left bronchus or lung: Secondary | ICD-10-CM | POA: Diagnosis not present

## 2024-06-13 MED ORDER — PEGFILGRASTIM-CBQV 6 MG/0.6ML ~~LOC~~ SOSY
6.0000 mg | PREFILLED_SYRINGE | Freq: Once | SUBCUTANEOUS | Status: AC
  Administered 2024-06-13: 6 mg via SUBCUTANEOUS
  Filled 2024-06-13: qty 0.6

## 2024-06-15 NOTE — Progress Notes (Unsigned)
 [facility] OFFICE PROGRESS NOTE  Nathan Rush, MD [address] [place] [address]  DIAGNOSIS: Stage IIa (T1c, N1, M0) non-small cell lung cancer likely squamous cell carcinoma but adenocarcinoma future could not be completely excluded. He presented with left lower lobe lung nodule in addition to left hilar adenopathy diagnosed in July 2025.   PRIOR THERAPY: None  CURRENT THERAPY: Neoadjuvant systemic chemoimmunotherapy with carboplatin  for AUC of 6, paclitaxel  200 Mg/M2 and nivolumab  360 Mg IV every 3 weeks with Neulasta  support.  First dose 06/13/2024. Status post 1 cycle.     INTERVAL HISTORY: Nathan [name] 63 y.o. male returns to the clinic for a follow up visit accompanied by his daughter. The patient is feeling well today without any concerning complaints. The patient was recently diagnosed with lung cancer. He is undergoing neoadjuvant treatment. He underwent his first cycle of treatment last week and tolerated it well except for some restless legs for a few days. He also has itching on his legs but is not sure if it is from the treatment or the fact he started using new soap/shampoo. He already taking Claritin daily.  Denies any fever, chills, night sweats, or weight loss. Denies any chest pain, significant cough, or hemoptysis. He has intermittent shortness of breath that comes and goes. He is trying to cut back/quit smoking. He smokes 2-3 cigarettes per day. Denies any nausea, vomiting, diarrhea, or constipation. He has had some loose stool but not diarrhea. Denies any headache or visual changes. They are wondering if he can take melatonin and a multivitamin. The patient is here today for evaluation and repeat blood work and to manage any adverse side effects of treatment.    MEDICAL HISTORY: Past Medical History:  Diagnosis Date   Anxiety    pt denies   CAD (coronary artery disease)    Diabetes mellitus    Hepatic steatosis    Hypercholesteremia     Hypertension    Myocardial infarction (HCC) 10/18/2003    ALLERGIES:  has no known allergies.  MEDICATIONS:  Current Outpatient Medications  Medication Sig Dispense Refill   albuterol  (VENTOLIN  HFA) 108 (90 Base) MCG/ACT inhaler Inhale 2 puffs into the lungs every 6 (six) hours as needed for wheezing or shortness of breath. 8 g 0   acetaminophen  (TYLENOL ) 500 MG tablet Take 1,000 mg by mouth as needed. For pain     aspirin EC 81 MG tablet Take 81 mg by mouth at bedtime.     augmented betamethasone dipropionate (DIPROLENE-AF) 0.05 % cream Apply 1 application. topically 2 (two) times daily as needed (skin itching/irritation.).     clopidogrel  (PLAVIX ) 75 MG tablet TAKE 1 TABLET BY MOUTH EVERY DAY (Patient taking differently: Take 75 mg by mouth at bedtime.) 90 tablet 3   doxycycline  (VIBRA -TABS) 100 MG tablet Take 1 tablet (100 mg total) by mouth 2 (two) times daily. 14 tablet 0   hydrochlorothiazide (HYDRODIURIL) 25 MG tablet Take 25 mg by mouth in the morning.     metFORMIN (GLUCOPHAGE) 1000 MG tablet Take 1,000 mg by mouth 2 (two) times daily.     metoprolol (LOPRESSOR) 50 MG tablet Take 50 mg by mouth 2 (two) times daily.      nitroGLYCERIN  (NITROSTAT ) 0.4 MG SL tablet Place 1 tablet (0.4 mg total) under the tongue every 5 (five) minutes as needed for chest pain. Max 3 dose 25 tablet 3   olmesartan  (BENICAR ) 40 MG tablet TAKE 1 TABLET (40 MG TOTAL) BY MOUTH  IN THE MORNING 90 tablet 3   ondansetron  (ZOFRAN ) 8 MG tablet Take 1 tablet (8 mg total) by mouth every 8 (eight) hours as needed for nausea or vomiting. Start on the third day after chemotherapy. 30 tablet 1   prochlorperazine  (COMPAZINE ) 10 MG tablet Take 1 tablet (10 mg total) by mouth every 6 (six) hours as needed for nausea or vomiting. 30 tablet 1   rosuvastatin  (CRESTOR ) 10 MG tablet Take 1 tablet (10 mg total) by mouth daily. 90 tablet 3   No current facility-administered medications for this visit.    SURGICAL HISTORY:  Past  Surgical History:  Procedure Laterality Date   APPENDECTOMY     CARDIAC CATHETERIZATION  01/25/2004   Cypher DES (3.5x65mm) for PPMI to RCA (Dr. IVAR Sor)   CARDIAC CATHETERIZATION  01/03/2005   patent stent with noncritical 30% Cfx disease and 30% LAD disease (Dr. DOROTHA Schwalbe)   COLONOSCOPY  09/06/2011   Procedure: COLONOSCOPY;  Surgeon: Oneil DELENA Budge;  [facility];  Service: Gastroenterology;  Laterality: N/A;   COLONOSCOPY WITH PROPOFOL  N/A 01/20/2022   Procedure: COLONOSCOPY WITH PROPOFOL ;  Surgeon: Shaaron Lamar HERO, MD;  Location: [facility];  Service: Endoscopy;  Laterality: N/A;  9:15am   NM MYOCAR PERF WALL MOTION  11/2010   bruce myoview ; normal pattern of perfusion in all regions, post-stress EF 62%; normal, low risk scan with improved perfusion from previous study   TRANSTHORACIC ECHOCARDIOGRAM  12/2011   EF=>55%, mild conc LVH; LA mildly dilated; trace MR & TR with normal RSVP; trace pulm valve regurg   VIDEO BRONCHOSCOPY WITH ENDOBRONCHIAL NAVIGATION Left 05/06/2024   Procedure: VIDEO BRONCHOSCOPY WITH ENDOBRONCHIAL NAVIGATION;  Surgeon: [name], MD;  Location: [facility];  Service: Pulmonary;  Laterality: Left;    REVIEW OF SYSTEMS:   Review of Systems  Constitutional: Negative for appetite change, chills, fatigue, fever and unexpected weight change.  HENT: Negative for mouth sores, nosebleeds, sore throat and trouble swallowing.   Eyes: Negative for eye problems and icterus.  Respiratory: Intermittent shortness of breath. Negative for cough, hemoptysis,  and wheezing.   Cardiovascular: Negative for chest pain and leg swelling.  Gastrointestinal: Occasional loose stool. Negative for abdominal pain, constipation, nausea and vomiting.  Genitourinary: Negative for bladder incontinence, difficulty urinating, dysuria, frequency and hematuria.   Musculoskeletal: Negative for back pain, gait problem, neck pain and neck stiffness.  Skin: Positive for itching without  rash. Neurological: Negative for dizziness, extremity weakness, gait problem, headaches, light-headedness and seizures.  Hematological: Negative for adenopathy. Does not bruise/bleed easily.  Psychiatric/Behavioral: Negative for confusion, depression and sleep disturbance. The patient is not nervous/anxious.     PHYSICAL EXAMINATION:  Blood pressure 99/67, pulse 75, temperature 97.8 F (36.6 C), temperature source Temporal, resp. rate 13, weight 161 lb 14.4 oz (73.4 kg), SpO2 96%.  ECOG PERFORMANCE STATUS: 1  Physical Exam  Constitutional: Oriented to person, place, and time and well-developed, well-nourished, and in no distress.  HENT:  Head: Normocephalic and atraumatic.  Mouth/Throat: Oropharynx is clear and moist. No oropharyngeal exudate.  Eyes: Conjunctivae are normal. Right eye exhibits no discharge. Left eye exhibits no discharge. No scleral icterus.  Neck: Normal range of motion. Neck supple.  Cardiovascular: Normal rate, regular rhythm, normal heart sounds and intact distal pulses.   Pulmonary/Chest: Effort normal and breath sounds normal. No respiratory distress. No wheezes. No rales.  Abdominal: Soft. Bowel sounds are normal. Exhibits no distension and no mass. There is no tenderness.  Musculoskeletal: Normal range of motion. Exhibits no edema.  Lymphadenopathy:    No cervical adenopathy.  Neurological: Alert and oriented to person, place, and time. Exhibits normal muscle tone. Gait normal. Coordination normal.  Skin: Skin is warm and dry. No rash noted. Not diaphoretic. No erythema. No pallor.  Psychiatric: Mood, memory and judgment normal.  Vitals reviewed.  LABORATORY DATA: Lab Results  Component Value Date   WBC 24.7 (H) 06/20/2024   HGB 14.5 06/20/2024   HCT 39.9 06/20/2024   MCV 87.7 06/20/2024   PLT 204 06/20/2024      Chemistry      Component Value Date/Time   NA 138 06/20/2024 0725   K 4.4 06/20/2024 0725   CL 101 06/20/2024 0725   CO2 29 06/20/2024  0725   BUN 23 06/20/2024 0725   CREATININE 0.95 06/20/2024 0725   CREATININE 0.83 05/18/2013 1055      Component Value Date/Time   CALCIUM  9.8 06/20/2024 0725   ALKPHOS 93 06/20/2024 0725   AST 20 06/20/2024 0725   ALT 31 06/20/2024 0725   BILITOT 0.2 06/20/2024 0725       RADIOGRAPHIC STUDIES:  No results found.    ASSESSMENT/PLAN:  This is a very pleasant 63 year old Caucasian male with Stage IIa (T1c, N1, M0) non-small cell lung cancer likely squamous cell carcinoma but adenocarcinoma future could not be completely excluded. He presented with left lower lobe lung nodule in addition to left hilar adenopathy diagnosed in July 2025.   He is currently undergoing neoadjuvant chemo/immunotherapy with  carboplatin  for AUC of 6, paclitaxel  200 Mg/M2, and nivolumab  360 Mg IV, one day every three weeks. The plan is to undergo 3 cycles of treatment. He is status post 1 cycle.   He tolerated it well overall.   Labs were reviewed. Recommend he continue on the same treatment at the same dose.   We will see him back in 2 weeks for evaluation and repeat blood work before undergoing cycle #2.   Pruritus associated with immunotherapy Itching likely related to immunotherapy, no rash present. Possible exacerbation by new soap or shampoo. - Revert to previous soap and shampoo to rule out contact dermatitis. - Take non-sedating antihistamines like Claritin, Zyrtec, or Allegra. - Apply hydrocortisone cream to localized itchy areas if needed.   Shortness of breath Intermittent shortness of breath, not linked to exertion or other symptoms. Smoking cessation may improve symptoms. - Prescribe rescue inhaler for episodes. - Encourage continued smoking cessation efforts. - Can take mucinex for phlegm  He can take melatonin for sleep aid.   Loose stools Occasional loose stools, not classified as diarrhea. - Advise use of Imodium if stools become persistently loose or diarrhea develops.  The  patient was advised to call immediately if he has any concerning symptoms in the interval. The patient voices understanding of current disease status and treatment options and is in agreement with the current care plan. All questions were answered. The patient knows to call the clinic with any problems, questions or concerns. We can certainly see the patient much sooner if necessary       No orders of the defined types were placed in this encounter.    The total time spent in the appointment was 20-29 minutes  Karigan Cloninger L Shaka Cardin, PA-C 06/20/24

## 2024-06-20 ENCOUNTER — Inpatient Hospital Stay (HOSPITAL_BASED_OUTPATIENT_CLINIC_OR_DEPARTMENT_OTHER): Admitting: Physician Assistant

## 2024-06-20 ENCOUNTER — Inpatient Hospital Stay: Attending: Internal Medicine

## 2024-06-20 ENCOUNTER — Encounter: Payer: Self-pay | Admitting: Internal Medicine

## 2024-06-20 ENCOUNTER — Telehealth: Payer: Self-pay

## 2024-06-20 VITALS — BP 99/67 | HR 75 | Temp 97.8°F | Resp 13 | Wt 161.9 lb

## 2024-06-20 DIAGNOSIS — I252 Old myocardial infarction: Secondary | ICD-10-CM | POA: Diagnosis not present

## 2024-06-20 DIAGNOSIS — E7800 Pure hypercholesterolemia, unspecified: Secondary | ICD-10-CM | POA: Insufficient documentation

## 2024-06-20 DIAGNOSIS — R0602 Shortness of breath: Secondary | ICD-10-CM | POA: Diagnosis not present

## 2024-06-20 DIAGNOSIS — Z79899 Other long term (current) drug therapy: Secondary | ICD-10-CM | POA: Diagnosis not present

## 2024-06-20 DIAGNOSIS — E119 Type 2 diabetes mellitus without complications: Secondary | ICD-10-CM | POA: Insufficient documentation

## 2024-06-20 DIAGNOSIS — Z5112 Encounter for antineoplastic immunotherapy: Secondary | ICD-10-CM | POA: Insufficient documentation

## 2024-06-20 DIAGNOSIS — F17210 Nicotine dependence, cigarettes, uncomplicated: Secondary | ICD-10-CM | POA: Diagnosis not present

## 2024-06-20 DIAGNOSIS — C3432 Malignant neoplasm of lower lobe, left bronchus or lung: Secondary | ICD-10-CM | POA: Insufficient documentation

## 2024-06-20 DIAGNOSIS — Z809 Family history of malignant neoplasm, unspecified: Secondary | ICD-10-CM | POA: Insufficient documentation

## 2024-06-20 DIAGNOSIS — Z7984 Long term (current) use of oral hypoglycemic drugs: Secondary | ICD-10-CM | POA: Insufficient documentation

## 2024-06-20 DIAGNOSIS — Z5111 Encounter for antineoplastic chemotherapy: Secondary | ICD-10-CM | POA: Insufficient documentation

## 2024-06-20 DIAGNOSIS — Z5189 Encounter for other specified aftercare: Secondary | ICD-10-CM | POA: Insufficient documentation

## 2024-06-20 DIAGNOSIS — Z7982 Long term (current) use of aspirin: Secondary | ICD-10-CM | POA: Diagnosis not present

## 2024-06-20 DIAGNOSIS — I2510 Atherosclerotic heart disease of native coronary artery without angina pectoris: Secondary | ICD-10-CM | POA: Insufficient documentation

## 2024-06-20 DIAGNOSIS — R21 Rash and other nonspecific skin eruption: Secondary | ICD-10-CM | POA: Insufficient documentation

## 2024-06-20 DIAGNOSIS — R197 Diarrhea, unspecified: Secondary | ICD-10-CM | POA: Diagnosis not present

## 2024-06-20 DIAGNOSIS — I10 Essential (primary) hypertension: Secondary | ICD-10-CM | POA: Diagnosis not present

## 2024-06-20 DIAGNOSIS — L299 Pruritus, unspecified: Secondary | ICD-10-CM | POA: Insufficient documentation

## 2024-06-20 DIAGNOSIS — I493 Ventricular premature depolarization: Secondary | ICD-10-CM | POA: Diagnosis not present

## 2024-06-20 DIAGNOSIS — K760 Fatty (change of) liver, not elsewhere classified: Secondary | ICD-10-CM | POA: Insufficient documentation

## 2024-06-20 LAB — CBC WITH DIFFERENTIAL (CANCER CENTER ONLY)
Abs Immature Granulocytes: 2.28 K/uL — ABNORMAL HIGH (ref 0.00–0.07)
Basophils Absolute: 0.1 K/uL (ref 0.0–0.1)
Basophils Relative: 0 %
Eosinophils Absolute: 0.2 K/uL (ref 0.0–0.5)
Eosinophils Relative: 1 %
HCT: 39.9 % (ref 39.0–52.0)
Hemoglobin: 14.5 g/dL (ref 13.0–17.0)
Immature Granulocytes: 9 %
Lymphocytes Relative: 10 %
Lymphs Abs: 2.5 K/uL (ref 0.7–4.0)
MCH: 31.9 pg (ref 26.0–34.0)
MCHC: 36.3 g/dL — ABNORMAL HIGH (ref 30.0–36.0)
MCV: 87.7 fL (ref 80.0–100.0)
Monocytes Absolute: 2.6 K/uL — ABNORMAL HIGH (ref 0.1–1.0)
Monocytes Relative: 11 %
Neutro Abs: 17 K/uL — ABNORMAL HIGH (ref 1.7–7.7)
Neutrophils Relative %: 69 %
Platelet Count: 204 K/uL (ref 150–400)
RBC: 4.55 MIL/uL (ref 4.22–5.81)
RDW: 12.4 % (ref 11.5–15.5)
Smear Review: NORMAL
WBC Count: 24.7 K/uL — ABNORMAL HIGH (ref 4.0–10.5)
nRBC: 0 % (ref 0.0–0.2)

## 2024-06-20 LAB — CMP (CANCER CENTER ONLY)
ALT: 31 U/L (ref 0–44)
AST: 20 U/L (ref 15–41)
Albumin: 3.8 g/dL (ref 3.5–5.0)
Alkaline Phosphatase: 93 U/L (ref 38–126)
Anion gap: 8 (ref 5–15)
BUN: 23 mg/dL (ref 8–23)
CO2: 29 mmol/L (ref 22–32)
Calcium: 9.8 mg/dL (ref 8.9–10.3)
Chloride: 101 mmol/L (ref 98–111)
Creatinine: 0.95 mg/dL (ref 0.61–1.24)
GFR, Estimated: >60 mL/min (ref >60)
Glucose, Bld: 195 mg/dL — ABNORMAL HIGH (ref 70–99)
Potassium: 4.4 mmol/L (ref 3.5–5.1)
Sodium: 138 mmol/L (ref 135–145)
Total Bilirubin: 0.2 mg/dL (ref 0.0–1.2)
Total Protein: 6.6 g/dL (ref 6.5–8.1)

## 2024-06-20 MED ORDER — ALBUTEROL SULFATE HFA 108 (90 BASE) MCG/ACT IN AERS: 2.0000 | INHALATION_SPRAY | Freq: Four times a day (QID) | RESPIRATORY_TRACT | 0 refills | Status: AC | PRN

## 2024-06-20 NOTE — Telephone Encounter (Signed)
 Spoke with [employer] inquiring whether patient had surgery on 06/10/24. Informed representative that, based on available information, there is no indication of surgery on this date. Representative acknowledged and voiced understanding of the situation.

## 2024-06-21 ENCOUNTER — Telehealth: Payer: Self-pay

## 2024-06-21 ENCOUNTER — Telehealth: Payer: Self-pay | Admitting: Physician Assistant

## 2024-06-21 NOTE — Telephone Encounter (Signed)
 Notified the pt regarding his FMLA/Disability form being competed, faxed, confirmation received. I let the pt know we had one more form that needs MD signature, due to it got over looked. Pt verbalized understanding. Pt will pick up hardcopy.

## 2024-06-21 NOTE — Telephone Encounter (Signed)
 Rescheduled appointments per 9/4 los. Talked with the patient and he is aware of the changes made to his upcoming appointments.

## 2024-06-25 ENCOUNTER — Telehealth: Payer: Self-pay

## 2024-06-25 NOTE — Telephone Encounter (Signed)
 Notified the pt regarding his disability form being completed, faxed, with confirmation received. Pt verbalized understanding. He pickup his hard copy for his records/ No questions or concerns to be noted at this time.

## 2024-06-26 ENCOUNTER — Telehealth: Payer: Self-pay | Admitting: *Deleted

## 2024-06-26 NOTE — Telephone Encounter (Signed)
 Pt states he developed a rash on his arms on Friday/Saturday. Worked in his yard last week and he thought maybe it was poison ivy or poison ivy. His itching is worse. He has used hydrocortisone  cream, calamine lotion and took benadryl  last night. It has not improved. Will be here tomorrow at 1:45 for labs.

## 2024-06-26 NOTE — Telephone Encounter (Signed)
 Pt will be coming to [facility] after labs tomorrow

## 2024-06-27 ENCOUNTER — Inpatient Hospital Stay

## 2024-06-27 ENCOUNTER — Other Ambulatory Visit: Payer: Self-pay

## 2024-06-27 ENCOUNTER — Encounter: Payer: Self-pay | Admitting: Internal Medicine

## 2024-06-27 ENCOUNTER — Inpatient Hospital Stay (HOSPITAL_BASED_OUTPATIENT_CLINIC_OR_DEPARTMENT_OTHER): Admitting: Physician Assistant

## 2024-06-27 VITALS — BP 94/61 | Temp 97.4°F | Resp 16 | Ht 66.0 in | Wt 162.0 lb

## 2024-06-27 DIAGNOSIS — C3432 Malignant neoplasm of lower lobe, left bronchus or lung: Secondary | ICD-10-CM

## 2024-06-27 DIAGNOSIS — R21 Rash and other nonspecific skin eruption: Secondary | ICD-10-CM

## 2024-06-27 LAB — CBC WITH DIFFERENTIAL (CANCER CENTER ONLY)
Abs Immature Granulocytes: 0.21 K/uL — ABNORMAL HIGH (ref 0.00–0.07)
Basophils Absolute: 0.2 K/uL — ABNORMAL HIGH (ref 0.0–0.1)
Basophils Relative: 1 %
Eosinophils Absolute: 0.1 K/uL (ref 0.0–0.5)
Eosinophils Relative: 1 %
HCT: 39.5 % (ref 39.0–52.0)
Hemoglobin: 14.3 g/dL (ref 13.0–17.0)
Immature Granulocytes: 2 %
Lymphocytes Relative: 15 %
Lymphs Abs: 2 K/uL (ref 0.7–4.0)
MCH: 31.6 pg (ref 26.0–34.0)
MCHC: 36.2 g/dL — ABNORMAL HIGH (ref 30.0–36.0)
MCV: 87.4 fL (ref 80.0–100.0)
Monocytes Absolute: 1.3 K/uL — ABNORMAL HIGH (ref 0.1–1.0)
Monocytes Relative: 10 %
Neutro Abs: 9.7 K/uL — ABNORMAL HIGH (ref 1.7–7.7)
Neutrophils Relative %: 71 %
Platelet Count: 151 K/uL (ref 150–400)
RBC: 4.52 MIL/uL (ref 4.22–5.81)
RDW: 12.4 % (ref 11.5–15.5)
WBC Count: 13.5 K/uL — ABNORMAL HIGH (ref 4.0–10.5)
nRBC: 0 % (ref 0.0–0.2)

## 2024-06-27 LAB — CMP (CANCER CENTER ONLY)
ALT: 50 U/L — ABNORMAL HIGH (ref 0–44)
AST: 24 U/L (ref 15–41)
Albumin: 4.1 g/dL (ref 3.5–5.0)
Alkaline Phosphatase: 93 U/L (ref 38–126)
Anion gap: 7 (ref 5–15)
BUN: 12 mg/dL (ref 8–23)
CO2: 29 mmol/L (ref 22–32)
Calcium: 9.6 mg/dL (ref 8.9–10.3)
Chloride: 100 mmol/L (ref 98–111)
Creatinine: 0.95 mg/dL (ref 0.61–1.24)
GFR, Estimated: >60 mL/min (ref >60)
Glucose, Bld: 145 mg/dL — ABNORMAL HIGH (ref 70–99)
Potassium: 4.3 mmol/L (ref 3.5–5.1)
Sodium: 136 mmol/L (ref 135–145)
Total Bilirubin: 0.3 mg/dL (ref 0.0–1.2)
Total Protein: 6.9 g/dL (ref 6.5–8.1)

## 2024-06-27 MED ORDER — HYDROCORTISONE 1 % EX LOTN: 1.0000 | TOPICAL_LOTION | Freq: Two times a day (BID) | CUTANEOUS | 0 refills | Status: AC

## 2024-06-27 NOTE — Patient Instructions (Signed)
 Take zyrtec as directed on the bottle for 5 days to help with the rash and itching.  Also take Pepcid  20 mg twice daily for 5 days to help with rash and itching.

## 2024-06-27 NOTE — Progress Notes (Signed)
 Symptom Management Consult Note [facility]    Patient Care Team: [name], MD as PCP - General (Family Medicine) Mona Vinie BROCKS, MD as PCP - Cardiology (Cardiology)    Name / MRN / DOB: Nathan [name]  [id number]  12-08-1960   Date of visit: 06/27/2024   Chief Complaint/Reason for visit: Rash   Current Therapy: Neoadjuvant systemic chemoimmunotherapy with carboplatin  for AUC of 6, paclitaxel  200 Mg/M2 and nivolumab  360 Mg IV every 3 weeks with Neulasta  support   Last treatment:  Day 1   Cycle 1 on 06/11/24. GCSF on 06/13/24    ASSESSMENT AND PLAN Patient is a 63 y.o. male with oncologic history of stage IIa (T1c, N1, M0) non-small cell lung cancer followed by Dr. Sherrod.  I have viewed most recent oncology note and lab work.  #Stage IIa (T1c, N1, M0) non-small cell lung cancer  - Here for scheduled toxicity check today, 1 week post first treatment - Next appointment with oncologist is 07/02/24   #Rash - Rash likely drug-induced from Opdivo . Benadryl  provided some relief. Rash not consistent with SJS or TEN. - Prescribe hydrocortisone  ointment for forearms. - Advise switching Benadryl  to PO Zyrtec Recommend Pepcid  for allergic reaction.He should try these for 5 days consistently. - Instruct to avoid steroid ointment on face and limit sun exposure. - Advise to contact clinic if rash worsens.  Dr. Sherrod agrees with plan. Trying to hold off on systemic steroids for now as he is on immunotherapy.  #Hypertension - Patient with history of hypertension on multiple medications.  Blood pressure today in clinic was soft 96/44 with heart rate of 30 per nurse tech doing patient intake.  EKG was obtained and shows rate of 76 sinus rhythm with frequent PVCs.  Patient is currently asymptomatic.  Encouraged him to check blood pressure daily and follow-up with cardiologist if pressures remain soft.  Strict ED precautions discussed should symptoms  worsen.   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    Nathan [name] is a 63 y.o. male with oncologic history as above presenting to [facility] today with chief complaint of rash. Patient is accompanied by family member who provides additional history.  He developed a rash over the weekend, approximately 5 days ago, primarily on his arms, which was intensely pruritic. He initially suspected poison oak and used Caladryl lotion without relief. He began taking Benadryl  two days ago, which significantly reduced the itching. He takes Benadryl  sporadically, mainly at night, and noted it helps calm the itching. He also tried OTC hydrocortisone  ointment, but it is not sure if it was more effective than Caladryl.  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 He typically wears T-shirts while working outside and does not recall  any specific exposure that might have caused the rash.   ROS  All other systems are reviewed and are negative for acute change except as noted in the HPI.    No Known Allergies   Past Medical History:  Diagnosis Date   Anxiety    pt denies   CAD (coronary artery disease)    Diabetes mellitus    Hepatic steatosis    Hypercholesteremia    Hypertension    Myocardial infarction (HCC) 10/18/2003     Past Surgical  History:  Procedure Laterality Date   APPENDECTOMY     CARDIAC CATHETERIZATION  01/25/2004   Cypher DES (3.5x60mm) for PPMI to RCA (Dr. IVAR Sor)   CARDIAC CATHETERIZATION  01/03/2005   patent stent with noncritical 30% Cfx disease and 30% LAD disease (Dr. DOROTHA Schwalbe)   COLONOSCOPY  09/06/2011   Procedure: COLONOSCOPY;  Surgeon: Oneil DELENA Budge;  [facility];  Service: Gastroenterology;  Laterality: N/A;   COLONOSCOPY WITH PROPOFOL  N/A 01/20/2022   Procedure: COLONOSCOPY WITH PROPOFOL ;  Surgeon: Shaaron Lamar HERO, MD;  [facility];  Service: Endoscopy;  Laterality: N/A;  9:15am   NM MYOCAR PERF WALL MOTION  11/2010   bruce myoview ; normal pattern of perfusion in all regions, post-stress EF 62%; normal, low risk scan with improved perfusion from previous study   TRANSTHORACIC ECHOCARDIOGRAM  12/2011   EF=>55%, mild conc LVH; LA mildly dilated; trace MR & TR with normal RSVP; trace pulm valve regurg   VIDEO BRONCHOSCOPY WITH ENDOBRONCHIAL NAVIGATION Left 05/06/2024   Procedure: VIDEO BRONCHOSCOPY WITH ENDOBRONCHIAL NAVIGATION;  Surgeon: [name], MD;  Location: [facility];  Service: Pulmonary;  Laterality: Left;    Social History   Socioeconomic History   Marital status: Married    Spouse name: Not on file   Number of children: 4   Years of education: Not on file   Highest education level: Not on file  Occupational History   Occupation: truck Air traffic controller: [employer]  Tobacco Use   Smoking status: Every Day    Current packs/day: 1.50    Average packs/day: 1.5 packs/day for 35.0 years (52.5 ttl pk-yrs)    Types: Cigarettes   Smokeless tobacco: Current    Types: Snuff   Tobacco comments:    1 pack a day 04/29/2024 KRD  Vaping Use   Vaping status: Never Used  Substance and Sexual Activity   Alcohol use: Yes    Comment: occasionally, maybe once a month   Drug use: No   Sexual activity: Yes  Other Topics Concern   Not on file  Social  History Narrative   Not on file   Social Drivers of Health   Financial Resource Strain: Not on file  Food Insecurity: No Food Insecurity (05/20/2024)   Hunger Vital Sign    Worried About Running Out of Food in the Last Year: Never true    Ran Out of Food in the Last Year: Never true  Transportation Needs: No Transportation Needs (05/20/2024)   PRAPARE - Administrator, Civil Service (Medical): No    Lack of Transportation (Non-Medical): No  Physical Activity: Not on file  Stress: Not on file  Social Connections: Not on file  Intimate Partner Violence: Not At Risk (05/20/2024)   Humiliation, Afraid, Rape, and Kick questionnaire    Fear of Current or Ex-Partner: No    Emotionally Abused: No    Physically Abused: No  Sexually Abused: No    Family History  Problem Relation Age of Onset   Cancer Mother    Heart disease Mother    Cancer Sister    Heart disease Maternal Grandmother    Diabetes Paternal Grandmother    Sudden death Paternal Grandmother    Colon cancer Neg Hx      Current Outpatient Medications:    hydrocortisone  1 % lotion, Apply 1 Application topically 2 (two) times daily. Apply to rash on arms, Disp: 118 mL, Rfl: 0   acetaminophen  (TYLENOL ) 500 MG tablet, Take 1,000 mg by mouth as needed. For pain, Disp: , Rfl:    albuterol  (VENTOLIN  HFA) 108 (90 Base) MCG/ACT inhaler, Inhale 2 puffs into the lungs every 6 (six) hours as needed for wheezing or shortness of breath., Disp: 8 g, Rfl: 0   aspirin EC 81 MG tablet, Take 81 mg by mouth at bedtime., Disp: , Rfl:    augmented betamethasone dipropionate (DIPROLENE-AF) 0.05 % cream, Apply 1 application. topically 2 (two) times daily as needed (skin itching/irritation.)., Disp: , Rfl:    clopidogrel  (PLAVIX ) 75 MG tablet, TAKE 1 TABLET BY MOUTH EVERY DAY (Patient taking differently: Take 75 mg by mouth at bedtime.), Disp: 90 tablet, Rfl: 3   doxycycline  (VIBRA -TABS) 100 MG tablet, Take 1 tablet (100 mg total) by mouth  2 (two) times daily., Disp: 14 tablet, Rfl: 0   hydrochlorothiazide (HYDRODIURIL) 25 MG tablet, Take 25 mg by mouth in the morning., Disp: , Rfl:    metFORMIN (GLUCOPHAGE) 1000 MG tablet, Take 1,000 mg by mouth 2 (two) times daily., Disp: , Rfl:    metoprolol (LOPRESSOR) 50 MG tablet, Take 50 mg by mouth 2 (two) times daily. , Disp: , Rfl:    nitroGLYCERIN  (NITROSTAT ) 0.4 MG SL tablet, Place 1 tablet (0.4 mg total) under the tongue every 5 (five) minutes as needed for chest pain. Max 3 dose, Disp: 25 tablet, Rfl: 3   olmesartan  (BENICAR ) 40 MG tablet, TAKE 1 TABLET (40 MG TOTAL) BY MOUTH IN THE MORNING, Disp: 90 tablet, Rfl: 3   ondansetron  (ZOFRAN ) 8 MG tablet, Take 1 tablet (8 mg total) by mouth every 8 (eight) hours as needed for nausea or vomiting. Start on the third day after chemotherapy., Disp: 30 tablet, Rfl: 1   prochlorperazine  (COMPAZINE ) 10 MG tablet, Take 1 tablet (10 mg total) by mouth every 6 (six) hours as needed for nausea or vomiting., Disp: 30 tablet, Rfl: 1   rosuvastatin  (CRESTOR ) 10 MG tablet, Take 1 tablet (10 mg total) by mouth daily., Disp: 90 tablet, Rfl: 3  PHYSICAL EXAM ECOG FS:1 - Symptomatic but completely ambulatory    Vitals:   06/27/24 1406 06/27/24 1525  BP: (!) 96/44 94/61  Resp: 17 16  Temp: (!) 97.4 F (36.3 C)   TempSrc: Temporal   SpO2: 98%   Weight: 162 lb (73.5 kg)   Height: 5' 6 (1.676 m)    Physical Exam Vitals and nursing note reviewed.  Constitutional:      Appearance: He is not ill-appearing or toxic-appearing.  HENT:     Head: Normocephalic.  Eyes:     Conjunctiva/sclera: Conjunctivae normal.  Cardiovascular:     Rate and Rhythm: Normal rate and regular rhythm.     Pulses: Normal pulses.     Heart sounds: Normal heart sounds.  Pulmonary:     Effort: Pulmonary effort is normal.  Abdominal:     General: There is no distension.  Musculoskeletal:  Cervical back: Normal range of motion.  Skin:    General: Skin is warm and dry.      Findings: Rash present.     Comments: Confluent macular rash on bilateral anterior forearms. Blanchable.   Neurological:     Mental Status: He is alert.        LABORATORY DATA I have reviewed the data as listed    Latest Ref Rng & Units 06/27/2024    1:23 PM 06/20/2024    7:25 AM 06/10/2024   11:20 AM  CBC  WBC 4.0 - 10.5 K/uL 13.5  24.7  8.6   Hemoglobin 13.0 - 17.0 g/dL 85.6  85.4  84.4   Hematocrit 39.0 - 52.0 % 39.5  39.9  42.8   Platelets 150 - 400 K/uL 151  204  271         Latest Ref Rng & Units 06/27/2024    1:23 PM 06/20/2024    7:25 AM 06/10/2024   11:20 AM  CMP  Glucose 70 - 99 mg/dL 854  804  875   BUN 8 - 23 mg/dL 12  23  18    Creatinine 0.61 - 1.24 mg/dL 9.04  9.04  9.19   Sodium 135 - 145 mmol/L 136  138  139   Potassium 3.5 - 5.1 mmol/L 4.3  4.4  3.7   Chloride 98 - 111 mmol/L 100  101  101   CO2 22 - 32 mmol/L 29  29  29    Calcium  8.9 - 10.3 mg/dL 9.6  9.8  89.9   Total Protein 6.5 - 8.1 g/dL 6.9  6.6  7.0   Total Bilirubin 0.0 - 1.2 mg/dL 0.3  0.2  0.5   Alkaline Phos 38 - 126 U/L 93  93  60   AST 15 - 41 U/L 24  20  20    ALT 0 - 44 U/L 50  31  28        RADIOGRAPHIC STUDIES (from last 24 hours if applicable) I have personally reviewed the radiological images as listed and agreed with the findings in the report. No results found.      Visit Diagnosis: 1. 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 Rash      Orders Placed This Encounter  Procedures   Ambulatory referral to Social Work    Referral Priority:   Routine    Referral Type:   Consultation    Referral Reason:   Specialty Services Required    Number of Visits Requested:   1    All questions were answered. The patient knows to call the clinic with any problems, questions or concerns. No barriers to learning was detected.  A total of more than 30 minutes were spent on this encounter with face-to-face time and non-face-to-face time, including  preparing to see the patient, ordering tests and/or medications, counseling the patient and coordination of care as outlined above.    Thank you for allowing me to participate in the care of this patient.    Arisha Gervais E  Walisiewicz, PA-C Department of Hematology/Oncology [facility] Phone: [phone]  [phone]) [phone]    06/27/2024 3:56 PM

## 2024-06-28 ENCOUNTER — Encounter: Payer: Self-pay | Admitting: General Practice

## 2024-06-28 ENCOUNTER — Inpatient Hospital Stay

## 2024-06-28 NOTE — Progress Notes (Signed)
 [facility] Spiritual Care Note  Referred by Nursing for additional layer of support. Phoned Nathan [name] to introduce Spiritual Care as part of his support team and to invite him to Lung Cancer Support Group. He reports that he is doing okay today and is not interested in group. We plan to meet in person when he is on campus for treatment to follow up, and he is aware of ongoing chaplain availability.  [address] [name], [place], [facility] Pager [phone] Voicemail [phone]

## 2024-06-28 NOTE — Progress Notes (Signed)
 [facility] OFFICE PROGRESS NOTE  Nathan Rush, MD [address] [place] [address]  DIAGNOSIS: Stage IIa (T1c, N1, M0) non-small cell lung cancer likely squamous cell carcinoma but adenocarcinoma future could not be completely excluded. He presented with left lower lobe lung nodule in addition to left hilar adenopathy diagnosed in July 2025.   PRIOR THERAPY: None  CURRENT THERAPY: Neoadjuvant systemic chemoimmunotherapy with carboplatin  for AUC of 6, paclitaxel  200 Mg/M2 and nivolumab  360 Mg IV every 3 weeks with Neulasta  support.  First dose 06/13/2024. Status post 1 cycle.    INTERVAL HISTORY: Nathan [name] 63 y.o. male returns to the clinic for a follow up visit accompanied by his daughter. The patient is feeling well today without any concerning complaints. The patient was recently diagnosed with lung cancer. He is undergoing neoadjuvant treatment. He underwent his first cycle of treatment tolerated it well except for some restless legs for a few days and he did develop a rash on his upper extremities. He initially was unsure if this was from poison ivy as this occurred after yard work but it did not improve with calamine lotion. Therefore, he was seen in the [facility] on 9/11 who prescribed a topical steroids. His rash has improved at this time. He denies any more itching. He takes claritin for 5 days when he receives neulasta .    Denies any fever, chills, night sweats, or weight loss. Denies any chest pain, significant cough, or hemoptysis. He states his shortness of breath/breathing is decent. He denies changes in his breathing since his last appointment. Denies any nausea, vomiting, diarrhea, or constipation. Denies any headache or visual changes. The patient is here today for evaluation and repeat blood work before undergoing cycle #2.    MEDICAL HISTORY: Past Medical History:  Diagnosis Date   Anxiety    pt denies   CAD (coronary artery disease)    Diabetes  mellitus    Hepatic steatosis    Hypercholesteremia    Hypertension    Myocardial infarction (HCC) 10/18/2003    ALLERGIES:  has no known allergies.  MEDICATIONS:  Current Outpatient Medications  Medication Sig Dispense Refill   acetaminophen  (TYLENOL ) 500 MG tablet Take 1,000 mg by mouth as needed. For pain     albuterol  (VENTOLIN  HFA) 108 (90 Base) MCG/ACT inhaler Inhale 2 puffs into the lungs every 6 (six) hours as needed for wheezing or shortness of breath. 8 g 0   aspirin EC 81 MG tablet Take 81 mg by mouth at bedtime.     augmented betamethasone dipropionate (DIPROLENE-AF) 0.05 % cream Apply 1 application. topically 2 (two) times daily as needed (skin itching/irritation.).     clopidogrel  (PLAVIX ) 75 MG tablet TAKE 1 TABLET BY MOUTH EVERY DAY (Patient taking differently: Take 75 mg by mouth at bedtime.) 90 tablet 3   doxycycline  (VIBRA -TABS) 100 MG tablet Take 1 tablet (100 mg total) by mouth 2 (two) times daily. 14 tablet 0   hydrochlorothiazide (HYDRODIURIL) 25 MG tablet Take 25 mg by mouth in the morning.     hydrocortisone  1 % lotion Apply 1 Application topically 2 (two) times daily. Apply to rash on arms 118 mL 0   metFORMIN (GLUCOPHAGE) 1000 MG tablet Take 1,000 mg by mouth 2 (two) times daily.     metoprolol (LOPRESSOR) 50 MG tablet Take 50 mg by mouth 2 (two) times daily.      nitroGLYCERIN  (NITROSTAT ) 0.4 MG SL tablet Place 1 tablet (0.4 mg total) under the tongue  every 5 (five) minutes as needed for chest pain. Max 3 dose 25 tablet 3   olmesartan  (BENICAR ) 40 MG tablet TAKE 1 TABLET (40 MG TOTAL) BY MOUTH IN THE MORNING 90 tablet 3   ondansetron  (ZOFRAN ) 8 MG tablet Take 1 tablet (8 mg total) by mouth every 8 (eight) hours as needed for nausea or vomiting. Start on the third day after chemotherapy. 30 tablet 1   prochlorperazine  (COMPAZINE ) 10 MG tablet Take 1 tablet (10 mg total) by mouth every 6 (six) hours as needed for nausea or vomiting. 30 tablet 1   rosuvastatin   (CRESTOR ) 10 MG tablet Take 1 tablet (10 mg total) by mouth daily. 90 tablet 3   No current facility-administered medications for this visit.    SURGICAL HISTORY:  Past Surgical History:  Procedure Laterality Date   APPENDECTOMY     CARDIAC CATHETERIZATION  01/25/2004   Cypher DES (3.5x79mm) for PPMI to RCA (Dr. IVAR Sor)   CARDIAC CATHETERIZATION  01/03/2005   patent stent with noncritical 30% Cfx disease and 30% LAD disease (Dr. DOROTHA Schwalbe)   COLONOSCOPY  09/06/2011   Procedure: COLONOSCOPY;  Surgeon: Oneil DELENA Budge;  [facility];  Service: Gastroenterology;  Laterality: N/A;   COLONOSCOPY WITH PROPOFOL  N/A 01/20/2022   Procedure: COLONOSCOPY WITH PROPOFOL ;  Surgeon: Shaaron Lamar HERO, MD;  Location: [facility];  Service: Endoscopy;  Laterality: N/A;  9:15am   NM MYOCAR PERF WALL MOTION  11/2010   bruce myoview ; normal pattern of perfusion in all regions, post-stress EF 62%; normal, low risk scan with improved perfusion from previous study   TRANSTHORACIC ECHOCARDIOGRAM  12/2011   EF=>55%, mild conc LVH; LA mildly dilated; trace MR & TR with normal RSVP; trace pulm valve regurg   VIDEO BRONCHOSCOPY WITH ENDOBRONCHIAL NAVIGATION Left 05/06/2024   Procedure: VIDEO BRONCHOSCOPY WITH ENDOBRONCHIAL NAVIGATION;  Surgeon: [name], MD;  Location: [facility];  Service: Pulmonary;  Laterality: Left;    REVIEW OF SYSTEMS:   Review of Systems  Constitutional: Negative for appetite change, chills, fatigue, fever and unexpected weight change.  HENT: Negative for mouth sores, nosebleeds, sore throat and trouble swallowing.   Eyes: Negative for eye problems and icterus.  Respiratory: Stable intermittent dyspnea. Negative for cough, hemoptysis, and wheezing.   Cardiovascular: Negative for chest pain and leg swelling.  Gastrointestinal: Negative for abdominal pain, constipation, diarrhea, nausea and vomiting.  Genitourinary: Negative for bladder incontinence, difficulty urinating,  dysuria, frequency and hematuria.   Musculoskeletal: Negative for back pain, gait problem, neck pain and neck stiffness.  Skin: Improved rash and itching.  Neurological: Negative for dizziness, extremity weakness, gait problem, headaches, light-headedness and seizures.  Hematological: Negative for adenopathy. Does not bruise/bleed easily.  Psychiatric/Behavioral: Negative for confusion, depression and sleep disturbance. The patient is not nervous/anxious.     PHYSICAL EXAMINATION:  Blood pressure 112/77, pulse 71, temperature 98.3 F (36.8 C), temperature source Temporal, resp. rate 15, weight 163 lb 12.8 oz (74.3 kg), SpO2 98%.  ECOG PERFORMANCE STATUS: 1  Physical Exam  Constitutional: Oriented to person, place, and time and well-developed, well-nourished, and in no distress.  HENT:  Head: Normocephalic and atraumatic.  Mouth/Throat: Oropharynx is clear and moist. No oropharyngeal exudate.  Eyes: Conjunctivae are normal. Right eye exhibits no discharge. Left eye exhibits no discharge. No scleral icterus.  Neck: Normal range of motion. Neck supple.  Cardiovascular: Normal rate, regular rhythm, normal heart sounds and intact distal pulses.   Pulmonary/Chest: Effort normal and breath sounds  normal. No respiratory distress. No wheezes. No rales.  Abdominal: Soft. Bowel sounds are normal. Exhibits no distension and no mass. There is no tenderness.  Musculoskeletal: Normal range of motion. Exhibits no edema.  Lymphadenopathy:    No cervical adenopathy.  Neurological: Alert and oriented to person, place, and time. Exhibits normal muscle tone. Gait normal. Coordination normal.  Skin: Skin is warm and dry. Dry skin and rash improved. Not diaphoretic. No erythema. No pallor.  Psychiatric: Mood, memory and judgment normal.  Vitals reviewed.  LABORATORY DATA: Lab Results  Component Value Date   WBC 10.9 (H) 07/02/2024   HGB 13.2 07/02/2024   HCT 37.0 (L) 07/02/2024   MCV 88.5 07/02/2024    PLT 331 07/02/2024      Chemistry      Component Value Date/Time   NA 138 07/02/2024 0945   K 4.4 07/02/2024 0945   CL 103 07/02/2024 0945   CO2 28 07/02/2024 0945   BUN 12 07/02/2024 0945   CREATININE 0.88 07/02/2024 0945   CREATININE 0.83 05/18/2013 1055      Component Value Date/Time   CALCIUM  9.3 07/02/2024 0945   ALKPHOS 78 07/02/2024 0945   AST 18 07/02/2024 0945   ALT 28 07/02/2024 0945   BILITOT 0.4 07/02/2024 0945       RADIOGRAPHIC STUDIES:  No results found.   ASSESSMENT/PLAN:  This is a very pleasant 63 year old Caucasian male with Stage IIa (T1c, N1, M0) non-small cell lung cancer likely squamous cell carcinoma but adenocarcinoma future could not be completely excluded. He presented with left lower lobe lung nodule in addition to left hilar adenopathy diagnosed in July 2025.    He is currently undergoing neoadjuvant chemo/immunotherapy with  carboplatin  for AUC of 6, paclitaxel  200 Mg/M2, and nivolumab  360 Mg IV, one day every three weeks. The plan is to undergo 3 cycles of treatment. He is status post 1 cycle.    He tolerated it well overall but did develop a rash on his upper extremities which improved with steroid cream.   His pre-meds have zyrtec  instead of benadryl  for the restless legs.   Labs were reviewed. I reviewed his rash with Dr. Sherrod and the decision to proceed with treatment with or without immunotherapy. Given that his rash improved, Dr. Sherrod recommend he continue on the same treatment at the same dose with chemo and immunotherapy.    We will see him back in 3 weeks for evaluation and repeat blood work before undergoing cycle #3.   If he develops a significant rash that is not managed with his steroid cream, he will contact us  and we may consider a medrol Dosepak, although would be preferred to avoid in a patient who is on immunotherapy. The patient reports pre-diabetes.    The patient was advised to call immediately if he has any  concerning symptoms in the interval. The patient voices understanding of current disease status and treatment options and is in agreement with the current care plan. All questions were answered. The patient knows to call the clinic with any problems, questions or concerns. We can certainly see the patient much sooner if necessary    No orders of the defined types were placed in this encounter.   The total time spent in the appointment was 20-29 minutes  Wannetta Langland L Noreen Mackintosh, PA-C 07/02/24

## 2024-07-01 MED FILL — Fosaprepitant Dimeglumine For IV Infusion 150 MG (Base Eq): INTRAVENOUS | Qty: 5 | Status: AC

## 2024-07-02 ENCOUNTER — Inpatient Hospital Stay

## 2024-07-02 ENCOUNTER — Encounter: Payer: Self-pay | Admitting: General Practice

## 2024-07-02 ENCOUNTER — Inpatient Hospital Stay (HOSPITAL_BASED_OUTPATIENT_CLINIC_OR_DEPARTMENT_OTHER): Admitting: Physician Assistant

## 2024-07-02 ENCOUNTER — Encounter: Payer: Self-pay | Admitting: Internal Medicine

## 2024-07-02 VITALS — BP 112/77 | HR 71 | Temp 98.3°F | Resp 15 | Wt 163.8 lb

## 2024-07-02 DIAGNOSIS — Z5111 Encounter for antineoplastic chemotherapy: Secondary | ICD-10-CM | POA: Insufficient documentation

## 2024-07-02 DIAGNOSIS — Z5112 Encounter for antineoplastic immunotherapy: Secondary | ICD-10-CM | POA: Diagnosis not present

## 2024-07-02 DIAGNOSIS — C3432 Malignant neoplasm of lower lobe, left bronchus or lung: Secondary | ICD-10-CM | POA: Diagnosis not present

## 2024-07-02 LAB — CMP (CANCER CENTER ONLY)
ALT: 28 U/L (ref 0–44)
AST: 18 U/L (ref 15–41)
Albumin: 4 g/dL (ref 3.5–5.0)
Alkaline Phosphatase: 78 U/L (ref 38–126)
Anion gap: 7 (ref 5–15)
BUN: 12 mg/dL (ref 8–23)
CO2: 28 mmol/L (ref 22–32)
Calcium: 9.3 mg/dL (ref 8.9–10.3)
Chloride: 103 mmol/L (ref 98–111)
Creatinine: 0.88 mg/dL (ref 0.61–1.24)
GFR, Estimated: >60 mL/min (ref >60)
Glucose, Bld: 171 mg/dL — ABNORMAL HIGH (ref 70–99)
Potassium: 4.4 mmol/L (ref 3.5–5.1)
Sodium: 138 mmol/L (ref 135–145)
Total Bilirubin: 0.4 mg/dL (ref 0.0–1.2)
Total Protein: 6.6 g/dL (ref 6.5–8.1)

## 2024-07-02 LAB — CBC WITH DIFFERENTIAL (CANCER CENTER ONLY)
Abs Immature Granulocytes: 0.09 K/uL — ABNORMAL HIGH (ref 0.00–0.07)
Basophils Absolute: 0.3 K/uL — ABNORMAL HIGH (ref 0.0–0.1)
Basophils Relative: 3 %
Eosinophils Absolute: 0.1 K/uL (ref 0.0–0.5)
Eosinophils Relative: 1 %
HCT: 37 % — ABNORMAL LOW (ref 39.0–52.0)
Hemoglobin: 13.2 g/dL (ref 13.0–17.0)
Immature Granulocytes: 1 %
Lymphocytes Relative: 17 %
Lymphs Abs: 1.9 K/uL (ref 0.7–4.0)
MCH: 31.6 pg (ref 26.0–34.0)
MCHC: 35.7 g/dL (ref 30.0–36.0)
MCV: 88.5 fL (ref 80.0–100.0)
Monocytes Absolute: 0.8 K/uL (ref 0.1–1.0)
Monocytes Relative: 8 %
Neutro Abs: 7.7 K/uL (ref 1.7–7.7)
Neutrophils Relative %: 70 %
Platelet Count: 331 K/uL (ref 150–400)
RBC: 4.18 MIL/uL — ABNORMAL LOW (ref 4.22–5.81)
RDW: 13 % (ref 11.5–15.5)
WBC Count: 10.9 K/uL — ABNORMAL HIGH (ref 4.0–10.5)
nRBC: 0 % (ref 0.0–0.2)

## 2024-07-02 MED ORDER — SODIUM CHLORIDE 0.9 % IV SOLN
200.0000 mg/m2 | Freq: Once | INTRAVENOUS | Status: AC
  Administered 2024-07-02: 372 mg via INTRAVENOUS
  Filled 2024-07-02: qty 62

## 2024-07-02 MED ORDER — SODIUM CHLORIDE 0.9 % IV SOLN
360.0000 mg | Freq: Once | INTRAVENOUS | Status: AC
  Administered 2024-07-02: 360 mg via INTRAVENOUS
  Filled 2024-07-02: qty 12

## 2024-07-02 MED ORDER — SODIUM CHLORIDE 0.9 % IV SOLN
150.0000 mg | Freq: Once | INTRAVENOUS | Status: AC
  Administered 2024-07-02: 150 mg via INTRAVENOUS
  Filled 2024-07-02: qty 150

## 2024-07-02 MED ORDER — SODIUM CHLORIDE 0.9 % IV SOLN: INTRAVENOUS | Status: DC

## 2024-07-02 MED ORDER — SODIUM CHLORIDE 0.9 % IV SOLN
729.0000 mg | Freq: Once | INTRAVENOUS | Status: AC
  Administered 2024-07-02: 730 mg via INTRAVENOUS
  Filled 2024-07-02: qty 73

## 2024-07-02 MED ORDER — CETIRIZINE HCL 10 MG/ML IV SOLN
10.0000 mg | Freq: Once | INTRAVENOUS | Status: AC
  Administered 2024-07-02: 10 mg via INTRAVENOUS
  Filled 2024-07-02: qty 1

## 2024-07-02 MED ORDER — PALONOSETRON HCL INJECTION 0.25 MG/5ML
0.2500 mg | Freq: Once | INTRAVENOUS | Status: AC
  Administered 2024-07-02: 0.25 mg via INTRAVENOUS
  Filled 2024-07-02: qty 5

## 2024-07-02 MED ORDER — FAMOTIDINE IN NACL 20-0.9 MG/50ML-% IV SOLN
20.0000 mg | Freq: Once | INTRAVENOUS | Status: AC
  Administered 2024-07-02: 20 mg via INTRAVENOUS
  Filled 2024-07-02: qty 50

## 2024-07-02 MED ORDER — DEXAMETHASONE SODIUM PHOSPHATE 10 MG/ML IJ SOLN
10.0000 mg | Freq: Once | INTRAMUSCULAR | Status: AC
  Administered 2024-07-02: 10 mg via INTRAVENOUS
  Filled 2024-07-02: qty 1

## 2024-07-02 NOTE — Progress Notes (Signed)
 [facility] Spiritual Care Note  Followed up with Nathan [name] in infusion, meeting his daughter [name]. They report no needs at this time but do have direct Spiritual Care number in case needs arise or circumstances change.  [address] [name], [place], [facility] Pager [phone] Voicemail [phone]

## 2024-07-02 NOTE — Patient Instructions (Signed)
 [facility] - A DEPT OF [facility]  Discharge Instructions: Thank you for choosing [facility] to provide your oncology and hematology care.   If you have a lab appointment with the Cancer Center, please go directly to the Cancer Center and check in at the registration area.   Wear comfortable clothing and clothing appropriate for easy access to any Portacath or PICC line.   We strive to give you quality time with your provider. You may need to reschedule your appointment if you arrive late (15 or more minutes).  Arriving late affects you and other patients whose appointments are after yours.  Also, if you miss three or more appointments without notifying the office, you may be dismissed from the clinic at the provider's discretion.      For prescription refill requests, have your pharmacy contact our office and allow 72 hours for refills to be completed.    Today you received the following chemotherapy and/or immunotherapy agents opdivo , taxol , carboplatin       To help prevent nausea and vomiting after your treatment, we encourage you to take your nausea medication as directed.  BELOW ARE SYMPTOMS THAT SHOULD BE REPORTED IMMEDIATELY: *FEVER GREATER THAN 100.4 F (38 C) OR HIGHER *CHILLS OR SWEATING *NAUSEA AND VOMITING THAT IS NOT CONTROLLED WITH YOUR NAUSEA MEDICATION *UNUSUAL SHORTNESS OF BREATH *UNUSUAL BRUISING OR BLEEDING *URINARY PROBLEMS (pain or burning when urinating, or frequent urination) *BOWEL PROBLEMS (unusual diarrhea, constipation, pain near the anus) TENDERNESS IN MOUTH AND THROAT WITH OR WITHOUT PRESENCE OF ULCERS (sore throat, sores in mouth, or a toothache) UNUSUAL RASH, SWELLING OR PAIN  UNUSUAL VAGINAL DISCHARGE OR ITCHING   Items with * indicate a potential emergency and should be followed up as soon as possible or go to the Emergency Department if any problems should occur.  Please show the CHEMOTHERAPY ALERT CARD  or IMMUNOTHERAPY ALERT CARD at check-in to the Emergency Department and triage nurse.  Should you have questions after your visit or need to cancel or reschedule your appointment, please contact [facility] - A DEPT OF [name]  Dept: [phone]  and follow the prompts.  Office hours are 8:00 a.m. to 4:30 p.m. Monday - Friday. Please note that voicemails left after 4:00 p.m. may not be returned until the following business day.  We are closed weekends and major holidays. You have access to a nurse at all times for urgent questions. Please call the main number to the clinic Dept: [phone] and follow the prompts.   For any non-urgent questions, you may also contact your provider using MyChart. We now offer e-Visits for anyone 14 and older to request care online for non-urgent symptoms. For details visit [email or web address].   Also download the MyChart app! Go to the app store, search MyChart, open the app, select , and log in with your MyChart username and password.

## 2024-07-04 ENCOUNTER — Encounter: Payer: Self-pay | Admitting: Internal Medicine

## 2024-07-04 ENCOUNTER — Inpatient Hospital Stay

## 2024-07-04 VITALS — BP 132/58 | HR 65 | Temp 98.8°F | Resp 16

## 2024-07-04 DIAGNOSIS — C3432 Malignant neoplasm of lower lobe, left bronchus or lung: Secondary | ICD-10-CM

## 2024-07-04 MED ORDER — PEGFILGRASTIM-CBQV 6 MG/0.6ML ~~LOC~~ SOSY
6.0000 mg | PREFILLED_SYRINGE | Freq: Once | SUBCUTANEOUS | Status: AC
  Administered 2024-07-04: 6 mg via SUBCUTANEOUS
  Filled 2024-07-04: qty 0.6

## 2024-07-09 ENCOUNTER — Inpatient Hospital Stay: Admitting: Licensed Clinical Social Worker

## 2024-07-09 DIAGNOSIS — C3432 Malignant neoplasm of lower lobe, left bronchus or lung: Secondary | ICD-10-CM

## 2024-07-09 NOTE — Progress Notes (Signed)
 [facility] CSW Progress Note  Clinical Child psychotherapist contacted patient by phone to follow-up on emotional support.    Interventions: Pt received a referral to follow up w/ pt regarding supportive services.  Pt's wife passed just prior to being diagnosed w/ cancer.  CSW spoke w/ pt regarding supportive services offered at the cancer center including individual counseling.  Pt informed of the [facility] for additional support as well.  Pt declining support at this time, but receptive to receiving contact information for CSW should he wish to pursue additional support in the future.        Follow Up Plan:  Patient will contact CSW with any support or resource needs    [name], LCSW Clinical Social Worker [facility]

## 2024-07-11 ENCOUNTER — Inpatient Hospital Stay

## 2024-07-11 DIAGNOSIS — C3432 Malignant neoplasm of lower lobe, left bronchus or lung: Secondary | ICD-10-CM

## 2024-07-11 LAB — CBC WITH DIFFERENTIAL (CANCER CENTER ONLY)
Abs Immature Granulocytes: 1.74 K/uL — ABNORMAL HIGH (ref 0.00–0.07)
Basophils Absolute: 0.1 K/uL (ref 0.0–0.1)
Basophils Relative: 0 %
Eosinophils Absolute: 0.1 K/uL (ref 0.0–0.5)
Eosinophils Relative: 1 %
HCT: 34.7 % — ABNORMAL LOW (ref 39.0–52.0)
Hemoglobin: 12.5 g/dL — ABNORMAL LOW (ref 13.0–17.0)
Immature Granulocytes: 8 %
Lymphocytes Relative: 9 %
Lymphs Abs: 1.9 K/uL (ref 0.7–4.0)
MCH: 32 pg (ref 26.0–34.0)
MCHC: 36 g/dL (ref 30.0–36.0)
MCV: 88.7 fL (ref 80.0–100.0)
Monocytes Absolute: 2.5 K/uL — ABNORMAL HIGH (ref 0.1–1.0)
Monocytes Relative: 11 %
Neutro Abs: 16 K/uL — ABNORMAL HIGH (ref 1.7–7.7)
Neutrophils Relative %: 71 %
Platelet Count: 146 K/uL — ABNORMAL LOW (ref 150–400)
RBC: 3.91 MIL/uL — ABNORMAL LOW (ref 4.22–5.81)
RDW: 13.5 % (ref 11.5–15.5)
Smear Review: NORMAL
WBC Count: 22.3 K/uL — ABNORMAL HIGH (ref 4.0–10.5)
nRBC: 0 % (ref 0.0–0.2)

## 2024-07-11 LAB — CMP (CANCER CENTER ONLY)
ALT: 32 U/L (ref 0–44)
AST: 20 U/L (ref 15–41)
Albumin: 4 g/dL (ref 3.5–5.0)
Alkaline Phosphatase: 105 U/L (ref 38–126)
Anion gap: 7 (ref 5–15)
BUN: 14 mg/dL (ref 8–23)
CO2: 27 mmol/L (ref 22–32)
Calcium: 9.4 mg/dL (ref 8.9–10.3)
Chloride: 103 mmol/L (ref 98–111)
Creatinine: 0.75 mg/dL (ref 0.61–1.24)
GFR, Estimated: >60 mL/min (ref >60)
Glucose, Bld: 152 mg/dL — ABNORMAL HIGH (ref 70–99)
Potassium: 4.1 mmol/L (ref 3.5–5.1)
Sodium: 137 mmol/L (ref 135–145)
Total Bilirubin: 0.2 mg/dL (ref 0.0–1.2)
Total Protein: 6.7 g/dL (ref 6.5–8.1)

## 2024-07-12 ENCOUNTER — Telehealth: Payer: Self-pay | Admitting: Medical Oncology

## 2024-07-12 ENCOUNTER — Inpatient Hospital Stay

## 2024-07-12 NOTE — Telephone Encounter (Signed)
 Patient reports noticing red dots on the top of the left foot (not involving the toes) two days ago. States he began applying a cream for athlete's foot at that time. Patient reports the area is not worsening and appears slightly improved. Denies itching, redness, drainage, or warmth to the area. Per Cassie, patient was instructed to monitor the area for any signs of infection and to report any changes. He voiced understanding.

## 2024-07-18 ENCOUNTER — Inpatient Hospital Stay: Attending: Internal Medicine

## 2024-07-18 ENCOUNTER — Inpatient Hospital Stay

## 2024-07-18 DIAGNOSIS — L309 Dermatitis, unspecified: Secondary | ICD-10-CM | POA: Diagnosis not present

## 2024-07-18 DIAGNOSIS — Z7982 Long term (current) use of aspirin: Secondary | ICD-10-CM | POA: Diagnosis not present

## 2024-07-18 DIAGNOSIS — Z79899 Other long term (current) drug therapy: Secondary | ICD-10-CM | POA: Diagnosis not present

## 2024-07-18 DIAGNOSIS — Z5111 Encounter for antineoplastic chemotherapy: Secondary | ICD-10-CM | POA: Insufficient documentation

## 2024-07-18 DIAGNOSIS — I10 Essential (primary) hypertension: Secondary | ICD-10-CM | POA: Insufficient documentation

## 2024-07-18 DIAGNOSIS — M25551 Pain in right hip: Secondary | ICD-10-CM | POA: Insufficient documentation

## 2024-07-18 DIAGNOSIS — E119 Type 2 diabetes mellitus without complications: Secondary | ICD-10-CM | POA: Insufficient documentation

## 2024-07-18 DIAGNOSIS — I2510 Atherosclerotic heart disease of native coronary artery without angina pectoris: Secondary | ICD-10-CM | POA: Insufficient documentation

## 2024-07-18 DIAGNOSIS — D649 Anemia, unspecified: Secondary | ICD-10-CM | POA: Diagnosis not present

## 2024-07-18 DIAGNOSIS — Z5112 Encounter for antineoplastic immunotherapy: Secondary | ICD-10-CM | POA: Diagnosis not present

## 2024-07-18 DIAGNOSIS — I252 Old myocardial infarction: Secondary | ICD-10-CM | POA: Insufficient documentation

## 2024-07-18 DIAGNOSIS — Z7984 Long term (current) use of oral hypoglycemic drugs: Secondary | ICD-10-CM | POA: Diagnosis not present

## 2024-07-18 DIAGNOSIS — E7800 Pure hypercholesterolemia, unspecified: Secondary | ICD-10-CM | POA: Insufficient documentation

## 2024-07-18 DIAGNOSIS — Z5189 Encounter for other specified aftercare: Secondary | ICD-10-CM | POA: Insufficient documentation

## 2024-07-18 DIAGNOSIS — C3432 Malignant neoplasm of lower lobe, left bronchus or lung: Secondary | ICD-10-CM | POA: Diagnosis present

## 2024-07-18 DIAGNOSIS — K760 Fatty (change of) liver, not elsewhere classified: Secondary | ICD-10-CM | POA: Diagnosis not present

## 2024-07-18 DIAGNOSIS — G2581 Restless legs syndrome: Secondary | ICD-10-CM | POA: Insufficient documentation

## 2024-07-18 DIAGNOSIS — T451X5A Adverse effect of antineoplastic and immunosuppressive drugs, initial encounter: Secondary | ICD-10-CM | POA: Insufficient documentation

## 2024-07-18 DIAGNOSIS — Z23 Encounter for immunization: Secondary | ICD-10-CM | POA: Insufficient documentation

## 2024-07-18 LAB — CMP (CANCER CENTER ONLY)
ALT: 33 U/L (ref 0–44)
AST: 20 U/L (ref 15–41)
Albumin: 4 g/dL (ref 3.5–5.0)
Alkaline Phosphatase: 90 U/L (ref 38–126)
Anion gap: 6 (ref 5–15)
BUN: 17 mg/dL (ref 8–23)
CO2: 26 mmol/L (ref 22–32)
Calcium: 9.4 mg/dL (ref 8.9–10.3)
Chloride: 103 mmol/L (ref 98–111)
Creatinine: 0.83 mg/dL (ref 0.61–1.24)
GFR, Estimated: >60 mL/min (ref >60)
Glucose, Bld: 175 mg/dL — ABNORMAL HIGH (ref 70–99)
Potassium: 4.4 mmol/L (ref 3.5–5.1)
Sodium: 135 mmol/L (ref 135–145)
Total Bilirubin: 0.3 mg/dL (ref 0.0–1.2)
Total Protein: 7.1 g/dL (ref 6.5–8.1)

## 2024-07-18 LAB — CBC WITH DIFFERENTIAL (CANCER CENTER ONLY)
Abs Immature Granulocytes: 0.06 K/uL (ref 0.00–0.07)
Basophils Absolute: 0.1 K/uL (ref 0.0–0.1)
Basophils Relative: 1 %
Eosinophils Absolute: 0 K/uL (ref 0.0–0.5)
Eosinophils Relative: 0 %
HCT: 34.4 % — ABNORMAL LOW (ref 39.0–52.0)
Hemoglobin: 12.1 g/dL — ABNORMAL LOW (ref 13.0–17.0)
Immature Granulocytes: 1 %
Lymphocytes Relative: 15 %
Lymphs Abs: 1.9 K/uL (ref 0.7–4.0)
MCH: 31.8 pg (ref 26.0–34.0)
MCHC: 35.2 g/dL (ref 30.0–36.0)
MCV: 90.5 fL (ref 80.0–100.0)
Monocytes Absolute: 1.1 K/uL — ABNORMAL HIGH (ref 0.1–1.0)
Monocytes Relative: 9 %
Neutro Abs: 9.2 K/uL — ABNORMAL HIGH (ref 1.7–7.7)
Neutrophils Relative %: 74 %
Platelet Count: 108 K/uL — ABNORMAL LOW (ref 150–400)
RBC: 3.8 MIL/uL — ABNORMAL LOW (ref 4.22–5.81)
RDW: 14.8 % (ref 11.5–15.5)
WBC Count: 12.3 K/uL — ABNORMAL HIGH (ref 4.0–10.5)
nRBC: 0 % (ref 0.0–0.2)

## 2024-07-19 ENCOUNTER — Inpatient Hospital Stay

## 2024-07-24 MED FILL — Fosaprepitant Dimeglumine For IV Infusion 150 MG (Base Eq): INTRAVENOUS | Qty: 5 | Status: AC

## 2024-07-25 ENCOUNTER — Inpatient Hospital Stay

## 2024-07-25 ENCOUNTER — Telehealth: Payer: Self-pay | Admitting: *Deleted

## 2024-07-25 ENCOUNTER — Inpatient Hospital Stay: Admitting: Internal Medicine

## 2024-07-25 VITALS — BP 128/85 | HR 80 | Resp 16

## 2024-07-25 VITALS — BP 118/74 | HR 72 | Temp 96.6°F | Resp 17 | Ht 66.0 in | Wt 167.5 lb

## 2024-07-25 DIAGNOSIS — C3432 Malignant neoplasm of lower lobe, left bronchus or lung: Secondary | ICD-10-CM

## 2024-07-25 DIAGNOSIS — C3490 Malignant neoplasm of unspecified part of unspecified bronchus or lung: Secondary | ICD-10-CM | POA: Diagnosis not present

## 2024-07-25 LAB — CBC WITH DIFFERENTIAL (CANCER CENTER ONLY)
Abs Immature Granulocytes: 0.04 K/uL (ref 0.00–0.07)
Basophils Absolute: 0.1 K/uL (ref 0.0–0.1)
Basophils Relative: 1 %
Eosinophils Absolute: 0 K/uL (ref 0.0–0.5)
Eosinophils Relative: 0 %
HCT: 35.1 % — ABNORMAL LOW (ref 39.0–52.0)
Hemoglobin: 12.7 g/dL — ABNORMAL LOW (ref 13.0–17.0)
Immature Granulocytes: 0 %
Lymphocytes Relative: 19 %
Lymphs Abs: 1.7 K/uL (ref 0.7–4.0)
MCH: 32.8 pg (ref 26.0–34.0)
MCHC: 36.2 g/dL — ABNORMAL HIGH (ref 30.0–36.0)
MCV: 90.7 fL (ref 80.0–100.0)
Monocytes Absolute: 0.8 K/uL (ref 0.1–1.0)
Monocytes Relative: 9 %
Neutro Abs: 6.6 K/uL (ref 1.7–7.7)
Neutrophils Relative %: 71 %
Platelet Count: 220 K/uL (ref 150–400)
RBC: 3.87 MIL/uL — ABNORMAL LOW (ref 4.22–5.81)
RDW: 17 % — ABNORMAL HIGH (ref 11.5–15.5)
WBC Count: 9.3 K/uL (ref 4.0–10.5)
nRBC: 0 % (ref 0.0–0.2)

## 2024-07-25 LAB — CMP (CANCER CENTER ONLY)
ALT: 27 U/L (ref 0–44)
AST: 20 U/L (ref 15–41)
Albumin: 4.1 g/dL (ref 3.5–5.0)
Alkaline Phosphatase: 71 U/L (ref 38–126)
Anion gap: 7 (ref 5–15)
BUN: 13 mg/dL (ref 8–23)
CO2: 27 mmol/L (ref 22–32)
Calcium: 9.3 mg/dL (ref 8.9–10.3)
Chloride: 104 mmol/L (ref 98–111)
Creatinine: 0.8 mg/dL (ref 0.61–1.24)
GFR, Estimated: >60 mL/min (ref >60)
Glucose, Bld: 150 mg/dL — ABNORMAL HIGH (ref 70–99)
Potassium: 4.2 mmol/L (ref 3.5–5.1)
Sodium: 138 mmol/L (ref 135–145)
Total Bilirubin: 0.5 mg/dL (ref 0.0–1.2)
Total Protein: 7 g/dL (ref 6.5–8.1)

## 2024-07-25 LAB — TSH: TSH: 3.14 u[IU]/mL (ref 0.350–4.500)

## 2024-07-25 MED ORDER — SODIUM CHLORIDE 0.9 % IV SOLN: INTRAVENOUS | Status: DC

## 2024-07-25 MED ORDER — SODIUM CHLORIDE 0.9% FLUSH: 10.0000 mL | INTRAVENOUS | Status: DC | PRN

## 2024-07-25 MED ORDER — SODIUM CHLORIDE 0.9 % IV SOLN
200.0000 mg/m2 | Freq: Once | INTRAVENOUS | Status: AC
  Administered 2024-07-25: 372 mg via INTRAVENOUS
  Filled 2024-07-25: qty 62

## 2024-07-25 MED ORDER — CETIRIZINE HCL 10 MG/ML IV SOLN
10.0000 mg | Freq: Once | INTRAVENOUS | Status: AC
  Administered 2024-07-25: 10 mg via INTRAVENOUS
  Filled 2024-07-25: qty 1

## 2024-07-25 MED ORDER — DEXAMETHASONE SODIUM PHOSPHATE 10 MG/ML IJ SOLN
10.0000 mg | Freq: Once | INTRAMUSCULAR | Status: AC
  Administered 2024-07-25: 10 mg via INTRAVENOUS

## 2024-07-25 MED ORDER — SODIUM CHLORIDE 0.9 % IV SOLN
360.0000 mg | Freq: Once | INTRAVENOUS | Status: AC
  Administered 2024-07-25: 360 mg via INTRAVENOUS
  Filled 2024-07-25: qty 12

## 2024-07-25 MED ORDER — FAMOTIDINE IN NACL 20-0.9 MG/50ML-% IV SOLN
20.0000 mg | Freq: Once | INTRAVENOUS | Status: AC
  Administered 2024-07-25: 20 mg via INTRAVENOUS
  Filled 2024-07-25: qty 50

## 2024-07-25 MED ORDER — PALONOSETRON HCL INJECTION 0.25 MG/5ML
0.2500 mg | Freq: Once | INTRAVENOUS | Status: AC
  Administered 2024-07-25: 0.25 mg via INTRAVENOUS
  Filled 2024-07-25: qty 5

## 2024-07-25 MED ORDER — INFLUENZA VIRUS VACC SPLIT PF (FLUZONE) 0.5 ML IM SUSY
0.5000 mL | PREFILLED_SYRINGE | Freq: Once | INTRAMUSCULAR | Status: AC
  Administered 2024-07-25: 0.5 mL via INTRAMUSCULAR
  Filled 2024-07-25: qty 0.5

## 2024-07-25 MED ORDER — SODIUM CHLORIDE 0.9 % IV SOLN
729.0000 mg | Freq: Once | INTRAVENOUS | Status: AC
  Administered 2024-07-25: 730 mg via INTRAVENOUS
  Filled 2024-07-25: qty 73

## 2024-07-25 MED ORDER — SODIUM CHLORIDE 0.9 % IV SOLN
150.0000 mg | Freq: Once | INTRAVENOUS | Status: AC
  Administered 2024-07-25: 150 mg via INTRAVENOUS
  Filled 2024-07-25: qty 150

## 2024-07-25 NOTE — Telephone Encounter (Signed)
 Nathan [name] in reporting receipt of a check so he wanted us  to know completing form may not be needed.  Has been called about return to work and may be having surgery scheduled with an 8-week recovery.  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  Advised to connect with disability team to request extension to update request.  Holding onto form advising he call as the approval may be preliminary decision while awaiting form and pathology.

## 2024-07-25 NOTE — Progress Notes (Signed)
 [facility] [phone]   [phone]  OFFICE PROGRESS NOTE  Nathan Rush, MD [address] [place] [address]  DIAGNOSIS: Stage IIA (T1c, N1, M0) non-small cell lung cancer likely squamous cell carcinoma but adenocarcinoma future could not be completely excluded. He presented with left lower lobe lung nodule in addition to left hilar adenopathy diagnosed in July 2025.   PRIOR THERAPY: None  CURRENT THERAPY: Neoadjuvant systemic chemoimmunotherapy with carboplatin  for AUC of 6, paclitaxel  200 Mg/M2 and nivolumab  360 Mg IV every 3 weeks with Neulasta  support.  First dose 06/13/2024.  Status post 2 cycles.  INTERVAL HISTORY: Nathan [name] 63 y.o. male returns to the clinic today for follow-up visit accompanied by his daughter,  [name]. Discussed the use of AI scribe software for clinical note transcription with the patient, who gave verbal consent to proceed.  History of Present Illness Nathan [name] is a 63 year old male with stage two non-small cell lung cancer, squamous cell carcinoma, who presents for evaluation before starting cycle three of neoadjuvant systemic chemo immunotherapy. He is accompanied by his youngest daughter, [name].  He was diagnosed with stage two non-small cell lung cancer, squamous cell carcinoma, in July 2025 and is undergoing neoadjuvant systemic chemo immunotherapy with carboplatin , paclitaxel , and nivolumab  every three weeks. He has completed two cycles and is here for evaluation before starting the third cycle.  He experiences significant fatigue for about three to four days post-treatment, describing this period as feeling 'wore out'.  In the last couple of days, he has experienced severe pain in his right hip, described as similar to being stung by bees or hornets. This pain occurs when he lies down and has happened a couple of times. His daughter suggested it might be nerve damage or sciatica.  He also reports  a rash on his feet, initially on one side and now on both, which he manages with lotion.  Additionally, he experiences restless leg syndrome at night, affecting his sleep. He is not currently taking any iron supplements, although he has mild anemia.    MEDICAL HISTORY: Past Medical History:  Diagnosis Date   Anxiety    pt denies   CAD (coronary artery disease)    Diabetes mellitus    Hepatic steatosis    Hypercholesteremia    Hypertension    Myocardial infarction (HCC) 10/18/2003    ALLERGIES:  has no known allergies.  MEDICATIONS:  Current Outpatient Medications  Medication Sig Dispense Refill   acetaminophen  (TYLENOL ) 500 MG tablet Take 1,000 mg by mouth as needed. For pain     albuterol  (VENTOLIN  HFA) 108 (90 Base) MCG/ACT inhaler Inhale 2 puffs into the lungs every 6 (six) hours as needed for wheezing or shortness of breath. 8 g 0   aspirin EC 81 MG tablet Take 81 mg by mouth at bedtime.     augmented betamethasone dipropionate (DIPROLENE-AF) 0.05 % cream Apply 1 application. topically 2 (two) times daily as needed (skin itching/irritation.).     clopidogrel  (PLAVIX ) 75 MG tablet TAKE 1 TABLET BY MOUTH EVERY DAY (Patient taking differently: Take 75 mg by mouth at bedtime.) 90 tablet 3   doxycycline  (VIBRA -TABS) 100 MG tablet Take 1 tablet (100 mg total) by mouth 2 (two) times daily. 14 tablet 0   hydrochlorothiazide (HYDRODIURIL) 25 MG tablet Take 25 mg by mouth in the morning.     hydrocortisone  1 % lotion Apply 1 Application topically 2 (two)  times daily. Apply to rash on arms 118 mL 0   metFORMIN (GLUCOPHAGE) 1000 MG tablet Take 1,000 mg by mouth 2 (two) times daily.     metoprolol (LOPRESSOR) 50 MG tablet Take 50 mg by mouth 2 (two) times daily.      nitroGLYCERIN  (NITROSTAT ) 0.4 MG SL tablet Place 1 tablet (0.4 mg total) under the tongue every 5 (five) minutes as needed for chest pain. Max 3 dose 25 tablet 3   olmesartan  (BENICAR ) 40 MG tablet TAKE 1 TABLET (40 MG TOTAL)  BY MOUTH IN THE MORNING 90 tablet 3   ondansetron  (ZOFRAN ) 8 MG tablet Take 1 tablet (8 mg total) by mouth every 8 (eight) hours as needed for nausea or vomiting. Start on the third day after chemotherapy. 30 tablet 1   prochlorperazine  (COMPAZINE ) 10 MG tablet Take 1 tablet (10 mg total) by mouth every 6 (six) hours as needed for nausea or vomiting. 30 tablet 1   rosuvastatin  (CRESTOR ) 10 MG tablet Take 1 tablet (10 mg total) by mouth daily. 90 tablet 3   No current facility-administered medications for this visit.    SURGICAL HISTORY:  Past Surgical History:  Procedure Laterality Date   APPENDECTOMY     CARDIAC CATHETERIZATION  01/25/2004   Cypher DES (3.5x17mm) for PPMI to RCA (Dr. IVAR Sor)   CARDIAC CATHETERIZATION  01/03/2005   patent stent with noncritical 30% Cfx disease and 30% LAD disease (Dr. DOROTHA Schwalbe)   COLONOSCOPY  09/06/2011   Procedure: COLONOSCOPY;  Surgeon: Oneil DELENA Budge;  Location: [facility];  Service: Gastroenterology;  Laterality: N/A;   COLONOSCOPY WITH PROPOFOL  N/A 01/20/2022   Procedure: COLONOSCOPY WITH PROPOFOL ;  Surgeon: Shaaron Lamar HERO, MD;  Location: [facility];  Service: Endoscopy;  Laterality: N/A;  9:15am   NM MYOCAR PERF WALL MOTION  11/2010   bruce myoview ; normal pattern of perfusion in all regions, post-stress EF 62%; normal, low risk scan with improved perfusion from previous study   TRANSTHORACIC ECHOCARDIOGRAM  12/2011   EF=>55%, mild conc LVH; LA mildly dilated; trace MR & TR with normal RSVP; trace pulm valve regurg   VIDEO BRONCHOSCOPY WITH ENDOBRONCHIAL NAVIGATION Left 05/06/2024   Procedure: VIDEO BRONCHOSCOPY WITH ENDOBRONCHIAL NAVIGATION;  Surgeon: [name], MD;  Location: [facility];  Service: Pulmonary;  Laterality: Left;    REVIEW OF SYSTEMS:  A comprehensive review of systems was negative except for: Constitutional: positive for fatigue Neurological: positive for paresthesia   PHYSICAL EXAMINATION: General appearance: alert,  cooperative, and no distress Head: Normocephalic, without obvious abnormality, atraumatic Neck: no adenopathy, no JVD, supple, symmetrical, trachea midline, and thyroid  not enlarged, symmetric, no tenderness/mass/nodules Lymph nodes: Cervical, supraclavicular, and axillary nodes normal. Resp: clear to auscultation bilaterally Back: symmetric, no curvature. ROM normal. No CVA tenderness. Cardio: regular rate and rhythm, S1, S2 normal, no murmur, click, rub or gallop GI: soft, non-tender; bowel sounds normal; no masses,  no organomegaly Extremities: extremities normal, atraumatic, no cyanosis or edema  ECOG PERFORMANCE STATUS: 1 - Symptomatic but completely ambulatory  Blood pressure 118/74, pulse 72, temperature (!) 96.6 F (35.9 C), resp. rate 17, height 5' 6 (1.676 m), weight 167 lb 8 oz (76 kg), SpO2 99%.  LABORATORY DATA: Lab Results  Component Value Date   WBC 9.3 07/25/2024   HGB 12.7 (L) 07/25/2024   HCT 35.1 (L) 07/25/2024   MCV 90.7 07/25/2024   PLT 220 07/25/2024      Chemistry      Component Value  Date/Time   NA 135 07/18/2024 1329   K 4.4 07/18/2024 1329   CL 103 07/18/2024 1329   CO2 26 07/18/2024 1329   BUN 17 07/18/2024 1329   CREATININE 0.83 07/18/2024 1329   CREATININE 0.83 05/18/2013 1055      Component Value Date/Time   CALCIUM  9.4 07/18/2024 1329   ALKPHOS 90 07/18/2024 1329   AST 20 07/18/2024 1329   ALT 33 07/18/2024 1329   BILITOT 0.3 07/18/2024 1329       RADIOGRAPHIC STUDIES: No results found.   ASSESSMENT AND PLAN: This is a very pleasant 63 years old white male with Stage IIa (T1c, N1, M0) non-small cell lung cancer likely squamous cell carcinoma but adenocarcinoma future could not be completely excluded. He presented with left lower lobe lung nodule in addition to left hilar adenopathy diagnosed in July 2025.  He is currently undergoing a course of neoadjuvant systemic chemoimmunotherapy with carboplatin  for AUC of 6, paclitaxel  200 mg/M2  and nivolumab  360 mg IV every 3 weeks with Neulasta  support.  Status post 2 cycles.  He has been tolerating his treatment fairly well. Assessment and Plan Assessment & Plan Stage II N small cell lung cancer, squamous cell carcinoma Currently undergoing neoadjuvant chemoimmunotherapy with carboplatin , paclitaxel , and nivolumab . Completed two cycles, experiencing fatigue for three to four days post-treatment. Follow-up scan planned in three to four weeks to assess treatment response and determine surgical candidacy. - Administer third cycle of neoadjuvant chemoimmunotherapy with carboplatin , paclitaxel , and nivolumab  - Order follow-up scan on August 05, 2024, prior to surgical consultation - Coordinate surgical consultation with Dr. Kerrin on August 06, 2024  Chemotherapy-induced dermatitis of the feet Dermatitis likely secondary to nivolumab , presenting as a rash. Using lotion for symptomatic relief. - Continue using lotion for symptomatic relief of foot dermatitis  Mild anemia Mild anemia may be contributing to restless legs syndrome symptoms. No current iron supplementation. - Initiate iron supplementation every other day  Restless legs syndrome Symptoms particularly at night, affecting sleep. Mild anemia noted, which may contribute to symptoms. - Initiate iron supplementation every other day to address mild anemia and potentially alleviate restless legs syndrome  Right hip pain, possible neuropathic or bursitis etiology Intermittent severe right hip pain, described as a sensation similar to being stung by bees. Differential diagnosis includes neuropathic pain or bursitis. Symptoms have occurred a few times over the past couple of days. - Evaluate further if symptoms worsen He was advised to call immediately if he has any other concerning symptoms in the interval.  The patient voices understanding of current disease status and treatment options and is in agreement with the current  care plan.  All questions were answered. The patient knows to call the clinic with any problems, questions or concerns. We can certainly see the patient much sooner if necessary.  The total time spent in the appointment was 20 minutes including review of chart and various tests results, discussions about plan of care and coordination of care plan .  Disclaimer: This note was dictated with voice recognition software. Similar sounding words can inadvertently be transcribed and may not be corrected upon review.

## 2024-07-25 NOTE — Patient Instructions (Signed)
 [facility] - A DEPT OF [facility]  Discharge Instructions: Thank you for choosing [facility] to provide your oncology and hematology care.   If you have a lab appointment with the Cancer Center, please go directly to the Cancer Center and check in at the registration area.   Wear comfortable clothing and clothing appropriate for easy access to any Portacath or PICC line.   We strive to give you quality time with your provider. You may need to reschedule your appointment if you arrive late (15 or more minutes).  Arriving late affects you and other patients whose appointments are after yours.  Also, if you miss three or more appointments without notifying the office, you may be dismissed from the clinic at the provider's discretion.      For prescription refill requests, have your pharmacy contact our office and allow 72 hours for refills to be completed.    Today you received the following chemotherapy and/or immunotherapy agents opdivo , taxol , carboplatin       To help prevent nausea and vomiting after your treatment, we encourage you to take your nausea medication as directed.  BELOW ARE SYMPTOMS THAT SHOULD BE REPORTED IMMEDIATELY: *FEVER GREATER THAN 100.4 F (38 C) OR HIGHER *CHILLS OR SWEATING *NAUSEA AND VOMITING THAT IS NOT CONTROLLED WITH YOUR NAUSEA MEDICATION *UNUSUAL SHORTNESS OF BREATH *UNUSUAL BRUISING OR BLEEDING *URINARY PROBLEMS (pain or burning when urinating, or frequent urination) *BOWEL PROBLEMS (unusual diarrhea, constipation, pain near the anus) TENDERNESS IN MOUTH AND THROAT WITH OR WITHOUT PRESENCE OF ULCERS (sore throat, sores in mouth, or a toothache) UNUSUAL RASH, SWELLING OR PAIN  UNUSUAL VAGINAL DISCHARGE OR ITCHING   Items with * indicate a potential emergency and should be followed up as soon as possible or go to the Emergency Department if any problems should occur.  Please show the CHEMOTHERAPY ALERT CARD  or IMMUNOTHERAPY ALERT CARD at check-in to the Emergency Department and triage nurse.  Should you have questions after your visit or need to cancel or reschedule your appointment, please contact [facility] - A DEPT OF [name]  Dept: [phone]  and follow the prompts.  Office hours are 8:00 a.m. to 4:30 p.m. Monday - Friday. Please note that voicemails left after 4:00 p.m. may not be returned until the following business day.  We are closed weekends and major holidays. You have access to a nurse at all times for urgent questions. Please call the main number to the clinic Dept: [phone] and follow the prompts.   For any non-urgent questions, you may also contact your provider using MyChart. We now offer e-Visits for anyone 14 and older to request care online for non-urgent symptoms. For details visit [email or web address].   Also download the MyChart app! Go to the app store, search MyChart, open the app, select , and log in with your MyChart username and password.

## 2024-07-26 LAB — T4: T4, Total: 7.4 ug/dL (ref 4.5–12.0)

## 2024-07-27 ENCOUNTER — Inpatient Hospital Stay

## 2024-07-27 VITALS — BP 130/89 | HR 80 | Temp 98.0°F | Resp 20

## 2024-07-27 DIAGNOSIS — C3432 Malignant neoplasm of lower lobe, left bronchus or lung: Secondary | ICD-10-CM | POA: Diagnosis not present

## 2024-07-27 MED ORDER — PEGFILGRASTIM-CBQV 6 MG/0.6ML ~~LOC~~ SOSY
6.0000 mg | PREFILLED_SYRINGE | Freq: Once | SUBCUTANEOUS | Status: AC
  Administered 2024-07-27: 6 mg via SUBCUTANEOUS
  Filled 2024-07-27: qty 0.6

## 2024-07-30 ENCOUNTER — Telehealth: Payer: Self-pay | Admitting: *Deleted

## 2024-07-30 NOTE — Telephone Encounter (Signed)
 Completed [employer] Cancer Claim / Critical Illness form paperwork   Sent to provider to review, amend, sign and return to this nurse to return to claims benefit manager.

## 2024-08-01 ENCOUNTER — Inpatient Hospital Stay

## 2024-08-01 DIAGNOSIS — C3432 Malignant neoplasm of lower lobe, left bronchus or lung: Secondary | ICD-10-CM | POA: Diagnosis not present

## 2024-08-01 LAB — CBC WITH DIFFERENTIAL (CANCER CENTER ONLY)
Abs Immature Granulocytes: 0.04 K/uL (ref 0.00–0.07)
Basophils Absolute: 0 K/uL (ref 0.0–0.1)
Basophils Relative: 1 %
Eosinophils Absolute: 0.1 K/uL (ref 0.0–0.5)
Eosinophils Relative: 2 %
HCT: 34 % — ABNORMAL LOW (ref 39.0–52.0)
Hemoglobin: 12.2 g/dL — ABNORMAL LOW (ref 13.0–17.0)
Immature Granulocytes: 1 %
Lymphocytes Relative: 28 %
Lymphs Abs: 1.7 K/uL (ref 0.7–4.0)
MCH: 32.9 pg (ref 26.0–34.0)
MCHC: 35.9 g/dL (ref 30.0–36.0)
MCV: 91.6 fL (ref 80.0–100.0)
Monocytes Absolute: 0.9 K/uL (ref 0.1–1.0)
Monocytes Relative: 15 %
Neutro Abs: 3.3 K/uL (ref 1.7–7.7)
Neutrophils Relative %: 53 %
Platelet Count: 114 K/uL — ABNORMAL LOW (ref 150–400)
RBC: 3.71 MIL/uL — ABNORMAL LOW (ref 4.22–5.81)
RDW: 15.7 % — ABNORMAL HIGH (ref 11.5–15.5)
WBC Count: 6 K/uL (ref 4.0–10.5)
nRBC: 0 % (ref 0.0–0.2)

## 2024-08-01 LAB — CMP (CANCER CENTER ONLY)
ALT: 31 U/L (ref 0–44)
AST: 18 U/L (ref 15–41)
Albumin: 4 g/dL (ref 3.5–5.0)
Alkaline Phosphatase: 88 U/L (ref 38–126)
Anion gap: 8 (ref 5–15)
BUN: 18 mg/dL (ref 8–23)
CO2: 29 mmol/L (ref 22–32)
Calcium: 10.1 mg/dL (ref 8.9–10.3)
Chloride: 98 mmol/L (ref 98–111)
Creatinine: 0.82 mg/dL (ref 0.61–1.24)
GFR, Estimated: >60 mL/min (ref >60)
Glucose, Bld: 288 mg/dL — ABNORMAL HIGH (ref 70–99)
Potassium: 4.3 mmol/L (ref 3.5–5.1)
Sodium: 135 mmol/L (ref 135–145)
Total Bilirubin: 0.4 mg/dL (ref 0.0–1.2)
Total Protein: 6.7 g/dL (ref 6.5–8.1)

## 2024-08-01 NOTE — Telephone Encounter (Signed)
 Received completed [employer] Cancer Claim form.  Returned via fax [phone]).  Patient in office -with there form staff.  Copy given to him at this time.  Reports he didn't need it returned.  Advised this is the Cancer Critical Illness claim.  The first form was for short-term disability.  Copy to [facility] HIM bin for items to be scanned completes process.    [name]

## 2024-08-02 ENCOUNTER — Inpatient Hospital Stay

## 2024-08-05 ENCOUNTER — Telehealth: Payer: Self-pay

## 2024-08-05 ENCOUNTER — Other Ambulatory Visit: Payer: Self-pay | Admitting: Internal Medicine

## 2024-08-05 ENCOUNTER — Ambulatory Visit (HOSPITAL_COMMUNITY)
Admission: RE | Admit: 2024-08-05 | Discharge: 2024-08-05 | Disposition: A | Discharge status: Home / Self Care | Source: Ambulatory Visit | Attending: Internal Medicine | Admitting: Internal Medicine

## 2024-08-05 DIAGNOSIS — C3490 Malignant neoplasm of unspecified part of unspecified bronchus or lung: Secondary | ICD-10-CM | POA: Insufficient documentation

## 2024-08-05 MED ORDER — GABAPENTIN 100 MG PO CAPS: 100.0000 mg | ORAL_CAPSULE | Freq: Three times a day (TID) | ORAL | 0 refills | Status: DC

## 2024-08-05 MED ORDER — SODIUM CHLORIDE (PF) 0.9 % IJ SOLN
INTRAMUSCULAR | Status: AC
  Filled 2024-08-05: qty 50

## 2024-08-05 MED ORDER — IOHEXOL 300 MG/ML  SOLN
75.0000 mL | Freq: Once | INTRAMUSCULAR | Status: AC | PRN
Start: 2024-08-05 — End: 2024-08-05
  Administered 2024-08-05: 75 mL via INTRAVENOUS

## 2024-08-05 NOTE — Telephone Encounter (Signed)
 Daughter, Nat, called reporting that the patient has been experiencing restless legs for the past several weeks and now has numbness and tingling in his feet and fingers. Informed Dr. Sherrod, who sent a prescription for Gabapentin to [facility] in [place]. Daughter notified and aware.

## 2024-08-06 ENCOUNTER — Encounter: Payer: Self-pay | Admitting: *Deleted

## 2024-08-06 ENCOUNTER — Ambulatory Visit
Attending: Thoracic Surgery (Cardiothoracic Vascular Surgery) | Admitting: Thoracic Surgery (Cardiothoracic Vascular Surgery)

## 2024-08-06 ENCOUNTER — Encounter: Payer: Self-pay | Admitting: Thoracic Surgery (Cardiothoracic Vascular Surgery)

## 2024-08-06 ENCOUNTER — Other Ambulatory Visit: Payer: Self-pay | Admitting: *Deleted

## 2024-08-06 VITALS — BP 130/75 | HR 66 | Resp 20 | Ht 66.0 in | Wt 167.8 lb

## 2024-08-06 DIAGNOSIS — C3432 Malignant neoplasm of lower lobe, left bronchus or lung: Secondary | ICD-10-CM | POA: Diagnosis not present

## 2024-08-06 NOTE — H&P (View-Only) (Signed)
 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  Nathan [name] is a 63 year old gentleman with a history of tobacco abuse, coronary disease, MI, stent, hypertension, hyperlipidemia, type 2 diabetes, and hepatic steatosis.  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  That was a low risk study, but he was incidentally noted to have a left lower lobe lung mass.   CT of the chest showed a 2.8 cm spiculated nodule in the superior segment of the left lower lobe and a prominent left hilar lymph node.  On PET/CT both the mass and level 11 node were hypermetabolic.  There was no other evidence of disease.  There also was a groundglass opacity in the right upper lobe that has been stable for several years.   He underwent navigational bronchoscopy.  Biopsy showed non-small cell carcinoma.  He underwent neoadjuvant chemoimmunotherapy with carboplatin , paclitaxel  and nivolumab .  Completed therapy about 2 weeks ago.  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  Also had issues with a rash on his arms and feet.  No chest pain, pressure, tightness, or change in his respiratory status.  Zubrod Score: At the time of surgery this patient's most appropriate activity status/level should be described as: [x]     0    Normal activity, no symptoms []     1    Restricted in physical strenuous activity but ambulatory, able to do out light work []     2    Ambulatory and capable of self care, unable to do work activities, up and about >50 % of waking hours                              []     3    Only limited self care, in bed greater than 50% of waking hours []     4    Completely disabled, no  self care, confined to bed or chair []     5    Moribund  Past Medical History:  Diagnosis Date   Anxiety    pt denies   CAD (coronary artery disease)    Diabetes mellitus    Hepatic steatosis    Hypercholesteremia    Hypertension    Myocardial infarction (HCC) 10/18/2003    Past Surgical History:  Procedure Laterality Date   APPENDECTOMY     CARDIAC CATHETERIZATION  01/25/2004   Cypher DES (3.5x64mm) for PPMI to RCA (Dr. IVAR Sor)   CARDIAC CATHETERIZATION  01/03/2005   patent stent with noncritical 30% Cfx disease and 30% LAD disease (Dr. DOROTHA Schwalbe)   COLONOSCOPY  09/06/2011   Procedure: COLONOSCOPY;  Surgeon: Oneil DELENA Budge;  Location: [facility];  Service: Gastroenterology;  Laterality: N/A;   COLONOSCOPY WITH PROPOFOL  N/A 01/20/2022   Procedure: COLONOSCOPY WITH PROPOFOL ;  Surgeon: Shaaron Lamar HERO, MD;  Location: [facility];  Service: Endoscopy;  Laterality: N/A;  9:15am   NM MYOCAR PERF WALL MOTION  11/2010   bruce myoview ; normal pattern of perfusion in all regions, post-stress EF 62%; normal, low risk scan with improved perfusion from previous study   TRANSTHORACIC ECHOCARDIOGRAM  12/2011  EF=>55%, mild conc LVH; LA mildly dilated; trace MR & TR with normal RSVP; trace pulm valve regurg   VIDEO BRONCHOSCOPY WITH ENDOBRONCHIAL NAVIGATION Left 05/06/2024   Procedure: VIDEO BRONCHOSCOPY WITH ENDOBRONCHIAL NAVIGATION;  Surgeon: [name], MD;  Location: [facility] END[facility];  Service: Pulmonary;  Laterality: Left;    Family History  Problem Relation Age of Onset   Cancer Mother    Heart disease Mother    Cancer Sister    Heart disease Maternal Grandmother    Diabetes Paternal Grandmother    Sudden death Paternal Grandmother    Colon cancer Neg Hx     Social History Social History   Tobacco Use   Smoking status: Every Day    Current packs/day: 1.50    Average packs/day: 1.5 packs/day for 35.0 years (52.5 ttl pk-yrs)    Types: Cigarettes   Smokeless tobacco: Current     Types: Snuff   Tobacco comments:    08/06/24, had a couple, last couple weeks  Vaping Use   Vaping status: Never Used  Substance Use Topics   Alcohol use: Yes    Comment: occasionally, maybe once a month   Drug use: No    Current Outpatient Medications  Medication Sig Dispense Refill   acetaminophen  (TYLENOL ) 500 MG tablet Take 1,000 mg by mouth as needed. For pain     albuterol  (VENTOLIN  HFA) 108 (90 Base) MCG/ACT inhaler Inhale 2 puffs into the lungs every 6 (six) hours as needed for wheezing or shortness of breath. 8 g 0   aspirin EC 81 MG tablet Take 81 mg by mouth at bedtime.     augmented betamethasone dipropionate (DIPROLENE-AF) 0.05 % cream Apply 1 application. topically 2 (two) times daily as needed (skin itching/irritation.).     clopidogrel  (PLAVIX ) 75 MG tablet TAKE 1 TABLET BY MOUTH EVERY DAY (Patient taking differently: Take 75 mg by mouth at bedtime.) 90 tablet 3   doxycycline  (VIBRA -TABS) 100 MG tablet Take 1 tablet (100 mg total) by mouth 2 (two) times daily. 14 tablet 0   gabapentin (NEURONTIN) 100 MG capsule Take 1 capsule (100 mg total) by mouth 3 (three) times daily. 90 capsule 0   hydrochlorothiazide (HYDRODIURIL) 25 MG tablet Take 25 mg by mouth in the morning.     hydrocortisone  1 % lotion Apply 1 Application topically 2 (two) times daily. Apply to rash on arms 118 mL 0   metFORMIN (GLUCOPHAGE) 1000 MG tablet Take 1,000 mg by mouth 2 (two) times daily.     metoprolol (LOPRESSOR) 50 MG tablet Take 50 mg by mouth 2 (two) times daily.      nitroGLYCERIN  (NITROSTAT ) 0.4 MG SL tablet Place 1 tablet (0.4 mg total) under the tongue every 5 (five) minutes as needed for chest pain. Max 3 dose 25 tablet 3   olmesartan  (BENICAR ) 40 MG tablet TAKE 1 TABLET (40 MG TOTAL) BY MOUTH IN THE MORNING 90 tablet 3   ondansetron  (ZOFRAN ) 8 MG tablet Take 1 tablet (8 mg total) by mouth every 8 (eight) hours as needed for nausea or vomiting. Start on the third day after chemotherapy.  30 tablet 1   prochlorperazine  (COMPAZINE ) 10 MG tablet Take 1 tablet (10 mg total) by mouth every 6 (six) hours as needed for nausea or vomiting. 30 tablet 1   rosuvastatin  (CRESTOR ) 10 MG tablet Take 1 tablet (10 mg total) by mouth daily. 90 tablet 3   No current facility-administered medications for this visit.    No Known Allergies  Review of Systems  Constitutional:  Negative for unexpected weight change.  HENT:  Negative for trouble swallowing and voice change.   Eyes:  Negative for visual disturbance.  Respiratory:  Positive for cough and shortness of breath (heavy activity).   Cardiovascular:  Negative for chest pain and leg swelling.  Musculoskeletal:  Positive for arthralgias and myalgias.  Neurological:  Positive for numbness. Negative for seizures and headaches.       Peripheral neuropathy, tingling in hands and feet    BP 130/75 (BP Location: Left Arm, Patient Position: Sitting, Cuff Size: Normal)   Pulse 66   Resp 20   Ht 5' 6 (1.676 m)   Wt 167 lb 12.8 oz (76.1 kg)   SpO2 97% Comment: RA  BMI 27.08 kg/m  Physical Exam Vitals reviewed.  Constitutional:      General: He is not in acute distress.    Appearance: Normal appearance.  HENT:     Head: Normocephalic and atraumatic.  Eyes:     General: No scleral icterus.    Extraocular Movements: Extraocular movements intact.  Cardiovascular:     Rate and Rhythm: Normal rate and regular rhythm.     Heart sounds: Normal heart sounds. No murmur heard.    No friction rub. No gallop.  Pulmonary:     Effort: Pulmonary effort is normal. No respiratory distress.     Breath sounds: Normal breath sounds. No wheezing or rales.  Abdominal:     General: There is no distension.     Palpations: Abdomen is soft.  Lymphadenopathy:     Cervical: No cervical adenopathy.  Skin:    General: Skin is warm and dry.  Neurological:     General: No focal deficit present.     Mental Status: He is alert and oriented to person, place,  and time.     Cranial Nerves: No cranial nerve deficit.     Motor: No weakness.    Diagnostic Tests: NUCLEAR MEDICINE PET SKULL BASE TO THIGH   TECHNIQUE: 8.72 mCi F-18 FDG was injected intravenously. Full-ring PET imaging was performed from the skull base to thigh after the radiotracer. CT data was obtained and used for attenuation correction and anatomic localization.   Fasting blood glucose: 106 mg/dl   COMPARISON:  CT chest 05/04/2024 and older   FINDINGS: Mediastinal blood pool activity: SUV max 1.7   Liver activity: SUV max 2.3   NECK: No specific abnormal uptake identified in the neck including along lymph node change of the submandibular, posterior triangle or internal jugular region. Near symmetric uptake of the intracranial compartment.   Incidental CT findings: The parotid glands, submandibular glands and thyroid  gland are grossly preserved. Visualized paranasal sinuses and mastoid air cells are clear. Streak artifact related to the patient's dental hardware. Scattered vascular calcifications.   CHEST: There is a cavitary mass identified in the superior segment left lower lobe abutting the interlobar fissure which has maximum SUV value 9.5. Lesion on CT image 76 measures 2.8 by 2.8 cm. There is some adjacent ground-glass is caudal to this which has some minimal uptake. Spiculated does extend out to the pleura posteriorly as well. No additional areas of abnormal uptake along the lung parenchyma.   There is also focal area of abnormal uptake seen along the left hilum maximum SUV value of 5.2. Diameter of the uptake measures 11 mm on PET image 82. Likely a metastatic regional node. Corresponding focus on CT. No additional areas of abnormal uptake above blood pool  elsewhere in the axillary regions, hilum or mediastinum.   Incidental CT findings: Heart is nonenlarged. Trace pericardial fluid. Coronary artery calcifications are seen. Please correlate for other  coronary risk factors. The thoracic aorta is normal course and caliber with some mild calcified plaque. Normal caliber thoracic esophagus. No pleural effusion or pneumothorax. Breathing motion. There is a semi-solid ill-defined nodular at the right upper lung anteriorly measuring 2.7 by 2.0 cm. This does not show any abnormal uptake. This area has been present and appears relatively similar in appearance going back to a CT scan of 01/30/2021. Of note, the lesion left lung was not seen on that examination of 2022.   ABDOMEN/PELVIS: No abnormal hypermetabolic activity within the liver, pancreas, adrenal glands, or spleen. No hypermetabolic lymph nodes in the abdomen or pelvis.   Incidental CT findings: Fatty liver infiltration. Adrenal glands, pancreas, spleen are grossly preserved. Gallbladder is nondilated. Lower pole left-sided renal cyst identified. These appear simple. Photopenic on the PET dataset. Duplicated bilateral renal collecting systems, congenital variant. No definite renal or ureteral stones. Normal caliber aorta and IVC with some scattered vascular calcifications. Preserved contour to the underdistended urinary bladder. Large bowel has a normal course and caliber with scattered colonic stool. Surgical changes along the bases cecum in the appendix is not seen today. Please correlate with history. Stomach and small bowel are nondilated. Small fat containing umbilical hernia.   SKELETON: No specific abnormal uptake identified along the visualized osseous structures.   Incidental CT findings: Scattered degenerative changes. Focal right sided lower chest wall lipoma. Benign features and no abnormal uptake.   IMPRESSION: Cavitary lesion seen in the superior segment of the left lower lobe abutting the interlobar fissure and has spiculations extending out to the pleura is hypermetabolic and worrisome for neoplasm. Maximum SUV value 9.5 and dimension 2.8 x 2.8 cm.    Isolated left hypermetabolic prominent hilar lymph node worrisome for regional spread of disease.   No additional areas of abnormal radiotracer uptake.   Semi-solid ground-glass like nodular area in the anterior right upper lobe is without abnormal uptake and appears similar going back to a CT scan of 2022. Recommend continued surveillance.   Scattered colonic diverticula.  Right-sided chest wall lipoma.   Coronary artery calcifications. Please correlate for other coronary risk factors.     Electronically Signed   By: Ranell Bring M.D.   On: 05/10/2024 17:27  I reviewed his PET/CT and his CT chest done yesterday.  CT shows significant decrease in size of mass and hilar node.  No new lesions immediately apparent.  Await official radiology reading.  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   Squamous cell carcinoma left lower lobe-T1, N1, stage IIb.  Underwent neoadjuvant chemoimmunotherapy with a good partial radiographic response.  We discussed his options now which are surgical resection versus continue chemoimmunotherapy with the addition of radiation.  We discussed the relative advantages and disadvantages of each approach.  He strongly favors surgical resection.  He understands there is no guarantee of a cure with either approach.  I discussed the general nature of the procedure with Nathan [name] and his daughter.  I informed them of the need for general anesthesia, the incisions to be used, the  use of the surgical robot, the possible need for conversion to open, use of a drainage tube postoperatively, the expected hospital stay, and the overall recovery.  I  informed them of the indications, risks, benefits, and alternatives.  They understand the risks include, but not limited to death, MI, DVT, PE, bleeding, possible need for transfusion, infection, prolonged air leak, cardiac arrhythmias, as well as possibility of other unforeseeable complications.  He accepts the risks and agrees to proceed.  CAD with stent-stent placed in 2006.  No recent symptoms.  Will hold Plavix  for 5 days prior to surgery.   Plan: Robotic assisted left lower lobectomy on Monday, 08/12/2024 Hold Plavix  for 5 days prior to surgery  Nathan JAYSON Millers, MD [facility] [phone]

## 2024-08-06 NOTE — Progress Notes (Signed)
 PCP is [name], MD Referring Provider is Shannon Agent, MD  Chief Complaint  Patient presents with   Lung Cancer    Further discuss surgery    HPI: 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  Nathan [name] is a 63 year old gentleman with a history of tobacco abuse, coronary disease, MI, stent, hypertension, hyperlipidemia, type 2 diabetes, and hepatic steatosis.  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  That was a low risk study, but he was incidentally noted to have a left lower lobe lung mass.   CT of the chest showed a 2.8 cm spiculated nodule in the superior segment of the left lower lobe and a prominent left hilar lymph node.  On PET/CT both the mass and level 11 node were hypermetabolic.  There was no other evidence of disease.  There also was a groundglass opacity in the right upper lobe that has been stable for several years.   He underwent navigational bronchoscopy.  Biopsy showed non-small cell carcinoma.  He underwent neoadjuvant chemoimmunotherapy with carboplatin , paclitaxel  and nivolumab .  Completed therapy about 2 weeks ago.  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  Also had issues with a rash on his arms and feet.  No chest pain, pressure, tightness, or change in his respiratory status.  Zubrod Score: At the time of surgery this patient's most appropriate activity status/level should be described as: [x]     0    Normal activity, no symptoms []     1    Restricted in physical strenuous activity but ambulatory, able to do out light work []     2    Ambulatory and capable of self care, unable to do work activities, up and about >50 % of waking hours                              []     3    Only limited self care, in bed greater than 50% of waking hours []     4    Completely disabled, no  self care, confined to bed or chair []     5    Moribund  Past Medical History:  Diagnosis Date   Anxiety    pt denies   CAD (coronary artery disease)    Diabetes mellitus    Hepatic steatosis    Hypercholesteremia    Hypertension    Myocardial infarction (HCC) 10/18/2003    Past Surgical History:  Procedure Laterality Date   APPENDECTOMY     CARDIAC CATHETERIZATION  01/25/2004   Cypher DES (3.5x64mm) for PPMI to RCA (Dr. IVAR Sor)   CARDIAC CATHETERIZATION  01/03/2005   patent stent with noncritical 30% Cfx disease and 30% LAD disease (Dr. DOROTHA Schwalbe)   COLONOSCOPY  09/06/2011   Procedure: COLONOSCOPY;  Surgeon: Oneil DELENA Budge;  Location: [facility];  Service: Gastroenterology;  Laterality: N/A;   COLONOSCOPY WITH PROPOFOL  N/A 01/20/2022   Procedure: COLONOSCOPY WITH PROPOFOL ;  Surgeon: Shaaron Lamar HERO, MD;  Location: [facility];  Service: Endoscopy;  Laterality: N/A;  9:15am   NM MYOCAR PERF WALL MOTION  11/2010   bruce myoview ; normal pattern of perfusion in all regions, post-stress EF 62%; normal, low risk scan with improved perfusion from previous study   TRANSTHORACIC ECHOCARDIOGRAM  12/2011  EF=>55%, mild conc LVH; LA mildly dilated; trace MR & TR with normal RSVP; trace pulm valve regurg   VIDEO BRONCHOSCOPY WITH ENDOBRONCHIAL NAVIGATION Left 05/06/2024   Procedure: VIDEO BRONCHOSCOPY WITH ENDOBRONCHIAL NAVIGATION;  Surgeon: [name], MD;  Location: [facility] ENDOSCOPY;  Service: Pulmonary;  Laterality: Left;    Family History  Problem Relation Age of Onset   Cancer Mother    Heart disease Mother    Cancer Sister    Heart disease Maternal Grandmother    Diabetes Paternal Grandmother    Sudden death Paternal Grandmother    Colon cancer Neg Hx     Social History Social History   Tobacco Use   Smoking status: Every Day    Current packs/day: 1.50    Average packs/day: 1.5 packs/day for 35.0 years (52.5 ttl pk-yrs)    Types: Cigarettes   Smokeless tobacco: Current     Types: Snuff   Tobacco comments:    08/06/24, had a couple, last couple weeks  Vaping Use   Vaping status: Never Used  Substance Use Topics   Alcohol use: Yes    Comment: occasionally, maybe once a month   Drug use: No    Current Outpatient Medications  Medication Sig Dispense Refill   acetaminophen  (TYLENOL ) 500 MG tablet Take 1,000 mg by mouth as needed. For pain     albuterol  (VENTOLIN  HFA) 108 (90 Base) MCG/ACT inhaler Inhale 2 puffs into the lungs every 6 (six) hours as needed for wheezing or shortness of breath. 8 g 0   aspirin EC 81 MG tablet Take 81 mg by mouth at bedtime.     augmented betamethasone dipropionate (DIPROLENE-AF) 0.05 % cream Apply 1 application. topically 2 (two) times daily as needed (skin itching/irritation.).     clopidogrel  (PLAVIX ) 75 MG tablet TAKE 1 TABLET BY MOUTH EVERY DAY (Patient taking differently: Take 75 mg by mouth at bedtime.) 90 tablet 3   doxycycline  (VIBRA -TABS) 100 MG tablet Take 1 tablet (100 mg total) by mouth 2 (two) times daily. 14 tablet 0   gabapentin (NEURONTIN) 100 MG capsule Take 1 capsule (100 mg total) by mouth 3 (three) times daily. 90 capsule 0   hydrochlorothiazide (HYDRODIURIL) 25 MG tablet Take 25 mg by mouth in the morning.     hydrocortisone  1 % lotion Apply 1 Application topically 2 (two) times daily. Apply to rash on arms 118 mL 0   metFORMIN (GLUCOPHAGE) 1000 MG tablet Take 1,000 mg by mouth 2 (two) times daily.     metoprolol (LOPRESSOR) 50 MG tablet Take 50 mg by mouth 2 (two) times daily.      nitroGLYCERIN  (NITROSTAT ) 0.4 MG SL tablet Place 1 tablet (0.4 mg total) under the tongue every 5 (five) minutes as needed for chest pain. Max 3 dose 25 tablet 3   olmesartan  (BENICAR ) 40 MG tablet TAKE 1 TABLET (40 MG TOTAL) BY MOUTH IN THE MORNING 90 tablet 3   ondansetron  (ZOFRAN ) 8 MG tablet Take 1 tablet (8 mg total) by mouth every 8 (eight) hours as needed for nausea or vomiting. Start on the third day after chemotherapy.  30 tablet 1   prochlorperazine  (COMPAZINE ) 10 MG tablet Take 1 tablet (10 mg total) by mouth every 6 (six) hours as needed for nausea or vomiting. 30 tablet 1   rosuvastatin  (CRESTOR ) 10 MG tablet Take 1 tablet (10 mg total) by mouth daily. 90 tablet 3   No current facility-administered medications for this visit.    No Known Allergies  Review of Systems  Constitutional:  Negative for unexpected weight change.  HENT:  Negative for trouble swallowing and voice change.   Eyes:  Negative for visual disturbance.  Respiratory:  Positive for cough and shortness of breath (heavy activity).   Cardiovascular:  Negative for chest pain and leg swelling.  Musculoskeletal:  Positive for arthralgias and myalgias.  Neurological:  Positive for numbness. Negative for seizures and headaches.       Peripheral neuropathy, tingling in hands and feet    BP 130/75 (BP Location: Left Arm, Patient Position: Sitting, Cuff Size: Normal)   Pulse 66   Resp 20   Ht 5' 6 (1.676 m)   Wt 167 lb 12.8 oz (76.1 kg)   SpO2 97% Comment: RA  BMI 27.08 kg/m  Physical Exam Vitals reviewed.  Constitutional:      General: He is not in acute distress.    Appearance: Normal appearance.  HENT:     Head: Normocephalic and atraumatic.  Eyes:     General: No scleral icterus.    Extraocular Movements: Extraocular movements intact.  Cardiovascular:     Rate and Rhythm: Normal rate and regular rhythm.     Heart sounds: Normal heart sounds. No murmur heard.    No friction rub. No gallop.  Pulmonary:     Effort: Pulmonary effort is normal. No respiratory distress.     Breath sounds: Normal breath sounds. No wheezing or rales.  Abdominal:     General: There is no distension.     Palpations: Abdomen is soft.  Lymphadenopathy:     Cervical: No cervical adenopathy.  Skin:    General: Skin is warm and dry.  Neurological:     General: No focal deficit present.     Mental Status: He is alert and oriented to person, place,  and time.     Cranial Nerves: No cranial nerve deficit.     Motor: No weakness.    Diagnostic Tests: NUCLEAR MEDICINE PET SKULL BASE TO THIGH   TECHNIQUE: 8.72 mCi F-18 FDG was injected intravenously. Full-ring PET imaging was performed from the skull base to thigh after the radiotracer. CT data was obtained and used for attenuation correction and anatomic localization.   Fasting blood glucose: 106 mg/dl   COMPARISON:  CT chest 05/04/2024 and older   FINDINGS: Mediastinal blood pool activity: SUV max 1.7   Liver activity: SUV max 2.3   NECK: No specific abnormal uptake identified in the neck including along lymph node change of the submandibular, posterior triangle or internal jugular region. Near symmetric uptake of the intracranial compartment.   Incidental CT findings: The parotid glands, submandibular glands and thyroid  gland are grossly preserved. Visualized paranasal sinuses and mastoid air cells are clear. Streak artifact related to the patient's dental hardware. Scattered vascular calcifications.   CHEST: There is a cavitary mass identified in the superior segment left lower lobe abutting the interlobar fissure which has maximum SUV value 9.5. Lesion on CT image 76 measures 2.8 by 2.8 cm. There is some adjacent ground-glass is caudal to this which has some minimal uptake. Spiculated does extend out to the pleura posteriorly as well. No additional areas of abnormal uptake along the lung parenchyma.   There is also focal area of abnormal uptake seen along the left hilum maximum SUV value of 5.2. Diameter of the uptake measures 11 mm on PET image 82. Likely a metastatic regional node. Corresponding focus on CT. No additional areas of abnormal uptake above blood pool  elsewhere in the axillary regions, hilum or mediastinum.   Incidental CT findings: Heart is nonenlarged. Trace pericardial fluid. Coronary artery calcifications are seen. Please correlate for other  coronary risk factors. The thoracic aorta is normal course and caliber with some mild calcified plaque. Normal caliber thoracic esophagus. No pleural effusion or pneumothorax. Breathing motion. There is a semi-solid ill-defined nodular at the right upper lung anteriorly measuring 2.7 by 2.0 cm. This does not show any abnormal uptake. This area has been present and appears relatively similar in appearance going back to a CT scan of 01/30/2021. Of note, the lesion left lung was not seen on that examination of 2022.   ABDOMEN/PELVIS: No abnormal hypermetabolic activity within the liver, pancreas, adrenal glands, or spleen. No hypermetabolic lymph nodes in the abdomen or pelvis.   Incidental CT findings: Fatty liver infiltration. Adrenal glands, pancreas, spleen are grossly preserved. Gallbladder is nondilated. Lower pole left-sided renal cyst identified. These appear simple. Photopenic on the PET dataset. Duplicated bilateral renal collecting systems, congenital variant. No definite renal or ureteral stones. Normal caliber aorta and IVC with some scattered vascular calcifications. Preserved contour to the underdistended urinary bladder. Large bowel has a normal course and caliber with scattered colonic stool. Surgical changes along the bases cecum in the appendix is not seen today. Please correlate with history. Stomach and small bowel are nondilated. Small fat containing umbilical hernia.   SKELETON: No specific abnormal uptake identified along the visualized osseous structures.   Incidental CT findings: Scattered degenerative changes. Focal right sided lower chest wall lipoma. Benign features and no abnormal uptake.   IMPRESSION: Cavitary lesion seen in the superior segment of the left lower lobe abutting the interlobar fissure and has spiculations extending out to the pleura is hypermetabolic and worrisome for neoplasm. Maximum SUV value 9.5 and dimension 2.8 x 2.8 cm.    Isolated left hypermetabolic prominent hilar lymph node worrisome for regional spread of disease.   No additional areas of abnormal radiotracer uptake.   Semi-solid ground-glass like nodular area in the anterior right upper lobe is without abnormal uptake and appears similar going back to a CT scan of 2022. Recommend continued surveillance.   Scattered colonic diverticula.  Right-sided chest wall lipoma.   Coronary artery calcifications. Please correlate for other coronary risk factors.     Electronically Signed   By: Ranell Bring M.D.   On: 05/10/2024 17:27  I reviewed his PET/CT and his CT chest done yesterday.  CT shows significant decrease in size of mass and hilar node.  No new lesions immediately apparent.  Await official radiology reading.  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   Squamous cell carcinoma left lower lobe-T1, N1, stage IIb.  Underwent neoadjuvant chemoimmunotherapy with a good partial radiographic response.  We discussed his options now which are surgical resection versus continue chemoimmunotherapy with the addition of radiation.  We discussed the relative advantages and disadvantages of each approach.  He strongly favors surgical resection.  He understands there is no guarantee of a cure with either approach.  I discussed the general nature of the procedure with Mr. Weida and his daughter.  I informed them of the need for general anesthesia, the incisions to be used, the  use of the surgical robot, the possible need for conversion to open, use of a drainage tube postoperatively, the expected hospital stay, and the overall recovery.  I  informed them of the indications, risks, benefits, and alternatives.  They understand the risks include, but not limited to death, MI, DVT, PE, bleeding, possible need for transfusion, infection, prolonged air leak, cardiac arrhythmias, as well as possibility of other unforeseeable complications.  He accepts the risks and agrees to proceed.  CAD with stent-stent placed in 2006.  No recent symptoms.  Will hold Plavix  for 5 days prior to surgery.   Plan: Robotic assisted left lower lobectomy on Monday, 08/12/2024 Hold Plavix  for 5 days prior to surgery  Elspeth JAYSON Millers, MD [facility] [phone]

## 2024-08-07 ENCOUNTER — Other Ambulatory Visit: Payer: Self-pay

## 2024-08-07 NOTE — Pre-Procedure Instructions (Signed)
 Surgical Instructions   Your procedure is scheduled on August 12, 2024. Report to [facility] Main Entrance A at 5:30 A.M., then check in with the Admitting office. Any questions or running late day of surgery: call [phone]  Questions prior to your surgery date: call [phone], Monday-Friday, 8am-4pm. If you experience any cold or flu symptoms such as cough, fever, chills, shortness of breath, etc. between now and your scheduled surgery, please notify us  at the above number.     Remember:  Do not eat or drink after midnight the night before your surgery    Take these medicines the morning of surgery with A SIP OF WATER : gabapentin (NEURONTIN)  metoprolol (LOPRESSOR)  rosuvastatin  (CRESTOR )    May take these medicines IF NEEDED: acetaminophen  (TYLENOL )  albuterol  (VENTOLIN  HFA) inhaler - please bring inhaler with you morning of surgery nitroGLYCERIN  (NITROSTAT ) - if dose taken prior to surgery, please call [phone]   STOP taking your clopidogrel  (PLAVIX ) five days prior to surgery. Your last dose will be October 21st.  Continue taking your Aspirin through the day before surgery. DO NOT take any the morning of surgery.   One week prior to surgery, STOP taking any Aleve, Naproxen, Ibuprofen, Motrin, Advil, Goody's, BC's, all herbal medications, fish oil, and non-prescription vitamins.   WHAT DO I DO ABOUT MY DIABETES MEDICATION?   STOP taking your metFORMIN (GLUCOPHAGE) two days prior to surgery. Last dose will be October 24th.       HOW TO MANAGE YOUR DIABETES BEFORE AND AFTER SURGERY  Why is it important to control my blood sugar before and after surgery? Improving blood sugar levels before and after surgery helps healing and can limit problems. A way of improving blood sugar control is eating a healthy diet by:  Eating less sugar and carbohydrates  Increasing activity/exercise  Talking with your doctor about reaching your blood sugar goals High blood  sugars (greater than 180 mg/dL) can raise your risk of infections and slow your recovery, so you will need to focus on controlling your diabetes during the weeks before surgery. Make sure that the doctor who takes care of your diabetes knows about your planned surgery including the date and location.  How do I manage my blood sugar before surgery? Check your blood sugar at least 4 times a day, starting 2 days before surgery, to make sure that the level is not too high or low.  Check your blood sugar the morning of your surgery when you wake up and every 2 hours until you get to the Short Stay unit.  If your blood sugar is less than 70 mg/dL, you will need to treat for low blood sugar: Do not take insulin . Treat a low blood sugar (less than 70 mg/dL) with  cup of clear juice (cranberry or apple), 4 glucose tablets, OR glucose gel. Recheck blood sugar in 15 minutes after treatment (to make sure it is greater than 70 mg/dL). If your blood sugar is not greater than 70 mg/dL on recheck, call [phone] for further instructions. Report your blood sugar to the short stay nurse when you get to Short Stay.  If you are admitted to the hospital after surgery: Your blood sugar will be checked by the staff and you will probably be given insulin  after surgery (instead of oral diabetes medicines) to make sure you have good blood sugar levels. The goal for blood sugar control after surgery is 80-180 mg/dL.  Do NOT Smoke (Tobacco/Vaping) for 24 hours prior to your procedure.  If you use a CPAP at night, you may bring your mask/headgear for your overnight stay.   You will be asked to remove any contacts, glasses, piercing's, hearing aid's, dentures/partials prior to surgery. Please bring cases for these items if needed.    Patients discharged the day of surgery will not be allowed to drive home, and someone needs to stay with them for 24 hours.  SURGICAL WAITING ROOM  VISITATION Patients may have no more than 2 support people in the waiting area - these visitors may rotate.   Pre-op nurse will coordinate an appropriate time for 1 ADULT support person, who may not rotate, to accompany patient in pre-op.  Children under the age of 76 must have an adult with them who is not the patient and must remain in the main waiting area with an adult.  If the patient needs to stay at the hospital during part of their recovery, the visitor guidelines for inpatient rooms apply.  Please refer to the [facility] website for the visitor guidelines for any additional information.   If you received a COVID test during your pre-op visit  it is requested that you wear a mask when out in public, stay away from anyone that may not be feeling well and notify your surgeon if you develop symptoms. If you have been in contact with anyone that has tested positive in the last 10 days please notify you surgeon.      Pre-operative CHG Bathing Instructions   You can play a key role in reducing the risk of infection after surgery. Your skin needs to be as free of germs as possible. You can reduce the number of germs on your skin by washing with CHG (chlorhexidine  gluconate) soap before surgery. CHG is an antiseptic soap that kills germs and continues to kill germs even after washing.   DO NOT use if you have an allergy to chlorhexidine /CHG or antibacterial soaps. If your skin becomes reddened or irritated, stop using the CHG and notify one of our RNs at [phone].              TAKE A SHOWER THE NIGHT BEFORE SURGERY   Please keep in mind the following:  DO NOT shave, including legs and underarms, 48 hours prior to surgery.   You may shave your face before/day of surgery.  Place clean sheets on your bed the night before surgery Use a clean washcloth (not used since being washed) for shower. DO NOT sleep with pet's night before surgery.  CHG Shower Instructions:  Wash your face and  private area with normal soap. If you choose to wash your hair, wash first with your normal shampoo.  After you use shampoo/soap, rinse your hair and body thoroughly to remove shampoo/soap residue.  Turn the water  OFF and apply half the bottle of CHG soap to a CLEAN washcloth.  Apply CHG soap ONLY FROM YOUR NECK DOWN TO YOUR TOES (washing for 3-5 minutes)  DO NOT use CHG soap on face, private areas, open wounds, or sores.  Pay special attention to the area where your surgery is being performed.  If you are having back surgery, having someone wash your back for you may be helpful. Wait 2 minutes after CHG soap is applied, then you may rinse off the CHG soap.  Pat dry with a clean towel  Put on clean pajamas    Additional instructions for the day  of surgery: If you choose, you may shower the morning of surgery with an antibacterial soap.  DO NOT APPLY any lotions, deodorants, cologne, or perfumes.   Do not wear jewelry or makeup Do not wear nail polish, gel polish, artificial nails, or any other type of covering on natural nails (fingers and toes) Do not bring valuables to the hospital. [facility] is not responsible for valuables/personal belongings. Put on clean/comfortable clothes.  Please brush your teeth.  Ask your nurse before applying any prescription medications to the skin.

## 2024-08-08 ENCOUNTER — Encounter (HOSPITAL_COMMUNITY)
Admission: RE | Admit: 2024-08-08 | Discharge: 2024-08-08 | Disposition: A | Discharge status: Home / Self Care | Source: Ambulatory Visit | Attending: Thoracic Surgery (Cardiothoracic Vascular Surgery) | Admitting: Thoracic Surgery (Cardiothoracic Vascular Surgery)

## 2024-08-08 ENCOUNTER — Encounter (HOSPITAL_COMMUNITY): Payer: Self-pay

## 2024-08-08 ENCOUNTER — Other Ambulatory Visit: Payer: Self-pay

## 2024-08-08 VITALS — BP 111/58 | HR 72 | Temp 97.7°F | Resp 18 | Ht 66.0 in

## 2024-08-08 DIAGNOSIS — Z01818 Encounter for other preprocedural examination: Secondary | ICD-10-CM | POA: Diagnosis present

## 2024-08-08 DIAGNOSIS — I252 Old myocardial infarction: Secondary | ICD-10-CM | POA: Insufficient documentation

## 2024-08-08 DIAGNOSIS — Z955 Presence of coronary angioplasty implant and graft: Secondary | ICD-10-CM | POA: Diagnosis not present

## 2024-08-08 DIAGNOSIS — E785 Hyperlipidemia, unspecified: Secondary | ICD-10-CM | POA: Insufficient documentation

## 2024-08-08 DIAGNOSIS — J439 Emphysema, unspecified: Secondary | ICD-10-CM | POA: Diagnosis not present

## 2024-08-08 DIAGNOSIS — Z7984 Long term (current) use of oral hypoglycemic drugs: Secondary | ICD-10-CM | POA: Insufficient documentation

## 2024-08-08 DIAGNOSIS — I700 Atherosclerosis of aorta: Secondary | ICD-10-CM | POA: Diagnosis not present

## 2024-08-08 DIAGNOSIS — I493 Ventricular premature depolarization: Secondary | ICD-10-CM | POA: Diagnosis not present

## 2024-08-08 DIAGNOSIS — E119 Type 2 diabetes mellitus without complications: Secondary | ICD-10-CM | POA: Insufficient documentation

## 2024-08-08 DIAGNOSIS — I2510 Atherosclerotic heart disease of native coronary artery without angina pectoris: Secondary | ICD-10-CM | POA: Diagnosis not present

## 2024-08-08 DIAGNOSIS — I10 Essential (primary) hypertension: Secondary | ICD-10-CM | POA: Insufficient documentation

## 2024-08-08 DIAGNOSIS — F17290 Nicotine dependence, other tobacco product, uncomplicated: Secondary | ICD-10-CM | POA: Diagnosis not present

## 2024-08-08 DIAGNOSIS — C3432 Malignant neoplasm of lower lobe, left bronchus or lung: Secondary | ICD-10-CM | POA: Diagnosis not present

## 2024-08-08 HISTORY — DX: Depression, unspecified: F32.A

## 2024-08-08 HISTORY — DX: Malignant (primary) neoplasm, unspecified: C80.1

## 2024-08-08 HISTORY — DX: Pneumonia, unspecified organism: J18.9

## 2024-08-08 HISTORY — DX: Chronic obstructive pulmonary disease, unspecified: J44.9

## 2024-08-08 LAB — CBC
HCT: 36.6 % — ABNORMAL LOW (ref 39.0–52.0)
Hemoglobin: 12.3 g/dL — ABNORMAL LOW (ref 13.0–17.0)
MCH: 33.1 pg (ref 26.0–34.0)
MCHC: 33.6 g/dL (ref 30.0–36.0)
MCV: 98.4 fL (ref 80.0–100.0)
Platelets: 112 K/uL — ABNORMAL LOW (ref 150–400)
RBC: 3.72 MIL/uL — ABNORMAL LOW (ref 4.22–5.81)
RDW: 17.1 % — ABNORMAL HIGH (ref 11.5–15.5)
WBC: 14.2 K/uL — ABNORMAL HIGH (ref 4.0–10.5)
nRBC: 0 % (ref 0.0–0.2)

## 2024-08-08 LAB — COMPREHENSIVE METABOLIC PANEL WITH GFR
ALT: 28 U/L (ref 0–44)
AST: 21 U/L (ref 15–41)
Albumin: 3.7 g/dL (ref 3.5–5.0)
Alkaline Phosphatase: 92 U/L (ref 38–126)
Anion gap: 13 (ref 5–15)
BUN: 13 mg/dL (ref 8–23)
CO2: 22 mmol/L (ref 22–32)
Calcium: 9 mg/dL (ref 8.9–10.3)
Chloride: 103 mmol/L (ref 98–111)
Creatinine, Ser: 0.86 mg/dL (ref 0.61–1.24)
GFR, Estimated: >60 mL/min (ref >60)
Glucose, Bld: 159 mg/dL — ABNORMAL HIGH (ref 70–99)
Potassium: 4.1 mmol/L (ref 3.5–5.1)
Sodium: 138 mmol/L (ref 135–145)
Total Bilirubin: 0.3 mg/dL (ref 0.0–1.2)
Total Protein: 7.1 g/dL (ref 6.5–8.1)

## 2024-08-08 LAB — TYPE AND SCREEN
ABO/RH(D): B POS
Antibody Screen: NEGATIVE

## 2024-08-08 LAB — HEMOGLOBIN A1C
Hgb A1c MFr Bld: 6.3 % — ABNORMAL HIGH (ref 4.8–5.6)
Mean Plasma Glucose: 134.11 mg/dL

## 2024-08-08 LAB — PROTIME-INR
INR: 1 (ref 0.8–1.2)
Prothrombin Time: 13.8 s (ref 11.4–15.2)

## 2024-08-08 LAB — URINALYSIS, ROUTINE W REFLEX MICROSCOPIC
Bilirubin Urine: NEGATIVE
Glucose, UA: NEGATIVE mg/dL
Ketones, ur: NEGATIVE mg/dL
Leukocytes,Ua: NEGATIVE
Nitrite: NEGATIVE
Protein, ur: NEGATIVE mg/dL
Specific Gravity, Urine: 1.025 (ref 1.005–1.030)
pH: 5 (ref 5.0–8.0)

## 2024-08-08 LAB — GLUCOSE, CAPILLARY: Glucose-Capillary: 149 mg/dL — ABNORMAL HIGH (ref 70–99)

## 2024-08-08 LAB — SURGICAL PCR SCREEN
MRSA, PCR: NEGATIVE
Staphylococcus aureus: NEGATIVE

## 2024-08-08 LAB — APTT: aPTT: 25 s (ref 24–36)

## 2024-08-08 NOTE — Progress Notes (Signed)
 PCP - Dr. Norleen General Cardiologist - Dr. Vinie Maxcy - last office visit 04/09/2024  PPM/ICD - Denies Device Orders - n/a Rep Notified - n/a  Chest x-ray - to be completed DOS EKG - 08/08/2024 Stress Test - 03/25/2024 ECHO - 01/12/2012 Cardiac Cath - 01/25/2004 - stent RCA  Sleep Study - Denies CPAP - n/a  Pt is DM2. He seldom checks his blood sugar, but when he was checking it regularly, normal fasting was 110s. CBG at pre-op appointment 149. A1c result pending  Last dose of GLP1 agonist- n/a GLP1 instructions: n/a  Blood Thinner Instructions: Pt instructed to stop Plavix  5 days prior to surgery. Last dose was 10/21 Aspirin Instructions: Pt instructed to continue taking ASA through the day before surgery and none the morning of surgery  NPO after midnight  COVID TEST- n/a   Anesthesia review: Yes. Hx of HTN, MI/CAD with DES in RCA, DM  Patient denies shortness of breath, fever, cough and chest pain at PAT appointment. Pt denies any respiratory illness/infection in the last two months.   All instructions explained to the patient, with a verbal understanding of the material. Patient agrees to go over the instructions while at home for a better understanding. Patient also instructed to self quarantine after being tested for COVID-19. The opportunity to ask questions was provided.

## 2024-08-08 NOTE — Progress Notes (Signed)
 Bernardino Sprang, RN with [facility], made aware of patients abnormal UA result.

## 2024-08-09 NOTE — Anesthesia Preprocedure Evaluation (Addendum)
 Anesthesia Evaluation  Patient identified by MRN, date of birth, ID band Patient awake    Reviewed: Allergy & Precautions, NPO status , Patient's Chart, lab work & pertinent test results, reviewed documented beta blocker date and time   History of Anesthesia Complications Negative for: history of anesthetic complications  Airway Mallampati: III  TM Distance: >3 FB     Dental no notable dental hx.    Pulmonary neg shortness of breath, pneumonia, resolved, COPD,  COPD inhaler, neg recent URI, Current Smoker and Patient abstained from smoking.   breath sounds clear to auscultation       Cardiovascular hypertension, + CAD, + Past MI and + Cardiac Stents  (-) CABG, (-) Peripheral Vascular Disease, (-) CHF and (-) DVT  Rhythm:Regular Rate:Normal     Neuro/Psych neg Seizures PSYCHIATRIC DISORDERS Anxiety Depression       GI/Hepatic ,neg GERD  ,,(+) neg Cirrhosis        Endo/Other  diabetes, Type 2    Renal/GU Renal disease     Musculoskeletal   Abdominal   Peds  Hematology  (+) Blood dyscrasia, anemia   Anesthesia Other Findings   Reproductive/Obstetrics                              Anesthesia Physical Anesthesia Plan  ASA: 3  Anesthesia Plan: General   Post-op Pain Management: Ofirmev  IV (intra-op)*   Induction: Intravenous  PONV Risk Score and Plan: 2 and Ondansetron  and Dexamethasone   Airway Management Planned: Oral ETT  Additional Equipment: Arterial line  Intra-op Plan:   Post-operative Plan: Extubation in OR  Informed Consent: I have reviewed the patients History and Physical, chart, labs and discussed the procedure including the risks, benefits and alternatives for the proposed anesthesia with the patient or authorized representative who has indicated his/her understanding and acceptance.     Dental advisory given  Plan Discussed with: CRNA  Anesthesia Plan Comments:  (PAT note written 08/09/2024 by Allison Zelenak, PA-C.  )         Anesthesia Quick Evaluation

## 2024-08-09 NOTE — Progress Notes (Signed)
 Anesthesia Chart Review:  Case: [id number] Date/Time: 08/12/24 0715   Procedure: LOBECTOMY, LUNG, ROBOT-ASSISTED, USING VATS (Left: Chest) - Robotic left lower lobectomy   Anesthesia type: General   Diagnosis: Squamous cell carcinoma lung, left (HCC) [C34.92]   Pre-op diagnosis:      Squamous cell carcinoma, left lower lobe     s/p neoadjunct therapy   [facility] ROOM [facility] / [facility]   Surgeons: Kerrin Elspeth BROCKS, MD       DISCUSSION: Patient is a 63 year old male scheduled for the above procedure.   He follows with cardiology for history of HLD, HTN, DM2, ongoing tobacco abuse, CAD s/p inferior MI with stent to the mid RCA in 2005.  He was last seen by Dr. 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  Nuclear stress 03/25/2024 was negative for ischemia and showed LVEF 72%.  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  He was referred to pulmonology for further evaluation. S/p bronchoscopy on 05/06/2024. LLL FNA + for non-small cell carcinoma.  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 Completed therapy in early October.  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  Dr. Charlott advised to hold Plavix  for 5 days which cardiology had him do for recent bronchoscopy.  Last dose 08/06/2024.  He is scheduled for chest x-ray on the day of surgery.  A1c 6.3%.  Platelet count 113K, but overall consistent with more recent labs since he underwent chemoimmunotherapy. WBC 14.2.  He denied fever, cough, dyspnea, chest pain at PAT visit.  He has chest x-ray ordered per surgeon for the day of surgery.   Anesthesia team to evaluate on the day of surgery.   VS: BP (!) 111/58   Pulse 72   Temp 36.5 C   Resp 18   Ht 5' 6 (1.676 m)   SpO2 100%   BMI 27.08 kg/m    PROVIDERS: [name], MD is PCP  Mona Vinie BROCKS, MD is cardiologist Sherrod Sherrod is HEM-ONC Shelah Charleston, MD is pulmonologist Shannon Agent, MD is RAD-ONC  LABS: Preoperative labs noted. PLT count 112, previously as low as 108K on 07/18/2024, but prior to 07/2024 was morst > 150K. WBC 14.2K.  (all labs ordered are listed, but only abnormal results are displayed)  Labs Reviewed  GLUCOSE, CAPILLARY - Abnormal; Notable for the following components:      Result Value   Glucose-Capillary 149 (*)    All other components within normal limits  CBC - Abnormal; Notable for the following components:   WBC 14.2 (*)    RBC 3.72 (*)    Hemoglobin 12.3 (*)    HCT 36.6 (*)    RDW 17.1 (*)    Platelets 112 (*)    All other components within normal limits  COMPREHENSIVE METABOLIC PANEL WITH GFR - Abnormal; Notable for the following components:   Glucose, Bld 159 (*)    All other components within normal limits  URINALYSIS, ROUTINE W REFLEX MICROSCOPIC - Abnormal; Notable for the following components:   APPearance CLOUDY (*)    Hgb urine dipstick SMALL (*)    Bacteria, UA RARE (*)    All other components within normal limits  HEMOGLOBIN A1C - Abnormal; Notable for the following components:   Hgb A1c MFr Bld 6.3 (*)  All other components within normal limits  SURGICAL PCR SCREEN  PROTIME-INR  APTT  TYPE AND SCREEN   PFTs 05/27/2024: FVC 2.95 (72%) FEV1 1.92 (63%) FEV1 2.07 (67%) postbronchodilator DLCO 22.66 (94%)  IMAGES: CT chest 08/05/2024 IMPRESSION: 1. Interval response to therapy. Left lower lobe nodule has markedly decreased in size. No evidence of metastatic disease. 2. Ground-glass mass in the anterior segment right upper lobe, stable. Recommend continued attention on follow-up as low-grade adenocarcinoma can have this appearance. 3. Aortic atherosclerosis (ICD10-I70.0). Coronary artery calcification. 4.  Emphysema (ICD10-J43.9).  MRI brain  05/18/2024: IMPRESSION: No evidence of intracranial metastatic disease.    EKG: 08/08/2024: Sinus rhythm with frequent Premature ventricular complexes Otherwise normal ECG No significant change since last tracing Confirmed by Raford Riggs ([id number]) on 08/08/2024 7:24:24 PM   CV: Nuclear stress 03/25/2024:   The study is normal. The study is low risk.   No ST deviation was noted.   LV perfusion is normal. There is no evidence of ischemia. There is no evidence of infarction.   Left ventricular function is normal. Nuclear stress EF: 72%. The left ventricular ejection fraction is hyperdynamic (>65%). End diastolic cavity size is normal. End systolic cavity size is normal.   CT images were obtained for attenuation correction and were examined for the presence of coronary calcium  when appropriate.   Coronary calcium  assessment not performed due to prior revascularization.   Prior study available for comparison from 04/18/2022.   Electronically signed by Lonni Nanas, MD   Fixed inferior perfusion defect with normal wall motion, consistent with artifact LVEF 72% Low risk study     No significant valvular disease on 2013 TTE.    Past Medical History:  Diagnosis Date   Anxiety    pt denies   CAD (coronary artery disease)    Cancer (HCC)    Lung Cancer   COPD (chronic obstructive pulmonary disease) (HCC)    Mild   Depression    Diabetes mellitus    Hepatic steatosis    Hypercholesteremia    Hypertension    Myocardial infarction (HCC) 10/18/2003   Pneumonia     Past Surgical History:  Procedure Laterality Date   APPENDECTOMY     CARDIAC CATHETERIZATION  01/25/2004   Cypher DES (3.5x31mm) for PPMI to RCA (Dr. IVAR Sor)   CARDIAC CATHETERIZATION  01/03/2005   patent stent with noncritical 30% Cfx disease and 30% LAD disease (Dr. DOROTHA Schwalbe)   COLONOSCOPY  09/06/2011   Procedure: COLONOSCOPY;  Surgeon: Oneil DELENA Budge;  [facility]: [facility];  Service: Gastroenterology;   Laterality: N/A;   COLONOSCOPY WITH PROPOFOL  N/A 01/20/2022   Procedure: COLONOSCOPY WITH PROPOFOL ;  Surgeon: Shaaron Lamar HERO, MD;  Location: [facility];  Service: Endoscopy;  Laterality: N/A;  9:15am   NM MYOCAR PERF WALL MOTION  11/2010   bruce myoview ; normal pattern of perfusion in all regions, post-stress EF 62%; normal, low risk scan with improved perfusion from previous study   TRANSTHORACIC ECHOCARDIOGRAM  12/2011   EF=>55%, mild conc LVH; LA mildly dilated; trace MR & TR with normal RSVP; trace pulm valve regurg   VIDEO BRONCHOSCOPY WITH ENDOBRONCHIAL NAVIGATION Left 05/06/2024   Procedure: VIDEO BRONCHOSCOPY WITH ENDOBRONCHIAL NAVIGATION;  Surgeon: [name], MD;  Location[facility];  Service: Pulmonary;  Laterality: Left;    MEDICATIONS:  acetaminophen  (TYLENOL ) 500 MG tablet   albuterol  (VENTOLIN  HFA) 108 (90 Base) MCG/ACT inhaler   aspirin EC 81 MG tablet   augmented betamethasone dipropionate (  DIPROLENE-AF) 0.05 % cream   clopidogrel  (PLAVIX ) 75 MG tablet   ferrous sulfate 325 (65 FE) MG tablet   gabapentin (NEURONTIN) 100 MG capsule   hydrochlorothiazide (HYDRODIURIL) 25 MG tablet   hydrocortisone  1 % lotion   metFORMIN (GLUCOPHAGE) 1000 MG tablet   metoprolol (LOPRESSOR) 50 MG tablet   nitroGLYCERIN  (NITROSTAT ) 0.4 MG SL tablet   olmesartan  (BENICAR ) 40 MG tablet   ondansetron  (ZOFRAN ) 8 MG tablet   prochlorperazine  (COMPAZINE ) 10 MG tablet   rosuvastatin  (CRESTOR ) 20 MG tablet   No current facility-administered medications for this encounter.    Isaiah Ruder, PA-C Surgical Short Stay/Anesthesiology [facility] [address] Phone [phone] [facility] Phone [phone] 08/09/2024 11:31 AM

## 2024-08-12 ENCOUNTER — Encounter (HOSPITAL_COMMUNITY): Payer: Self-pay | Admitting: Thoracic Surgery (Cardiothoracic Vascular Surgery)

## 2024-08-12 ENCOUNTER — Inpatient Hospital Stay (HOSPITAL_COMMUNITY): Payer: Self-pay

## 2024-08-12 ENCOUNTER — Inpatient Hospital Stay (HOSPITAL_COMMUNITY): Payer: Self-pay | Admitting: Certified Registered Nurse Anesthetist

## 2024-08-12 ENCOUNTER — Other Ambulatory Visit: Payer: Self-pay

## 2024-08-12 ENCOUNTER — Inpatient Hospital Stay (HOSPITAL_COMMUNITY)
Admission: RE | Admit: 2024-08-12 | Discharge: 2024-08-15 | DRG: 164 | Disposition: A | Discharge status: Home / Self Care | Payer: Self-pay | Attending: Thoracic Surgery (Cardiothoracic Vascular Surgery) | Admitting: Thoracic Surgery (Cardiothoracic Vascular Surgery)

## 2024-08-12 ENCOUNTER — Encounter (HOSPITAL_COMMUNITY)
Admission: RE | Disposition: A | Payer: Self-pay | Source: Home / Self Care | Attending: Thoracic Surgery (Cardiothoracic Vascular Surgery)

## 2024-08-12 DIAGNOSIS — I2510 Atherosclerotic heart disease of native coronary artery without angina pectoris: Secondary | ICD-10-CM | POA: Diagnosis present

## 2024-08-12 DIAGNOSIS — Z902 Acquired absence of lung [part of]: Secondary | ICD-10-CM

## 2024-08-12 DIAGNOSIS — C3432 Malignant neoplasm of lower lobe, left bronchus or lung: Secondary | ICD-10-CM

## 2024-08-12 DIAGNOSIS — Z7902 Long term (current) use of antithrombotics/antiplatelets: Secondary | ICD-10-CM

## 2024-08-12 DIAGNOSIS — E119 Type 2 diabetes mellitus without complications: Secondary | ICD-10-CM

## 2024-08-12 DIAGNOSIS — Z9889 Other specified postprocedural states: Principal | ICD-10-CM

## 2024-08-12 DIAGNOSIS — I252 Old myocardial infarction: Secondary | ICD-10-CM

## 2024-08-12 DIAGNOSIS — J4489 Other specified chronic obstructive pulmonary disease: Secondary | ICD-10-CM | POA: Diagnosis present

## 2024-08-12 DIAGNOSIS — J9811 Atelectasis: Secondary | ICD-10-CM | POA: Diagnosis present

## 2024-08-12 DIAGNOSIS — Z9221 Personal history of antineoplastic chemotherapy: Secondary | ICD-10-CM

## 2024-08-12 DIAGNOSIS — Z955 Presence of coronary angioplasty implant and graft: Secondary | ICD-10-CM

## 2024-08-12 DIAGNOSIS — I493 Ventricular premature depolarization: Secondary | ICD-10-CM | POA: Diagnosis present

## 2024-08-12 DIAGNOSIS — E7800 Pure hypercholesterolemia, unspecified: Secondary | ICD-10-CM | POA: Diagnosis present

## 2024-08-12 DIAGNOSIS — F17210 Nicotine dependence, cigarettes, uncomplicated: Secondary | ICD-10-CM | POA: Diagnosis present

## 2024-08-12 DIAGNOSIS — I10 Essential (primary) hypertension: Secondary | ICD-10-CM | POA: Diagnosis present

## 2024-08-12 DIAGNOSIS — Z7984 Long term (current) use of oral hypoglycemic drugs: Secondary | ICD-10-CM

## 2024-08-12 DIAGNOSIS — E785 Hyperlipidemia, unspecified: Secondary | ICD-10-CM | POA: Diagnosis present

## 2024-08-12 DIAGNOSIS — K760 Fatty (change of) liver, not elsewhere classified: Secondary | ICD-10-CM | POA: Diagnosis present

## 2024-08-12 DIAGNOSIS — Z833 Family history of diabetes mellitus: Secondary | ICD-10-CM

## 2024-08-12 DIAGNOSIS — E1140 Type 2 diabetes mellitus with diabetic neuropathy, unspecified: Secondary | ICD-10-CM | POA: Diagnosis present

## 2024-08-12 DIAGNOSIS — F17290 Nicotine dependence, other tobacco product, uncomplicated: Secondary | ICD-10-CM | POA: Diagnosis present

## 2024-08-12 DIAGNOSIS — Z7982 Long term (current) use of aspirin: Secondary | ICD-10-CM

## 2024-08-12 DIAGNOSIS — Z8249 Family history of ischemic heart disease and other diseases of the circulatory system: Secondary | ICD-10-CM

## 2024-08-12 DIAGNOSIS — J439 Emphysema, unspecified: Secondary | ICD-10-CM | POA: Diagnosis present

## 2024-08-12 DIAGNOSIS — Z79899 Other long term (current) drug therapy: Secondary | ICD-10-CM

## 2024-08-12 HISTORY — PX: SENTINEL NODE BIOPSY: SHX6608

## 2024-08-12 HISTORY — PX: INTERCOSTAL NERVE BLOCK: SHX5021

## 2024-08-12 HISTORY — PX: LOBECTOMY, LUNG, ROBOT-ASSISTED, USING VATS: SHX7607

## 2024-08-12 LAB — MRSA NEXT GEN BY PCR, NASAL: MRSA by PCR Next Gen: NOT DETECTED

## 2024-08-12 LAB — GLUCOSE, CAPILLARY
Glucose-Capillary: 159 mg/dL — ABNORMAL HIGH (ref 70–99)
Glucose-Capillary: 192 mg/dL — ABNORMAL HIGH (ref 70–99)
Glucose-Capillary: 221 mg/dL — ABNORMAL HIGH (ref 70–99)
Glucose-Capillary: 294 mg/dL — ABNORMAL HIGH (ref 70–99)

## 2024-08-12 LAB — ABO/RH: ABO/RH(D): B POS

## 2024-08-12 SURGERY — LOBECTOMY, LUNG, ROBOT-ASSISTED, USING VATS
Anesthesia: General | Site: Chest | Laterality: Left

## 2024-08-12 MED ORDER — 0.9 % SODIUM CHLORIDE (POUR BTL) OPTIME
TOPICAL | Status: DC | PRN
Start: 2024-08-12 — End: 2024-08-12
  Administered 2024-08-12: 2000 mL

## 2024-08-12 MED ORDER — MORPHINE SULFATE (PF) 2 MG/ML IV SOLN
2.0000 mg | INTRAVENOUS | Status: DC | PRN
  Administered 2024-08-12 (×2): 2 mg via INTRAVENOUS
  Filled 2024-08-12 (×4): qty 1

## 2024-08-12 MED ORDER — PROPOFOL 500 MG/50ML IV EMUL
INTRAVENOUS | Status: DC | PRN
  Administered 2024-08-12: 50 ug/kg/min via INTRAVENOUS

## 2024-08-12 MED ORDER — LACTATED RINGERS IV SOLN: INTRAVENOUS | Status: DC | PRN

## 2024-08-12 MED ORDER — PHENYLEPHRINE HCL-NACL 20-0.9 MG/250ML-% IV SOLN
INTRAVENOUS | Status: DC | PRN
  Administered 2024-08-12: 25 ug/min via INTRAVENOUS

## 2024-08-12 MED ORDER — ACETAMINOPHEN 10 MG/ML IV SOLN: 1000.0000 mg | Freq: Once | INTRAVENOUS | Status: DC | PRN

## 2024-08-12 MED ORDER — MIDAZOLAM HCL 2 MG/2ML IJ SOLN
INTRAMUSCULAR | Status: AC
  Filled 2024-08-12: qty 2

## 2024-08-12 MED ORDER — CEFAZOLIN SODIUM-DEXTROSE 2-3 GM-%(50ML) IV SOLR
INTRAVENOUS | Status: DC | PRN
  Administered 2024-08-12: 2 g via INTRAVENOUS

## 2024-08-12 MED ORDER — ORAL CARE MOUTH RINSE: 15.0000 mL | Freq: Once | OROMUCOSAL | Status: AC

## 2024-08-12 MED ORDER — BUPIVACAINE HCL (PF) 0.5 % IJ SOLN
INTRAMUSCULAR | Status: AC
Start: 2024-08-12 — End: 2024-08-12
  Filled 2024-08-12: qty 30

## 2024-08-12 MED ORDER — KETOROLAC TROMETHAMINE 15 MG/ML IJ SOLN
15.0000 mg | Freq: Four times a day (QID) | INTRAMUSCULAR | Status: DC
  Administered 2024-08-12 – 2024-08-13 (×3): 15 mg via INTRAVENOUS
  Filled 2024-08-12 (×3): qty 1

## 2024-08-12 MED ORDER — PHENYLEPHRINE 80 MCG/ML (10ML) SYRINGE FOR IV PUSH (FOR BLOOD PRESSURE SUPPORT)
PREFILLED_SYRINGE | INTRAVENOUS | Status: AC
  Filled 2024-08-12: qty 10

## 2024-08-12 MED ORDER — ENOXAPARIN SODIUM 40 MG/0.4ML IJ SOSY
40.0000 mg | PREFILLED_SYRINGE | Freq: Every day | INTRAMUSCULAR | Status: DC
  Administered 2024-08-13 – 2024-08-14 (×3): 40 mg via SUBCUTANEOUS
  Filled 2024-08-12 (×3): qty 0.4

## 2024-08-12 MED ORDER — ROCURONIUM BROMIDE 10 MG/ML (PF) SYRINGE
PREFILLED_SYRINGE | INTRAVENOUS | Status: AC
  Filled 2024-08-12: qty 10

## 2024-08-12 MED ORDER — ACETAMINOPHEN 500 MG PO TABS
1000.0000 mg | ORAL_TABLET | Freq: Four times a day (QID) | ORAL | Status: DC
  Administered 2024-08-12 – 2024-08-15 (×11): 1000 mg via ORAL
  Filled 2024-08-12 (×11): qty 2

## 2024-08-12 MED ORDER — ONDANSETRON HCL 4 MG/2ML IJ SOLN
INTRAMUSCULAR | Status: AC
  Filled 2024-08-12: qty 4

## 2024-08-12 MED ORDER — TRAMADOL HCL 50 MG PO TABS: 50.0000 mg | ORAL_TABLET | Freq: Four times a day (QID) | ORAL | Status: DC | PRN

## 2024-08-12 MED ORDER — CEFAZOLIN SODIUM-DEXTROSE 2-4 GM/100ML-% IV SOLN
2.0000 g | Freq: Three times a day (TID) | INTRAVENOUS | Status: AC
  Administered 2024-08-12 – 2024-08-13 (×2): 2 g via INTRAVENOUS
  Filled 2024-08-12 (×2): qty 100

## 2024-08-12 MED ORDER — INSULIN ASPART 100 UNIT/ML IJ SOLN
0.0000 [IU] | Freq: Three times a day (TID) | INTRAMUSCULAR | Status: DC
  Administered 2024-08-12 – 2024-08-14 (×5): 4 [IU] via SUBCUTANEOUS
  Administered 2024-08-14: 2 [IU] via SUBCUTANEOUS
  Filled 2024-08-12: qty 4

## 2024-08-12 MED ORDER — SUCCINYLCHOLINE CHLORIDE 200 MG/10ML IV SOSY
PREFILLED_SYRINGE | INTRAVENOUS | Status: AC
  Filled 2024-08-12: qty 10

## 2024-08-12 MED ORDER — EPHEDRINE 5 MG/ML INJ
INTRAVENOUS | Status: AC
  Filled 2024-08-12: qty 10

## 2024-08-12 MED ORDER — PANTOPRAZOLE SODIUM 40 MG PO TBEC
40.0000 mg | DELAYED_RELEASE_TABLET | Freq: Every day | ORAL | Status: DC
  Administered 2024-08-13 – 2024-08-15 (×3): 40 mg via ORAL
  Filled 2024-08-12 (×3): qty 1

## 2024-08-12 MED ORDER — SUGAMMADEX SODIUM 200 MG/2ML IV SOLN
INTRAVENOUS | Status: DC | PRN
  Administered 2024-08-12: 154.2 mg via INTRAVENOUS

## 2024-08-12 MED ORDER — BUPIVACAINE LIPOSOME 1.3 % IJ SUSP
INTRAMUSCULAR | Status: AC
  Filled 2024-08-12: qty 20

## 2024-08-12 MED ORDER — ALBUTEROL SULFATE (2.5 MG/3ML) 0.083% IN NEBU: 2.5000 mg | INHALATION_SOLUTION | Freq: Four times a day (QID) | RESPIRATORY_TRACT | Status: DC | PRN

## 2024-08-12 MED ORDER — LIDOCAINE 2% (20 MG/ML) 5 ML SYRINGE
INTRAMUSCULAR | Status: AC
  Filled 2024-08-12: qty 10

## 2024-08-12 MED ORDER — DEXAMETHASONE SOD PHOSPHATE PF 10 MG/ML IJ SOLN
INTRAMUSCULAR | Status: DC | PRN
  Administered 2024-08-12: 10 mg via INTRAVENOUS

## 2024-08-12 MED ORDER — BISACODYL 5 MG PO TBEC
10.0000 mg | DELAYED_RELEASE_TABLET | Freq: Every day | ORAL | Status: DC
  Administered 2024-08-14 – 2024-08-15 (×2): 10 mg via ORAL
  Filled 2024-08-12 (×4): qty 2

## 2024-08-12 MED ORDER — ROCURONIUM BROMIDE 10 MG/ML (PF) SYRINGE
PREFILLED_SYRINGE | INTRAVENOUS | Status: DC | PRN
  Administered 2024-08-12: 100 mg via INTRAVENOUS

## 2024-08-12 MED ORDER — ONDANSETRON HCL 4 MG/2ML IJ SOLN: 4.0000 mg | Freq: Four times a day (QID) | INTRAMUSCULAR | Status: DC | PRN

## 2024-08-12 MED ORDER — FENTANYL CITRATE (PF) 100 MCG/2ML IJ SOLN: 25.0000 ug | INTRAMUSCULAR | Status: DC | PRN

## 2024-08-12 MED ORDER — PHENYLEPHRINE 80 MCG/ML (10ML) SYRINGE FOR IV PUSH (FOR BLOOD PRESSURE SUPPORT)
PREFILLED_SYRINGE | INTRAVENOUS | Status: DC | PRN
  Administered 2024-08-12: 40 ug via INTRAVENOUS
  Administered 2024-08-12: 80 ug via INTRAVENOUS
  Administered 2024-08-12: 40 ug via INTRAVENOUS

## 2024-08-12 MED ORDER — SODIUM CHLORIDE 0.9% IV SOLUTION
INTRAVENOUS | Status: AC | PRN
  Administered 2024-08-12: 1000 mL via INTRAMUSCULAR

## 2024-08-12 MED ORDER — GABAPENTIN 100 MG PO CAPS
100.0000 mg | ORAL_CAPSULE | Freq: Three times a day (TID) | ORAL | Status: DC
  Administered 2024-08-12 – 2024-08-15 (×9): 100 mg via ORAL
  Filled 2024-08-12 (×9): qty 1

## 2024-08-12 MED ORDER — MIDAZOLAM HCL (PF) 2 MG/2ML IJ SOLN
INTRAMUSCULAR | Status: DC | PRN
  Administered 2024-08-12: 2 mg via INTRAVENOUS

## 2024-08-12 MED ORDER — ACETAMINOPHEN 160 MG/5ML PO SOLN: 1000.0000 mg | Freq: Four times a day (QID) | ORAL | Status: DC

## 2024-08-12 MED ORDER — ASPIRIN 81 MG PO TBEC
81.0000 mg | DELAYED_RELEASE_TABLET | Freq: Every morning | ORAL | Status: DC
Start: 2024-08-13 — End: 2024-08-15
  Administered 2024-08-13 – 2024-08-15 (×3): 81 mg via ORAL
  Filled 2024-08-12 (×3): qty 1

## 2024-08-12 MED ORDER — OXYCODONE HCL 5 MG PO TABS: 5.0000 mg | ORAL_TABLET | Freq: Once | ORAL | Status: DC | PRN

## 2024-08-12 MED ORDER — LIDOCAINE 2% (20 MG/ML) 5 ML SYRINGE
INTRAMUSCULAR | Status: DC | PRN
  Administered 2024-08-12: 100 mg via INTRAVENOUS

## 2024-08-12 MED ORDER — CEFAZOLIN SODIUM-DEXTROSE 2-4 GM/100ML-% IV SOLN
2.0000 g | INTRAVENOUS | Status: DC
  Filled 2024-08-12: qty 100

## 2024-08-12 MED ORDER — SENNOSIDES-DOCUSATE SODIUM 8.6-50 MG PO TABS
1.0000 | ORAL_TABLET | Freq: Every day | ORAL | Status: DC
  Administered 2024-08-13 – 2024-08-14 (×2): 1 via ORAL
  Filled 2024-08-12 (×2): qty 1

## 2024-08-12 MED ORDER — CHLORHEXIDINE GLUCONATE 0.12 % MT SOLN
15.0000 mL | Freq: Once | OROMUCOSAL | Status: AC
  Administered 2024-08-12: 15 mL via OROMUCOSAL
  Filled 2024-08-12: qty 15

## 2024-08-12 MED ORDER — ONDANSETRON HCL 4 MG/2ML IJ SOLN: 4.0000 mg | Freq: Once | INTRAMUSCULAR | Status: DC | PRN

## 2024-08-12 MED ORDER — OXYCODONE HCL 5 MG/5ML PO SOLN: 5.0000 mg | Freq: Once | ORAL | Status: DC | PRN

## 2024-08-12 MED ORDER — SODIUM CHLORIDE 0.9 % IV SOLN
INTRAVENOUS | Status: DC
Start: 2024-08-12 — End: 2024-08-13

## 2024-08-12 MED ORDER — ONDANSETRON HCL 4 MG/2ML IJ SOLN
INTRAMUSCULAR | Status: DC | PRN
  Administered 2024-08-12: 4 mg via INTRAVENOUS

## 2024-08-12 MED ORDER — FENTANYL CITRATE (PF) 250 MCG/5ML IJ SOLN
INTRAMUSCULAR | Status: DC | PRN
  Administered 2024-08-12: 50 ug via INTRAVENOUS
  Administered 2024-08-12: 100 ug via INTRAVENOUS
  Administered 2024-08-12: 50 ug via INTRAVENOUS

## 2024-08-12 MED ORDER — FENTANYL CITRATE (PF) 250 MCG/5ML IJ SOLN
INTRAMUSCULAR | Status: AC
  Filled 2024-08-12: qty 5

## 2024-08-12 MED ORDER — SODIUM CHLORIDE (PF) 0.9 % IJ SOLN
INTRAMUSCULAR | Status: AC
  Filled 2024-08-12: qty 50

## 2024-08-12 MED ORDER — FERROUS SULFATE 325 (65 FE) MG PO TABS
325.0000 mg | ORAL_TABLET | Freq: Every day | ORAL | Status: DC
  Administered 2024-08-13 – 2024-08-15 (×3): 325 mg via ORAL
  Filled 2024-08-12 (×3): qty 1

## 2024-08-12 MED ORDER — METOPROLOL TARTRATE 50 MG PO TABS
50.0000 mg | ORAL_TABLET | Freq: Two times a day (BID) | ORAL | Status: DC
  Administered 2024-08-12 – 2024-08-15 (×6): 50 mg via ORAL
  Filled 2024-08-12 (×6): qty 1

## 2024-08-12 MED ORDER — PNEUMOCOCCAL 20-VAL CONJ VACC 0.5 ML IM SUSY
0.5000 mL | PREFILLED_SYRINGE | INTRAMUSCULAR | Status: DC
  Filled 2024-08-12: qty 0.5

## 2024-08-12 MED ORDER — SODIUM CHLORIDE FLUSH 0.9 % IV SOLN: INTRAVENOUS | Status: DC | PRN

## 2024-08-12 MED ORDER — LACTATED RINGERS IV SOLN: INTRAVENOUS | Status: DC

## 2024-08-12 MED ORDER — ROSUVASTATIN CALCIUM 20 MG PO TABS
20.0000 mg | ORAL_TABLET | Freq: Every morning | ORAL | Status: DC
  Administered 2024-08-13 – 2024-08-15 (×3): 20 mg via ORAL
  Filled 2024-08-12 (×3): qty 1

## 2024-08-12 MED ORDER — OXYCODONE HCL 5 MG PO TABS
5.0000 mg | ORAL_TABLET | ORAL | Status: DC | PRN
  Administered 2024-08-13 – 2024-08-15 (×9): 10 mg via ORAL
  Filled 2024-08-12 (×9): qty 2

## 2024-08-12 MED ORDER — PROPOFOL 10 MG/ML IV BOLUS
INTRAVENOUS | Status: DC | PRN
  Administered 2024-08-12: 120 mg via INTRAVENOUS

## 2024-08-12 MED ORDER — CLOPIDOGREL BISULFATE 75 MG PO TABS
75.0000 mg | ORAL_TABLET | Freq: Every day | ORAL | Status: DC
  Administered 2024-08-13 – 2024-08-15 (×3): 75 mg via ORAL
  Filled 2024-08-12 (×3): qty 1

## 2024-08-12 SURGICAL SUPPLY — 60 items
CANISTER SUCTION 3000ML PPV (SUCTIONS) ×4 IMPLANT
CANNULA REDUCER 12-8 DVNC XI (CANNULA) ×4 IMPLANT
CNTNR URN SCR LID CUP LEK RST (MISCELLANEOUS) ×10 IMPLANT
CONN ST 1/4X3/8 BEN (MISCELLANEOUS) IMPLANT
DEFOGGER SCOPE WARM SEASHARP (MISCELLANEOUS) ×2 IMPLANT
DERMABOND ADVANCED .7 DNX12 (GAUZE/BANDAGES/DRESSINGS) ×2 IMPLANT
DRAIN CHANNEL 28F RND 3/8 FF (WOUND CARE) IMPLANT
DRAPE ARM DVNC X/XI (DISPOSABLE) ×8 IMPLANT
DRAPE COLUMN DVNC XI (DISPOSABLE) ×2 IMPLANT
DRAPE CV SPLIT W-CLR ANES SCRN (DRAPES) ×2 IMPLANT
DRAPE HALF SHEET 40X57 (DRAPES) IMPLANT
DRAPE INCISE IOBAN 66X45 STRL (DRAPES) IMPLANT
DRAPE SURG ORHT 6 SPLT 77X108 (DRAPES) ×2 IMPLANT
ELECT BLADE 6.5 EXT (BLADE) ×2 IMPLANT
ELECTRODE REM PT RTRN 9FT ADLT (ELECTROSURGICAL) ×2 IMPLANT
FORCEPS BPLR FENES DVNC XI (FORCEP) IMPLANT
FORCEPS BPLR LNG DVNC XI (INSTRUMENTS) IMPLANT
GAUZE KITTNER 4X5 RF (MISCELLANEOUS) ×4 IMPLANT
GAUZE SPONGE 4X4 12PLY STRL (GAUZE/BANDAGES/DRESSINGS) ×2 IMPLANT
GLOVE SS BIOGEL STRL SZ 7.5 (GLOVE) ×6 IMPLANT
GLOVE SURG POLYISO LF SZ8 (GLOVE) ×2 IMPLANT
GOWN STRL REUS W/ TWL LRG LVL3 (GOWN DISPOSABLE) ×4 IMPLANT
GOWN STRL REUS W/ TWL XL LVL3 (GOWN DISPOSABLE) ×4 IMPLANT
GOWN STRL REUS W/TWL 2XL LVL3 (GOWN DISPOSABLE) ×2 IMPLANT
GRASPER TIP-UP FEN DVNC XI (INSTRUMENTS) IMPLANT
HEMOSTAT SURGICEL 2X14 (HEMOSTASIS) ×6 IMPLANT
IRRIGATION STRYKERFLOW (MISCELLANEOUS) ×2 IMPLANT
KIT BASIN OR (CUSTOM PROCEDURE TRAY) ×2 IMPLANT
KIT TURNOVER KIT B (KITS) ×2 IMPLANT
NDL HYPO 25GX1X1/2 BEV (NEEDLE) ×2 IMPLANT
NDL SPNL 22GX3.5 QUINCKE BK (NEEDLE) ×2 IMPLANT
NEEDLE HYPO 25GX1X1/2 BEV (NEEDLE) ×2 IMPLANT
NEEDLE SPNL 22GX3.5 QUINCKE BK (NEEDLE) ×2 IMPLANT
PACK CHEST (CUSTOM PROCEDURE TRAY) ×2 IMPLANT
PAD ARMBOARD POSITIONER FOAM (MISCELLANEOUS) ×4 IMPLANT
PORT ACCESS TROCAR AIRSEAL 12 (TROCAR) ×2 IMPLANT
RELOAD STAPLE 45 2.5 WHT DVNC (STAPLE) IMPLANT
RELOAD STAPLE 45 4.3 GRN DVNC (STAPLE) IMPLANT
SEAL UNIV 5-12 XI (MISCELLANEOUS) ×8 IMPLANT
SET TRI-LUMEN FLTR TB AIRSEAL (TUBING) ×2 IMPLANT
SOLN 0.9% NACL POUR BTL 1000ML (IV SOLUTION) ×4 IMPLANT
SOLN STERILE WATER BTL 1000 ML (IV SOLUTION) ×4 IMPLANT
SOLUTION ELECTROSURG ANTI STCK (MISCELLANEOUS) ×2 IMPLANT
SPONGE TONSIL 1 RF SGL (DISPOSABLE) IMPLANT
STAPLER 45 SUREFORM CVD DVNC (STAPLE) IMPLANT
SUT PROLENE 4-0 RB1 .5 CRCL 36 (SUTURE) IMPLANT
SUT SILK 1 MH (SUTURE) ×2 IMPLANT
SUT SILK 2 0 SH (SUTURE) ×2 IMPLANT
SUT VIC AB 1 CTX36XBRD ANBCTR (SUTURE) ×2 IMPLANT
SUT VIC AB 2-0 CTX 36 (SUTURE) ×2 IMPLANT
SUT VIC AB 3-0 X1 27 (SUTURE) ×4 IMPLANT
SUT VICRYL 0 TIES 12 18 (SUTURE) ×2 IMPLANT
SUT VICRYL 0 UR6 27IN ABS (SUTURE) ×4 IMPLANT
SYR 20CC LL (SYRINGE) ×4 IMPLANT
SYSTEM RETRIEVAL ANCHOR 15 (MISCELLANEOUS) IMPLANT
SYSTEM SAHARA CHEST DRAIN ATS (WOUND CARE) ×2 IMPLANT
TAPE CLOTH 4X10 WHT NS (GAUZE/BANDAGES/DRESSINGS) ×2 IMPLANT
TAPE CLOTH SURG 4X10 WHT LF (GAUZE/BANDAGES/DRESSINGS) IMPLANT
TOWEL GREEN STERILE (TOWEL DISPOSABLE) ×2 IMPLANT
TRAY FOLEY MTR SLVR 16FR STAT (SET/KITS/TRAYS/PACK) ×2 IMPLANT

## 2024-08-12 NOTE — Discharge Summary (Addendum)
 Physician Discharge Summary  Patient ID: Nathan [name] MRN: [id number] DOB/AGE: January 28, 1961 63 y.o.  Admit date: 08/12/2024 Discharge date: 08/15/2024  Admission Diagnoses: Stage IIB squamous cell carcinoma of lung s/p neoadjuvant chemoimmunotherapy Patient Active Problem List   Diagnosis Date Noted   Encounter for antineoplastic chemotherapy 07/02/2024   Encounter for antineoplastic immunotherapy 07/02/2024   Primary squamous cell carcinoma of lower lobe of left lung (HCC) 05/21/2024   Lung mass 04/29/2024   Hepatic steatosis    Obesity (BMI 30-39.9) 10/26/2015   CAD in native artery 11/20/2013   Dyslipidemia 11/20/2013   Essential hypertension 11/20/2013   Type 2 diabetes mellitus without complication, without long-term current use of insulin  (HCC) 11/20/2013   Tobacco abuse 11/20/2013   Discharge Diagnoses: Stage IIB squamous cell carcinoma of lung s/p neoadjuvant chemoimmunotherapy downstaged to Pathologic stage IA squamous cell carcinoma of the lung (ypT1,ypN0)  Patient Active Problem List   Diagnosis Date Noted   Status post robot-assisted surgical procedure 08/12/2024   S/P lobectomy of lung 08/12/2024   Encounter for antineoplastic chemotherapy 07/02/2024   Encounter for antineoplastic immunotherapy 07/02/2024   Primary squamous cell carcinoma of lower lobe of left lung (HCC) 05/21/2024   Lung mass 04/29/2024   Hepatic steatosis    Obesity (BMI 30-39.9) 10/26/2015   CAD in native artery 11/20/2013   Dyslipidemia 11/20/2013   Essential hypertension 11/20/2013   Type 2 diabetes mellitus without complication, without long-term current use of insulin  (HCC) 11/20/2013   Tobacco abuse 11/20/2013   Discharged Condition: stable  PCP is [name], MD Referring Provider is Shannon Lynwood, MD  History of Present Illness:  Nathan [name] is a 63 year old gentleman with a history of tobacco abuse, coronary disease, MI, stent, hypertension, hyperlipidemia, type 2  diabetes, hepatic steatosis, and stage IIb squamous cell carcinoma of the left lower lobe.    Squamous cell carcinoma left lower lobe-T1, N1, stage IIb.  Underwent neoadjuvant chemoimmunotherapy with a good partial radiographic response.   We discussed his options now which are surgical resection versus continue chemoimmunotherapy with the addition of radiation.  We discussed the relative advantages and disadvantages of each approach.  He strongly favors surgical resection.  He understands there is no guarantee of a cure with either approach.   I discussed the general nature of the procedure with Nathan [name] and his daughter.  I informed them of the need for general anesthesia, the incisions to be used, the use of the surgical robot, the possible need for conversion to open, use of a drainage tube postoperatively, the expected hospital stay, and the overall recovery.  I informed them of the indications, risks, benefits, and alternatives.  They understand the risks include, but not limited to death, MI, DVT, PE, bleeding, possible need for transfusion, infection, prolonged air leak, cardiac arrhythmias, as well as possibility of other unforeseeable complications.   He accepts the risks and agrees to proceed.   Nathan [name] has CAD with an RCA stent placed in 2006.  No recent symptoms.  Will hold Plavix  for 5 days prior to surgery.  Hospital Course:  Nathan [name] was admitted for elective surgery on 08/12/24 and taken to the OR for left robotic assisted lower lobectomy with lymph node sampling and intercostal nerve block.  He tolerated the procedure well.  Following surgery, he was awakened and extubated.  He was recovered in the postanesthesia care unit and later transferred [facility]  Progressive Care with the chest tube on waterseal.  The patients chest tube did not  exhibit an air leak.  His chest x ray had some sub cutaneous emphysema/atelectasis.  There was no evidence of pneumothorax.  His chest tube was  able to be removed on POD #1.  Follow up chest x-ray showed no evidence of pneumothorax, but increase in atelectasis.  He developed a mild elevation in his creatinine level which was due to use of Toradol, thus this medication was discontinued.  Repeat creatinine levels had returned to norma.[name]  His blood sugars were elevated and his home Metformin was resumed.  He has history of hypertension and his home medications were resumed as tolerated.  He as having difficulty coughing up sputum and was started on Mucinex.  He is ambulating without difficulty.  His pain is well controlled, he is tolerating a diet.  Surgical incisions are healing without evidence of infection.  He is medically stable for discharge home today.  Consults: None   Significant Diagnostic Studies:    EXAM: CT CHEST WITH CONTRAST  CLINICAL DATA:  Non-small cell lung cancer. Shortness of breath and cough.    TECHNIQUE: Multidetector CT imaging of the chest was performed during intravenous contrast administration.   RADIATION DOSE REDUCTION: This exam was performed according to the departmental dose-optimization program which includes automated exposure control, adjustment of the mA and/or kV according to patient size and/or use of iterative reconstruction technique.   CONTRAST:  75mL OMNIPAQUE  IOHEXOL  300 MG/ML  SOLN   COMPARISON:  PET 05/10/2024 and CT chest 05/04/2024.   FINDINGS: Cardiovascular: Atherosclerotic calcification of the aorta, aortic valve and coronary arteries. Normal heart size. Left ventricle appears somewhat hypertrophied. No pericardial effusion.   Mediastinum/Nodes: No pathologically enlarged mediastinal, hilar or axillary lymph nodes. Esophagus is grossly unremarkable.   Lungs/Pleura: Centrilobular emphysema. Ground-glass mass in the anterior segment right upper lobe measures 2.1 x 3.4 cm, stable when remeasured in a similar fashion. Calcified granulomas. Spiculated 1.3 x 1.5 cm nodule in the  superior segment left lower lobe, decreased in size from 2.8 x 3.0 cm. Retraction of the adjacent major fissure with tags to the adjacent pleura. No pleural fluid. Airway is unremarkable.   Upper Abdomen: Visualized portions of the liver, gallbladder, adrenal glands, kidneys, spleen, pancreas, stomach and bowel are grossly unremarkable. No upper abdominal adenopathy.   Musculoskeletal: Degenerative changes in the spine. Lower left anterolateral chest wall lipoma.   IMPRESSION: 1. Interval response to therapy. Left lower lobe nodule has markedly decreased in size. No evidence of metastatic disease. 2. Ground-glass mass in the anterior segment right upper lobe, stable. Recommend continued attention on follow-up as low-grade adenocarcinoma can have this appearance. 3. Aortic atherosclerosis (ICD10-I70.0). Coronary artery calcification. 4.  Emphysema (ICD10-J43.9).     Electronically Signed   By: Newell Eke M.D.   On: 08/07/2024 13:54  Treatments:  Surgery 08/12/24 - Robotic assisted left lower lobectomy -Lymph node dissection -Intercostal nerve block  PATH:  Squamous cell carcinoma left lower lobe, Stage IIb  Discharge Exam: Blood pressure 120/72, pulse 87, temperature 98.3 F (36.8 C), temperature source Oral, resp. rate 16, height 5' 6 (1.676 m), weight 77.1 kg, SpO2 96%.  General appearance: alert, cooperative, and no distress Heart: regular rate and rhythm Lungs: clear to auscultation bilaterally Abdomen: benign Extremities: no edema Wound: clean and dry  Disposition: Discharged to home in stable condition   Allergies as of 08/15/2024   No Known Allergies      Medication List     STOP taking these medications    hydrochlorothiazide 25 MG  tablet Commonly known as: HYDRODIURIL   ondansetron  8 MG tablet Commonly known as: Zofran    prochlorperazine  10 MG tablet Commonly known as: COMPAZINE        TAKE these medications    acetaminophen  500  MG tablet Commonly known as: TYLENOL  Take 500-1,000 mg by mouth every 6 (six) hours as needed (For pain).   albuterol  108 (90 Base) MCG/ACT inhaler Commonly known as: VENTOLIN  HFA Inhale 2 puffs into the lungs every 6 (six) hours as needed for wheezing or shortness of breath.   aspirin EC 81 MG tablet Take 81 mg by mouth in the morning.   augmented betamethasone dipropionate 0.05 % cream Commonly known as: DIPROLENE-AF Apply 1 application. topically 2 (two) times daily as needed (skin itching/irritation.).   clopidogrel  75 MG tablet Commonly known as: PLAVIX  TAKE 1 TABLET BY MOUTH EVERY DAY   ferrous sulfate 325 (65 FE) MG tablet Take 325 mg by mouth daily with breakfast.   gabapentin 100 MG capsule Commonly known as: Neurontin Take 1 capsule (100 mg total) by mouth 3 (three) times daily.   guaiFENesin 600 MG 12 hr tablet Commonly known as: MUCINEX Take 1 tablet (600 mg total) by mouth 2 (two) times daily.   hydrocortisone  1 % lotion Apply 1 Application topically 2 (two) times daily. Apply to rash on arms   metFORMIN 1000 MG tablet Commonly known as: GLUCOPHAGE Take 1,000 mg by mouth 2 (two) times daily.   metoprolol tartrate 50 MG tablet Commonly known as: LOPRESSOR Take 50 mg by mouth 2 (two) times daily.   nitroGLYCERIN  0.4 MG SL tablet Commonly known as: NITROSTAT  Place 1 tablet (0.4 mg total) under the tongue every 5 (five) minutes as needed for chest pain. Max 3 dose   olmesartan  40 MG tablet Commonly known as: BENICAR  TAKE 1 TABLET (40 MG TOTAL) BY MOUTH IN THE MORNING   oxyCODONE 5 MG immediate release tablet Commonly known as: Oxy IR/ROXICODONE Take 1 tablet (5 mg total) by mouth every 4 (four) hours as needed for moderate pain (pain score 4-6).   rosuvastatin  20 MG tablet Commonly known as: CRESTOR  Take 20 mg by mouth in the morning.        Follow-up Information     Kerrin Elspeth BROCKS, MD. Go on 09/03/2024.   Specialty: Cardiothoracic  Surgery Why: Your appontment is at 11:45am. Please arrive at around 11am for a chest x-ray to be performed on the 2nd floor of the same building. Contact information: [address] [place] [address] [phone]                Signed: Laurel JUDITHANN Becket, PA-C 08/15/2024, 8:23 AM

## 2024-08-12 NOTE — Hospital Course (Addendum)
 PCP is [name], MD Referring Provider is Shannon Agent, MD  History of Present Illness:  Nathan [name] is a 63 year old gentleman with a history of tobacco abuse, coronary disease, MI, stent, hypertension, hyperlipidemia, type 2 diabetes, hepatic steatosis, and stage IIb squamous cell carcinoma of the left lower lobe.    Squamous cell carcinoma left lower lobe-T1, N1, stage IIb.  Underwent neoadjuvant chemoimmunotherapy with a good partial radiographic response.   We discussed his options now which are surgical resection versus continue chemoimmunotherapy with the addition of radiation.  We discussed the relative advantages and disadvantages of each approach.  He strongly favors surgical resection.  He understands there is no guarantee of a cure with either approach.   I discussed the general nature of the procedure with Nathan [name] and his daughter.  I informed them of the need for general anesthesia, the incisions to be used, the use of the surgical robot, the possible need for conversion to open, use of a drainage tube postoperatively, the expected hospital stay, and the overall recovery.  I informed them of the indications, risks, benefits, and alternatives.  They understand the risks include, but not limited to death, MI, DVT, PE, bleeding, possible need for transfusion, infection, prolonged air leak, cardiac arrhythmias, as well as possibility of other unforeseeable complications.   He accepts the risks and agrees to proceed.   Nathan [name] has CAD with an RCA stent placed in 2006.  No recent symptoms.  Will hold Plavix  for 5 days prior to surgery.  Hospital Course:  Nathan [name] was admitted for elective surgery on 08/12/24 and taken to the OR for left robotic assisted lower lobectomy with lymph node sampling and intercostal nerve block.  He tolerated the procedure well.  Following surgery, he was awakened and extubated.  He was recovered in the postanesthesia care unit and later  transferred [facility]  Progressive Care with the chest tube on waterseal.

## 2024-08-12 NOTE — Anesthesia Postprocedure Evaluation (Signed)
 Anesthesia Post Note  Patient: Nathan [name]  Procedure(s) Performed: LOBECTOMY, LUNG, ROBOT-ASSISTED, USING VATS (Left: Chest) BLOCK, NERVE, INTERCOSTAL (Chest) BIOPSY, LYMPH NODE (Left: Chest)     Patient location during evaluation: PACU Anesthesia Type: General Level of consciousness: awake and alert Pain management: pain level controlled Vital Signs Assessment: post-procedure vital signs reviewed and stable Respiratory status: spontaneous breathing, nonlabored ventilation, respiratory function stable and patient connected to nasal cannula oxygen Cardiovascular status: blood pressure returned to baseline and stable Postop Assessment: no apparent nausea or vomiting Anesthetic complications: no   No notable events documented.  Last Vitals:  Vitals:   08/12/24 1300 08/12/24 1315  BP: 115/77 121/80  Pulse: 73 66  Resp: 14 19  Temp:    SpO2: 95% 95%    Last Pain:  Vitals:   08/12/24 1315  PainSc: Asleep                 Nathan [name]

## 2024-08-12 NOTE — Anesthesia Procedure Notes (Signed)
 Procedure Name: Intubation Date/Time: 08/12/2024 7:40 AM  Performed by: Zelphia Norleen HERO, CRNAPre-anesthesia Checklist: Patient identified, Emergency Drugs available, Suction available and Patient being monitored Patient Re-evaluated:Patient Re-evaluated prior to induction Oxygen Delivery Method: Circle system utilized Preoxygenation: Pre-oxygenation with 100% oxygen Induction Type: IV induction Ventilation: Mask ventilation without difficulty Laryngoscope Size: Mac and 3 Grade View: Grade I Tube type: Oral Endobronchial tube: Double lumen EBT, EBT position confirmed by fiberoptic bronchoscope and Left and 39 Fr Number of attempts: 1 Airway Equipment and Method: Stylet Placement Confirmation: ETT inserted through vocal cords under direct vision, positive ETCO2 and breath sounds checked- equal and bilateral Tube secured with: Tape Dental Injury: Teeth and Oropharynx as per pre-operative assessment

## 2024-08-12 NOTE — Brief Op Note (Addendum)
 08/12/2024  10:22 AM  PATIENT:  Nathan [name]  63 y.o. 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  PROCEDURE:   LOBECTOMY, LUNG, ROBOT-ASSISTED, USING VATS (Left) - Robotic left lower lobectomy BLOCK, NERVE, INTERCOSTAL BIOPSY, LYMPH NODES (Left)  SURGEON:  Kerrin Elspeth BROCKS, MD   PHYSICIAN ASSISTANT: Laurel Becket, PA  ASSISTANTS: Trudy Ami CROME, CST, Scrub Person                     [place], Christa C, RN, Relief Scrub   ANESTHESIA:   general  EBL:  10 mL   BLOOD ADMINISTERED:none  DRAINS: 28 Blake drain left pleural space   LOCAL MEDICATIONS USED:  Exparel  local and intercostal  SPECIMEN: Left lower lobe and multiple lymph nodes  DISPOSITION OF SPECIMEN:  PATHOLOGY  COUNTS:  correct  DICTATION: .Dragon Dictation  PLAN OF CARE: Admit to inpatient   PATIENT DISPOSITION:  PACU - hemodynamically stable.   Delay start of Pharmacological VTE agent (>24hrs) due to surgical blood loss or risk of bleeding: yes

## 2024-08-12 NOTE — Plan of Care (Signed)
  Problem: Clinical Measurements: Goal: Diagnostic test results will improve Outcome: Progressing Goal: Respiratory complications will improve Outcome: Progressing Goal: Cardiovascular complication will be avoided Outcome: Progressing   Problem: Coping: Goal: Level of anxiety will decrease Outcome: Progressing   Problem: Pain Managment: Goal: General experience of comfort will improve and/or be controlled Outcome: Progressing   Problem: Safety: Goal: Ability to remain free from injury will improve Outcome: Progressing

## 2024-08-12 NOTE — Anesthesia Procedure Notes (Signed)
 Arterial Line Insertion Start/End10/27/2025 7:00 AM, 08/12/2024 7:05 AM Performed by: Zelphia Norleen HERO, CRNA, CRNA  Patient location: Pre-op. Preanesthetic checklist: patient identified, IV checked, site marked, risks and benefits discussed, surgical consent, monitors and equipment checked, pre-op evaluation, timeout performed and anesthesia consent Lidocaine  1% used for infiltration Left, radial was placed Catheter size: 20 G Hand hygiene performed  and maximum sterile barriers used   Attempts: 1 Following insertion, dressing applied and Biopatch. Post procedure assessment: normal and unchanged  Patient tolerated the procedure well with no immediate complications.

## 2024-08-12 NOTE — Transfer of Care (Signed)
 Immediate Anesthesia Transfer of Care Note  Patient: Nathan [name]  Procedure(s) Performed: LOBECTOMY, LUNG, ROBOT-ASSISTED, USING VATS (Left: Chest) BLOCK, NERVE, INTERCOSTAL (Chest) BIOPSY, LYMPH NODE (Left: Chest)  Patient Location: PACU  Anesthesia Type:General  Level of Consciousness: awake  Airway & Oxygen Therapy: Patient Spontanous Breathing  Post-op Assessment: Report given to RN and Post -op Vital signs reviewed and stable  Post vital signs: Reviewed and stable  Last Vitals:  Vitals Value Taken Time  BP 103/60 08/12/24 10:36  Temp 98   Pulse 65 08/12/24 10:45  Resp 18 08/12/24 10:45  SpO2 91 % 08/12/24 10:45  Vitals shown include unfiled device data.  Last Pain: There were no vitals filed for this visit.       Complications: No notable events documented.

## 2024-08-12 NOTE — Interval H&P Note (Signed)
 History and Physical Interval Note:  08/12/2024 7:19 AM  Nathan [name]  has presented today for surgery, with the diagnosis of Squamous cell carcinoma, left lower lobe s/p neoadjunct therapy.  The various methods of treatment have been discussed with the patient and family. After consideration of risks, benefits and other options for treatment, the patient has consented to  Procedure(s) with comments: LOBECTOMY, LUNG, ROBOT-ASSISTED, USING VATS (Left) - Robotic left lower lobectomy as a surgical intervention.  The patient's history has been reviewed, patient examined, no change in status, stable for surgery.  I have reviewed the patient's chart and labs.  Questions were answered to the patient's satisfaction.     Elspeth JAYSON Millers

## 2024-08-13 ENCOUNTER — Encounter (HOSPITAL_COMMUNITY): Payer: Self-pay | Admitting: Thoracic Surgery (Cardiothoracic Vascular Surgery)

## 2024-08-13 ENCOUNTER — Inpatient Hospital Stay (HOSPITAL_COMMUNITY): Payer: Self-pay

## 2024-08-13 ENCOUNTER — Other Ambulatory Visit: Payer: Self-pay

## 2024-08-13 DIAGNOSIS — Z902 Acquired absence of lung [part of]: Secondary | ICD-10-CM

## 2024-08-13 DIAGNOSIS — J9811 Atelectasis: Secondary | ICD-10-CM

## 2024-08-13 DIAGNOSIS — I10 Essential (primary) hypertension: Secondary | ICD-10-CM

## 2024-08-13 DIAGNOSIS — T8182XA Emphysema (subcutaneous) resulting from a procedure, initial encounter: Secondary | ICD-10-CM

## 2024-08-13 LAB — BASIC METABOLIC PANEL WITH GFR
Anion gap: 8 (ref 5–15)
BUN: 17 mg/dL (ref 8–23)
CO2: 24 mmol/L (ref 22–32)
Calcium: 8.2 mg/dL — ABNORMAL LOW (ref 8.9–10.3)
Chloride: 102 mmol/L (ref 98–111)
Creatinine, Ser: 1.06 mg/dL (ref 0.61–1.24)
GFR, Estimated: >60 mL/min (ref >60)
Glucose, Bld: 243 mg/dL — ABNORMAL HIGH (ref 70–99)
Potassium: 4.4 mmol/L (ref 3.5–5.1)
Sodium: 134 mmol/L — ABNORMAL LOW (ref 135–145)

## 2024-08-13 LAB — CBC
HCT: 29.9 % — ABNORMAL LOW (ref 39.0–52.0)
Hemoglobin: 10.4 g/dL — ABNORMAL LOW (ref 13.0–17.0)
MCH: 33.9 pg (ref 26.0–34.0)
MCHC: 34.8 g/dL (ref 30.0–36.0)
MCV: 97.4 fL (ref 80.0–100.0)
Platelets: 163 K/uL (ref 150–400)
RBC: 3.07 MIL/uL — ABNORMAL LOW (ref 4.22–5.81)
RDW: 17.1 % — ABNORMAL HIGH (ref 11.5–15.5)
WBC: 16 K/uL — ABNORMAL HIGH (ref 4.0–10.5)
nRBC: 0 % (ref 0.0–0.2)

## 2024-08-13 LAB — GLUCOSE, CAPILLARY
Glucose-Capillary: 187 mg/dL — ABNORMAL HIGH (ref 70–99)
Glucose-Capillary: 176 mg/dL — ABNORMAL HIGH (ref 70–99)
Glucose-Capillary: 146 mg/dL — ABNORMAL HIGH (ref 70–99)
Glucose-Capillary: 209 mg/dL — ABNORMAL HIGH (ref 70–99)

## 2024-08-13 MED ORDER — METFORMIN HCL 500 MG PO TABS
1000.0000 mg | ORAL_TABLET | Freq: Two times a day (BID) | ORAL | Status: DC
  Administered 2024-08-13 – 2024-08-15 (×4): 1000 mg via ORAL
  Filled 2024-08-13 (×4): qty 2

## 2024-08-13 MED ORDER — CHLORHEXIDINE GLUCONATE CLOTH 2 % EX PADS
6.0000 | MEDICATED_PAD | Freq: Every day | CUTANEOUS | Status: DC
  Administered 2024-08-12 – 2024-08-13 (×2): 6 via TOPICAL

## 2024-08-13 NOTE — Progress Notes (Addendum)
      [address] [employer] [address]             [phone]         1 Day Post-Op Procedure(s) (LRB): LOBECTOMY, LUNG, ROBOT-ASSISTED, USING VATS (Left) BLOCK, NERVE, INTERCOSTAL BIOPSY, LYMPH NODE (Left)  Subjective:  Patient doing well.  He is having some pain stating the Toradol isn't working.[name] Unsure why, but he was not given any oral pain medication since surgery despite order.  He denies N/V.  No BM, + flatus  Objective: Vital signs in last 24 hours: Temp:  [97.6 F (36.4 C)-98.4 F (36.9 C)] 98 F (36.7 C) (10/28 0717) Pulse Rate:  [62-86] 78 (10/28 0717) Cardiac Rhythm: Normal sinus rhythm (10/27 1900) Resp:  [11-20] 20 (10/28 0717) BP: (99-145)/(60-95) 135/81 (10/28 0717) SpO2:  [91 %-98 %] 97 % (10/28 0717) Arterial Line BP: (100-159)/(52-99) 151/77 (10/27 1415)  Intake/Output from previous day: 10/27 0701 - 10/28 0700 In: 1795.4 [I.V.:1695.4; IV Piggyback:100] Out: 2490 [Urine:2300; Blood:10; Chest Tube:180] Intake/Output this shift: Total I/O In: 236 [P.O.:236] Out: -   General appearance: alert, cooperative, and no distress Heart: regular rate and rhythm Lungs: clear to auscultation bilaterally Abdomen: soft, non-tender; bowel sounds normal; no masses,  no organomegaly Extremities: extremities normal, atraumatic, no cyanosis or edema Wound: clean and dry  Lab Results: Recent Labs    08/13/24 0208  WBC 16.0*  HGB 10.4*  HCT 29.9*  PLT 163   BMET:  Recent Labs    08/13/24 0208  NA 134*  K 4.4  CL 102  CO2 24  GLUCOSE 243*  BUN 17  CREATININE 1.06  CALCIUM  8.2*    PT/INR: No results for input(s): LABPROT, INR in the last 72 hours. ABG No results found for: PHART, HCO3, TCO2, ACIDBASEDEF, O2SAT CBG (last 3)  Recent Labs    08/12/24 1651 08/12/24 2114 08/13/24 0556  GLUCAP 221* 294* 187*    Assessment/Plan: S/P Procedure(s) (LRB): LOBECTOMY, LUNG, ROBOT-ASSISTED, USING VATS  (Left) BLOCK, NERVE, INTERCOSTAL BIOPSY, LYMPH NODE (Left)  CV- H/O Essential HTN- Lopressor ordered at home regimen Pulm- CT on water  seal, no air leak appreciated this morning..180cc of output.[name] CXR with some sub q emphysema, atelectasis on left.. no pneumothorax. Possibly d/c chest tube today Renal- creatinine increased from 0.86 to 1.03- this is likely from use of Toradol.. repeat labs in AM Pain control- despite orders patient was not given any oral pain medication, d/c IV Toradol... instructed nurse to please administer oral pain medications and use IV Morphine for breakthrough D/C IV fluids DM- sugars are slightly elevated, continue SSIP resume home Metformin    LOS: 1 day    Rocky Shad, PA-C 08/13/2024 7:40 AM  Patient seen and examined, agree with above CBG poorly controlled- adjust meds No air leak- dc chest tube SCD + enoxaparin + ambulation  Colin Norment C. Kerrin, MD [facility] [phone]

## 2024-08-13 NOTE — TOC CM/SW Note (Signed)
 Transition of Care [facility]) - Inpatient Brief Assessment   Patient Details  Name: Nathan [name] MRN: [id number] Date of Birth: Dec 16, 1960  Transition of Care [facility]) CM/SW Contact:    Lauraine FORBES Saa, LCSWA Phone Number: 08/13/2024, 9:25 AM   Clinical Narrative:  9:25 AM Per chart review, patient resides at home with relatives and child(ren). Patient has a PCP and insurance. Patient does not have SNF/HH/DME history. Patient's preferred pharmacy is [facility] [address]. No TOC needs identified at this time. TOC will continue to follow.  Transition of Care Asessment: Insurance and Status: Insurance coverage has been reviewed Patient has primary care physician: Yes Home environment has been reviewed: Private Residence Prior level of function:: N/A Prior/Current Home Services: No current home services Social Drivers of Health Review: SDOH reviewed no interventions necessary Readmission risk has been reviewed: Yes (Currently Yellow 16%) Transition of care needs: no transition of care needs at this time

## 2024-08-13 NOTE — Op Note (Signed)
 NAME: Nathan [name], YU MEDICAL RECORD NO: [id number] ACCOUNT NO: [id number] DATE OF BIRTH: 1960/12/03 FACILITY: [facility] LOCATION: [facility] PHYSICIAN: Elspeth BROCKS. Kerrin, MD  Operative Report   DATE OF PROCEDURE: 08/12/2024  PREOPERATIVE DIAGNOSES: Squamous cell carcinoma left lower lobe, clinical stage IIB, status post neoadjuvant chemoimmunotherapy.  POSTOPERATIVE DIAGNOSES: Squamous cell carcinoma left lower lobe, clinical stage IIB, status post neoadjuvant chemoimmunotherapy.  PROCEDURES PERFORMED:   Robotic assisted left lower lobectomy,  Lymph node dissection, and  Intercostal nerve blocks levels 3 through 10.  SURGEON: Elspeth BROCKS. Kerrin, MD  ASSISTANT: Laurel Becket, PA.   Experienced assistance was present for this case due to surgical complexity. Laurel Becket assisted with port placement, camera management, robot docking and undocking, instrument exchange, specimen retrieval, suctioning, and wound closure.  ANESTHESIA: General.  FINDINGS: Invagination of pleura in superior segment left lower lobe, relatively complete fissure, benign-appearing lymph nodes.  CLINICAL NOTE:  Nathan [name] is a 63 year old gentleman with a history of tobacco abuse who earlier this year was found to have a nodule in the superior segment of his left lower lobe.  PET CT was consistent with stage IIB disease (T1, N1). Biopsy showed  squamous cell carcinoma.  He underwent neoadjuvant chemoimmunotherapy with a good partial radiographic response and now presents for surgical resection. The indications, risks, benefits, and alternatives were discussed in detail with the patient.  He  understood and accepted the risks and agreed to proceed.  OPERATIVE NOTE:  Nathan [name] was brought to the operating room on 08/12/2024.  He had induction of general anesthesia and was intubated with a double-lumen endotracheal tube.  Intravenous antibiotics were administered. A Foley catheter was placed.  Sequential  compression devices were placed on the calves for DVT prophylaxis.  He was placed in a right lateral decubitus position.  The left chest was prepped and draped in the usual sterile fashion.  There was a Lawyer in place for active warming.  Single lung ventilation of the right lung was initiated and was tolerated well throughout the procedure.  A time-out was performed. A solution containing 20 mL of liposomal bupivacaine , 30 mL of 0.5% bupivacaine , and 50 mL of saline was prepared.  This solution was used for local at the incisions as well as for the intercostal nerve blocks.   An incision was made in the eighth interspace in the midaxillary line. An 8-mm robotic port was inserted. The scope was advanced into the chest.  After confirming interportal placement, carbon dioxide was insufflated per protocol. A 12-mm robotic port was placed in  the eighth interspace anterior to the camera port. Intercostal nerve blocks were performed in the third to the tenth interspace by injecting 10 mL of bupivacaine  solution into a subpleural plane at each level. A 12-mm AirSeal port was placed in the 10th  interspace posterolaterally.  Two additional 8th interspace robotic ports were placed. The robot was deployed.  The camera arm was docked.  Targeting was performed.  The remaining arms were docked. The robotic instruments were inserted with thoracoscopic visualization.  Initial inspection revealed no abnormality of the parietal pleura and there was no pleural effusion. There was some invagination of the visceral pleura in the superior segment of the lower lobe in the area of the tumor. The fissure was relatively complete. The lower lobe was retracted superiorly and the lymph node dissection was initiated. The inferior ligament was divided. All dissection was done with bipolar cautery. Level 9 nodes were removed. Working further superiorly and posteriorly, level  8, and then level 7 nodes were removed. The pleural  reflection was divided at the hilum superiorly, level 10 and level 5 nodes were removed. There was minimal bleeding with the lymph node removal.  The lung then was retracted posteriorly and the pleural reflection was divided at the hilum anteriorly.  The fissure was inspected and the pulmonary artery was visible in the fissure. The pleura overlying the pulmonary artery was incised. Dissection was carried anteriorly. Two level 11 nodes were removed. These appeared relatively benign. There was no invasion of surrounding structures and no definite carcinoma. All the lymph nodes were sent as separate specimens for permanent pathology. The fissure then was completed working posteriorly. There was a level 12 node that was relatively adherent to the basilar segmental pulmonary artery and this was dissected off carefully and sent as a separate specimen. The superior segmental pulmonary artery branches then were encircled and divided with a robotic stapler.  The inferior pulmonary vein was encircled and divided.  Additional dissection was performed to allow encirclement of the basilar segmental PA trunk and it was divided with a robotic stapler. The stapler then was placed across the left lower lobe bronchus at its origin and closed. The test inflation showed good aeration of the upper lobe. The stapler was fired transecting the left lower lobe bronchus.  The sponges and vessel loop used during the dissection were removed.  The chest was copiously irrigated with saline.  A test inflation to 30 cm of water  pressure revealed no evidence of an air leak.  The robotic instruments were removed. The robot was undocked.  The anterior eighth interspace incision was lengthened to 3 cm.  A 15-mm endoscopic retrieval bag was placed into the chest and the lower lobe was manipulated into the bag.  It was removed and sent for frozen section of the bronchial margin, which subsequently returned with no tumor seen.  The staple lines and port  sites were inspected for hemostasis. A 28-French Blake drain was placed at the original port incision and directed to the apex. It was secured with a #1 silk suture. Dual lung ventilation was resumed. The incisions were closed in standard fashion. All sponge, needle, and instrument counts were correct at the end of the procedure. The patient was placed back in the supine position. The chest tube was placed to a Pleurovac on water  seal. He was extubated in the operating room and taken to the postanesthesia care unit in good condition.    MUK D: 08/12/2024 3:35:32 pm T: 08/13/2024 1:12:00 am  JOB: [id number]/ [id number]

## 2024-08-13 NOTE — Plan of Care (Signed)
  Problem: Education: Goal: Knowledge of General Education information will improve Description: Including pain rating scale, medication(s)/side effects and non-pharmacologic comfort measures Outcome: Progressing   Problem: Health Behavior/Discharge Planning: Goal: Ability to manage health-related needs will improve Outcome: Progressing   Problem: Clinical Measurements: Goal: Respiratory complications will improve Outcome: Progressing   Problem: Pain Managment: Goal: General experience of comfort will improve and/or be controlled Outcome: Progressing   Problem: Activity: Goal: Risk for activity intolerance will decrease Outcome: Progressing   Problem: Respiratory: Goal: Respiratory status will improve Outcome: Progressing

## 2024-08-14 ENCOUNTER — Inpatient Hospital Stay (HOSPITAL_COMMUNITY): Payer: Self-pay

## 2024-08-14 DIAGNOSIS — I493 Ventricular premature depolarization: Secondary | ICD-10-CM

## 2024-08-14 LAB — CBC
HCT: 29 % — ABNORMAL LOW (ref 39.0–52.0)
Hemoglobin: 10.1 g/dL — ABNORMAL LOW (ref 13.0–17.0)
MCH: 34.4 pg — ABNORMAL HIGH (ref 26.0–34.0)
MCHC: 34.8 g/dL (ref 30.0–36.0)
MCV: 98.6 fL (ref 80.0–100.0)
Platelets: 170 K/uL (ref 150–400)
RBC: 2.94 MIL/uL — ABNORMAL LOW (ref 4.22–5.81)
RDW: 17.6 % — ABNORMAL HIGH (ref 11.5–15.5)
WBC: 13.4 K/uL — ABNORMAL HIGH (ref 4.0–10.5)
nRBC: 0 % (ref 0.0–0.2)

## 2024-08-14 LAB — COMPREHENSIVE METABOLIC PANEL WITH GFR
ALT: 18 U/L (ref 0–44)
AST: 17 U/L (ref 15–41)
Albumin: 2.9 g/dL — ABNORMAL LOW (ref 3.5–5.0)
Alkaline Phosphatase: 57 U/L (ref 38–126)
Anion gap: 9 (ref 5–15)
BUN: 15 mg/dL (ref 8–23)
CO2: 25 mmol/L (ref 22–32)
Calcium: 7.9 mg/dL — ABNORMAL LOW (ref 8.9–10.3)
Chloride: 105 mmol/L (ref 98–111)
Creatinine, Ser: 0.91 mg/dL (ref 0.61–1.24)
GFR, Estimated: >60 mL/min (ref >60)
Glucose, Bld: 164 mg/dL — ABNORMAL HIGH (ref 70–99)
Potassium: 4 mmol/L (ref 3.5–5.1)
Sodium: 139 mmol/L (ref 135–145)
Total Bilirubin: 0.4 mg/dL (ref 0.0–1.2)
Total Protein: 5.3 g/dL — ABNORMAL LOW (ref 6.5–8.1)

## 2024-08-14 LAB — GLUCOSE, CAPILLARY
Glucose-Capillary: 114 mg/dL — ABNORMAL HIGH (ref 70–99)
Glucose-Capillary: 156 mg/dL — ABNORMAL HIGH (ref 70–99)
Glucose-Capillary: 125 mg/dL — ABNORMAL HIGH (ref 70–99)
Glucose-Capillary: 194 mg/dL — ABNORMAL HIGH (ref 70–99)

## 2024-08-14 LAB — SURGICAL PATHOLOGY

## 2024-08-14 MED ORDER — POLYETHYLENE GLYCOL 3350 17 G PO PACK
17.0000 g | PACK | Freq: Every day | ORAL | Status: DC | PRN
  Administered 2024-08-14: 17 g via ORAL
  Filled 2024-08-14: qty 1

## 2024-08-14 MED ORDER — GUAIFENESIN ER 600 MG PO TB12
600.0000 mg | ORAL_TABLET | Freq: Two times a day (BID) | ORAL | Status: DC
  Administered 2024-08-14 – 2024-08-15 (×3): 600 mg via ORAL
  Filled 2024-08-14 (×3): qty 1

## 2024-08-14 MED ORDER — POLYETHYLENE GLYCOL 3350 17 G PO PACK: 17.0000 g | PACK | Freq: Every day | ORAL | Status: DC

## 2024-08-14 NOTE — Plan of Care (Signed)

## 2024-08-14 NOTE — Progress Notes (Addendum)
      [address] [employer] [address]             [phone]         2 Days Post-Op Procedure(s) (LRB): LOBECTOMY, LUNG, ROBOT-ASSISTED, USING VATS (Left) BLOCK, NERVE, INTERCOSTAL BIOPSY, LYMPH NODE (Left)  Subjective:  Pain exacerbated by coughing.  Not able to get much up.  He ambulated the hallway 3x yesterday.  Passing gas but no BM yet  Objective: Vital signs in last 24 hours: Temp:  [97.8 F (36.6 C)-98.3 F (36.8 C)] 98.3 F (36.8 C) (10/29 0312) Pulse Rate:  [50-84] 68 (10/29 0312) Cardiac Rhythm: Normal sinus rhythm (10/29 0709) Resp:  [17-20] 20 (10/29 0312) BP: (94-124)/(51-68) 124/54 (10/29 0312) SpO2:  [92 %-97 %] 94 % (10/29 0312)  Intake/Output from previous day: 10/28 0701 - 10/29 0700 In: 596 [P.O.:596] Out: 2701 [Urine:2625; Chest Tube:76]  General appearance: alert, cooperative, and no distress Heart: regular rate and rhythm Lungs: clear to auscultation bilaterally Abdomen: soft, non-tender; bowel sounds normal; no masses,  no organomegaly Extremities: extremities normal, atraumatic, no cyanosis or edema Wound: clean and dry, some mild sub q emphysema present  Lab Results: Recent Labs    08/13/24 0208 08/14/24 0213  WBC 16.0* 13.4*  HGB 10.4* 10.1*  HCT 29.9* 29.0*  PLT 163 170   BMET:  Recent Labs    08/13/24 0208 08/14/24 0213  NA 134* 139  K 4.4 4.0  CL 102 105  CO2 24 25  GLUCOSE 243* 164*  BUN 17 15  CREATININE 1.06 0.91  CALCIUM  8.2* 7.9*    PT/INR: No results for input(s): LABPROT, INR in the last 72 hours. ABG No results found for: PHART, HCO3, TCO2, ACIDBASEDEF, O2SAT CBG (last 3)  Recent Labs    08/13/24 1605 08/13/24 2050 08/14/24 0600  GLUCAP 146* 209* 114*    Assessment/Plan: S/P Procedure(s) (LRB): LOBECTOMY, LUNG, ROBOT-ASSISTED, USING VATS (Left) BLOCK, NERVE, INTERCOSTAL BIOPSY, LYMPH NODE (Left)  CV- NSR with PVCs- continue home Lopressor Pulm- chest tube  removed yesterday, no pneumothorax post removal.. however increased atelectasis.. patient only using IS once per hour, instructed on increasing frequency.. Also having difficulty getting sputum up will add Mucinex Renal- creatinine back to normal at 0.9 DM- sugars control improved with addition of home Metformin Lovenox for DVT prophylaxis Dispo- patient stable, cxr with increase in atelectasis post chest tube removal, instructed patient on more frequent use of IS.[name] add mucinex to help with sputum production....likely for d/c home today   LOS: 2 days    Rocky Shad, PA-C 08/14/2024 7:35 AM Patient seen and examined, agree with above Looks good Will see how he does this morning, possibly home later today Path pending  Elspeth C. Kerrin, MD [facility] [phone]

## 2024-08-15 ENCOUNTER — Inpatient Hospital Stay (HOSPITAL_COMMUNITY): Payer: Self-pay

## 2024-08-15 LAB — GLUCOSE, CAPILLARY: Glucose-Capillary: 117 mg/dL — ABNORMAL HIGH (ref 70–99)

## 2024-08-15 MED ORDER — GUAIFENESIN ER 600 MG PO TB12: 600.0000 mg | ORAL_TABLET | Freq: Two times a day (BID) | ORAL | Status: AC

## 2024-08-15 MED ORDER — OXYCODONE HCL 5 MG PO TABS: 5.0000 mg | ORAL_TABLET | ORAL | 0 refills | Status: DC | PRN

## 2024-08-15 NOTE — Progress Notes (Addendum)
      [address] [employer] [address]             [phone]         3 Days Post-Op Procedure(s) (LRB): LOBECTOMY, LUNG, ROBOT-ASSISTED, USING VATS (Left) BLOCK, NERVE, INTERCOSTAL BIOPSY, LYMPH NODE (Left)  Subjective: Chest tube removed yesterday.  Sitting up in bed. Feels good, no shortness of breath, and pain controlled. Would like to return home.   Objective: Vital signs in last 24 hours: Temp:  [97.8 F (36.6 C)-99 F (37.2 C)] 98.3 F (36.8 C) (10/30 0749) Pulse Rate:  [66-87] 87 (10/30 0749) Cardiac Rhythm: Normal sinus rhythm (10/29 1900) Resp:  [12-20] 16 (10/30 0749) BP: (99-120)/(61-74) 120/72 (10/30 0749) SpO2:  [93 %-98 %] 96 % (10/30 0749)  Intake/Output from previous day: 10/29 0701 - 10/30 0700 In: 592 [P.O.:592] Out: 2350 [Urine:2350]  General appearance: alert, cooperative, and no distress Heart: regular rate and rhythm Lungs: clear to auscultation bilaterally Abdomen: benign Extremities: no edema Wound: clean and dry   Lab Results: Recent Labs    08/13/24 0208 08/14/24 0213  WBC 16.0* 13.4*  HGB 10.4* 10.1*  HCT 29.9* 29.0*  PLT 163 170   BMET:  Recent Labs    08/13/24 0208 08/14/24 0213  NA 134* 139  K 4.4 4.0  CL 102 105  CO2 24 25  GLUCOSE 243* 164*  BUN 17 15  CREATININE 1.06 0.91  CALCIUM  8.2* 7.9*    PT/INR: No results for input(s): LABPROT, INR in the last 72 hours. ABG No results found for: PHART, HCO3, TCO2, ACIDBASEDEF, O2SAT CBG (last 3)  Recent Labs    08/14/24 1606 08/14/24 2051 08/15/24 0626  GLUCAP 125* 194* 117*    Assessment/Plan: S/P Procedure(s) (LRB): LOBECTOMY, LUNG, ROBOT-ASSISTED, USING VATS (Left) BLOCK, NERVE, INTERCOSTAL BIOPSY, LYMPH NODE (Left)   -POD3 robotic-assisted left lower lobectomy for stage IIb squamous cell carcinoma. CT removed yesterday and follow up CXR stable with no PTX. Oxygenating well on RA and independent with mobility.    -History of HTN- BP controlled on metoprolol.   -History of CAD with PTCI- Plavix  resumed.   -DM2- CBGs reasonable. Continue his home Rx.   -Dispo- discharge to home today. Follow up arranged. Encouraged to continue working daily on pulm hygiene.    LOS: 3 days    Laurel JUDITHANN Becket, PA-C 08/15/2024 8:08 AM  Patient seen and examined, agree with above Pathologic stage IA(ypT1,ypN0)  Elspeth BROCKS. Kerrin, MD [facility] [phone]

## 2024-08-15 NOTE — TOC Transition Note (Signed)
 Transition of Care [facility]) - Discharge Note   Patient Details  Name: Nathan [name] MRN: [id number] Date of Birth: 11-18-1960  [facility]) CM/SW Contact:  Roxie KANDICE Stain, RN Phone Number: 08/15/2024, 8:51 AM   Clinical Narrative:    [name] is stable to discharge home. Follow up apt on [facility]. No ICM (Inpatient Care Management) needs at this time.   Final next level of care: Home/Self Care Barriers to Discharge: Barriers Resolved   Patient Goals and CMS Choice Patient states their goals for this hospitalization and ongoing recovery are:: return home          Discharge Placement             home          Discharge Plan and Services Additional resources added to the After Visit Summary for                                       Social Drivers of Health (SDOH) Interventions SDOH Screenings   Food Insecurity: No Food Insecurity (08/12/2024)  Housing: Low Risk  (08/12/2024)  Transportation Needs: No Transportation Needs (08/12/2024)  Utilities: Not At Risk (08/12/2024)  Depression (PHQ2-9): Low Risk  (07/02/2024)  Recent Concern: Depression (PHQ2-9) - Medium Risk (06/27/2024)  Tobacco Use: High Risk (08/12/2024)     Readmission Risk Interventions    08/15/2024    8:51 AM  Readmission Risk Prevention Plan  Transportation Screening Complete  PCP or Specialist Appt within 5-7 Days Not Complete  Not Complete comments Specialist apt 09/03/24  Home Care Screening Complete  Medication Review (RN CM) Complete

## 2024-08-15 NOTE — Discharge Instructions (Signed)

## 2024-08-16 ENCOUNTER — Encounter: Payer: Self-pay | Admitting: Thoracic Surgery (Cardiothoracic Vascular Surgery)

## 2024-08-22 ENCOUNTER — Other Ambulatory Visit: Payer: Self-pay

## 2024-08-22 NOTE — Progress Notes (Signed)
 The proposed treatment discussed in conference is for discussion purpose only and is not a binding recommendation.  The patients have not been physically examined, or presented with their treatment options.  Therefore, final treatment plans cannot be decided.

## 2024-09-02 ENCOUNTER — Other Ambulatory Visit: Payer: Self-pay | Admitting: Thoracic Surgery (Cardiothoracic Vascular Surgery)

## 2024-09-02 DIAGNOSIS — C3432 Malignant neoplasm of lower lobe, left bronchus or lung: Secondary | ICD-10-CM

## 2024-09-03 ENCOUNTER — Telehealth: Payer: Self-pay | Admitting: Internal Medicine

## 2024-09-03 ENCOUNTER — Ambulatory Visit
Payer: Self-pay | Attending: Thoracic Surgery (Cardiothoracic Vascular Surgery) | Admitting: Thoracic Surgery (Cardiothoracic Vascular Surgery)

## 2024-09-03 ENCOUNTER — Ambulatory Visit (HOSPITAL_COMMUNITY)
Admission: RE | Admit: 2024-09-03 | Discharge: 2024-09-03 | Disposition: A | Discharge status: Home / Self Care | Source: Ambulatory Visit | Attending: Cardiology | Admitting: Cardiology

## 2024-09-03 ENCOUNTER — Encounter: Payer: Self-pay | Admitting: Thoracic Surgery (Cardiothoracic Vascular Surgery)

## 2024-09-03 ENCOUNTER — Inpatient Hospital Stay: Payer: Self-pay

## 2024-09-03 VITALS — BP 135/83 | HR 70 | Resp 20 | Ht 66.0 in | Wt 163.5 lb

## 2024-09-03 DIAGNOSIS — C3432 Malignant neoplasm of lower lobe, left bronchus or lung: Secondary | ICD-10-CM | POA: Insufficient documentation

## 2024-09-03 DIAGNOSIS — Z09 Encounter for follow-up examination after completed treatment for conditions other than malignant neoplasm: Secondary | ICD-10-CM

## 2024-09-03 MED ORDER — GABAPENTIN 300 MG PO CAPS: 300.0000 mg | ORAL_CAPSULE | Freq: Three times a day (TID) | ORAL | 5 refills | Status: DC

## 2024-09-03 MED ORDER — OXYCODONE HCL 5 MG PO TABS: 5.0000 mg | ORAL_TABLET | Freq: Two times a day (BID) | ORAL | 0 refills | Status: DC | PRN

## 2024-09-03 NOTE — Progress Notes (Signed)
 [address], [name] [address]             [phone]    HPI: Nathan [name] returns for follow-up after recent left lower lobectomy.  Nathan [name] is a 63 year old man with a history of tobacco use, CAD, MI, stent, hypertension, hyperlipidemia, type 2 diabetes, hepatic steatosis, and non-small cell carcinoma of the lung.  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  It was a low risk study but he was noted to have a left lower lobe lung mass.  CT showed a 2.8 cm mass with a prominent left hilar node.  Both were hypermetabolic on PET/CT.  Navigational bronchoscopy showed non-small cell carcinoma.  He underwent neoadjuvant chemoimmunotherapy with carboplatin , paclitaxel , and nivolumab .  He then underwent a robotic assisted left lower lobectomy on 08/12/2024.  His final pathology was a ypT1a, ypN0, YP stage Ia non-small cell carcinoma.  There was only a tiny microscopic focus of residual tumor.  He did well postoperatively and went home on postoperative day #3.  Doing well from a surgical standpoint.  Does not have much pain associated with his incisions.  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  He is taking gabapentin 100 mg 3 times a day and also has been using his oxycodone for that.  He ran out of oxycodone a couple of days ago.  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     albuterol  (VENTOLIN  HFA) 108 (90 Base) MCG/ACT inhaler Inhale 2 puffs into the lungs every 6 (six) hours as needed for wheezing or shortness of breath. 8 g 0   aspirin EC 81 MG tablet Take 81 mg by mouth in the morning.     augmented betamethasone dipropionate (DIPROLENE-AF) 0.05 % cream Apply 1 application. topically 2 (two) times daily as needed (skin itching/irritation.).     clopidogrel  (PLAVIX ) 75 MG tablet TAKE 1 TABLET BY MOUTH EVERY DAY 90 tablet 3   ferrous sulfate 325 (65 FE) MG tablet Take 325 mg by mouth daily with breakfast.     guaiFENesin (MUCINEX) 600 MG 12 hr tablet Take 1 tablet (600 mg total) by mouth 2 (two) times daily.     hydrocortisone  1 % lotion Apply 1 Application topically 2 (two) times daily. Apply to rash on arms 118 mL 0   metFORMIN (GLUCOPHAGE) 1000 MG tablet Take 1,000 mg by mouth 2 (two) times daily.     metoprolol (LOPRESSOR) 50 MG tablet Take 50 mg by mouth 2 (two) times daily.      nitroGLYCERIN  (NITROSTAT ) 0.4 MG SL tablet Place 1 tablet (0.4 mg total) under the tongue every 5 (five) minutes as needed for  chest pain. Max 3 dose 25 tablet 3   olmesartan  (BENICAR ) 40 MG tablet TAKE 1 TABLET (40 MG TOTAL) BY MOUTH IN THE MORNING 90 tablet 3   rosuvastatin  (CRESTOR ) 20 MG tablet Take 20 mg by mouth in the morning.     gabapentin (NEURONTIN) 300 MG capsule Take 1 capsule (300 mg total) by mouth 3 (three) times daily. 90 capsule 5   oxyCODONE (OXY IR/ROXICODONE) 5 MG immediate release tablet Take 1 tablet (5 mg total) by mouth 2 (two) times daily as needed for moderate pain (pain score 4-6). 14 tablet 0   No current facility-administered medications for this visit.    Physical Exam BP 135/83 (BP Location: Left Arm,  Patient Position: Sitting, Cuff Size: Normal)   Pulse 70   Resp 20   Ht 5' 6 (1.676 m)   Wt 163 lb 8 oz (74.2 kg)   SpO2 96% Comment: RA  BMI 26.67 kg/m  63 year old man in no acute distress Alert and oriented x 3 with no focal deficits Lungs slightly diminished at left base but otherwise clear Incisions well-healed No peripheral edema  Diagnostic Tests: I personally reviewed his chest x-ray images.  Postoperative changes from left lower lobectomy.  No concerning findings.  Impression: Nathan [name] is a 63 year old man with a history of tobacco use, CAD, MI, stent, hypertension, hyperlipidemia, type 2 diabetes, hepatic steatosis, and non-small cell carcinoma of the lung.  Non-small cell carcinoma of the lung-clinical stage IIb (T1, N1) treated with neoadjuvant chemoimmunotherapy and then lobectomy.  Pathologic stage ypIA (ypT1a, ypN0).  Status post left lower lobectomy.-He is doing extremely well.  He has only minimal incisional discomfort.  Has not had any respiratory problems.  May begin driving a car on a limited basis.  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  Peripheral neuropathy-primary complaint at present.  Will increase his gabapentin dose to 300 mg 3 times daily.  Also gave him a refill for oxycodone 5 mg tablets 1 p.o. twice daily, 14 tablets, no refills.   Plan: Will arrange follow-up with Dr. Sherrod Return in 6 weeks with PA lateral chest x-ray   Nathan JAYSON Millers, MD [facility] [phone]

## 2024-09-03 NOTE — Telephone Encounter (Signed)
 Left the patient a voicemail with the scheduled appointment details per re-referral from Dr.Hendrickson. Okay per Cassie.

## 2024-09-05 ENCOUNTER — Other Ambulatory Visit: Payer: Self-pay

## 2024-09-10 ENCOUNTER — Inpatient Hospital Stay (HOSPITAL_BASED_OUTPATIENT_CLINIC_OR_DEPARTMENT_OTHER): Admitting: Internal Medicine

## 2024-09-10 ENCOUNTER — Inpatient Hospital Stay: Attending: Internal Medicine

## 2024-09-10 VITALS — BP 151/81 | HR 78 | Temp 98.2°F | Resp 17 | Ht 66.0 in | Wt 162.0 lb

## 2024-09-10 DIAGNOSIS — C3490 Malignant neoplasm of unspecified part of unspecified bronchus or lung: Secondary | ICD-10-CM

## 2024-09-10 DIAGNOSIS — C3432 Malignant neoplasm of lower lobe, left bronchus or lung: Secondary | ICD-10-CM

## 2024-09-10 LAB — CMP (CANCER CENTER ONLY)
ALT: 28 U/L (ref 0–44)
AST: 26 U/L (ref 15–41)
Albumin: 4.6 g/dL (ref 3.5–5.0)
Alkaline Phosphatase: 78 U/L (ref 38–126)
Anion gap: 10 (ref 5–15)
BUN: 9 mg/dL (ref 8–23)
CO2: 26 mmol/L (ref 22–32)
Calcium: 9.8 mg/dL (ref 8.9–10.3)
Chloride: 103 mmol/L (ref 98–111)
Creatinine: 0.82 mg/dL (ref 0.61–1.24)
GFR, Estimated: >60 mL/min (ref >60)
Glucose, Bld: 114 mg/dL — ABNORMAL HIGH (ref 70–99)
Potassium: 4 mmol/L (ref 3.5–5.1)
Sodium: 140 mmol/L (ref 135–145)
Total Bilirubin: 0.4 mg/dL (ref 0.0–1.2)
Total Protein: 7.4 g/dL (ref 6.5–8.1)

## 2024-09-10 LAB — CBC WITH DIFFERENTIAL (CANCER CENTER ONLY)
Abs Immature Granulocytes: 0.03 K/uL (ref 0.00–0.07)
Basophils Absolute: 0.1 K/uL (ref 0.0–0.1)
Basophils Relative: 1 %
Eosinophils Absolute: 0.2 K/uL (ref 0.0–0.5)
Eosinophils Relative: 3 %
HCT: 40.5 % (ref 39.0–52.0)
Hemoglobin: 14.3 g/dL (ref 13.0–17.0)
Immature Granulocytes: 0 %
Lymphocytes Relative: 21 %
Lymphs Abs: 1.9 K/uL (ref 0.7–4.0)
MCH: 32.5 pg (ref 26.0–34.0)
MCHC: 35.3 g/dL (ref 30.0–36.0)
MCV: 92 fL (ref 80.0–100.0)
Monocytes Absolute: 0.8 K/uL (ref 0.1–1.0)
Monocytes Relative: 8 %
Neutro Abs: 6 K/uL (ref 1.7–7.7)
Neutrophils Relative %: 67 %
Platelet Count: 182 K/uL (ref 150–400)
RBC: 4.4 MIL/uL (ref 4.22–5.81)
RDW: 12.8 % (ref 11.5–15.5)
WBC Count: 9 K/uL (ref 4.0–10.5)
nRBC: 0 % (ref 0.0–0.2)

## 2024-09-10 NOTE — Progress Notes (Signed)
**Note Nathan-Identified via Obfuscation**  [name] [phone]   [phone]  OFFICE PROGRESS NOTE  Nathan Rush, MD [address] [place] [address]  DIAGNOSIS: Stage IIA (T1c, N1, M0) non-small cell lung cancer likely squamous cell carcinoma but adenocarcinoma future could not be completely excluded. He presented with left lower lobe lung nodule in addition to left hilar adenopathy diagnosed in July 2025.   PRIOR THERAPY:  1) Neoadjuvant systemic chemoimmunotherapy with carboplatin  for AUC of 6, paclitaxel  200 Mg/M2 and nivolumab  360 Mg IV every 3 weeks with Neulasta  support.  First dose 06/13/2024.  Status post 3 cycles.  Last dose was given July 23, 2024. 2) left lower lobectomy with lymph node dissection under the care of Dr. Kerrin on August 13, 2024.  The final pathology showed microscopic foci of non-small cell carcinoma in the background of extensive fibrosis, inflammation and necrosis (ypT1a) with negative dissected lymph node for carcinoma.  CURRENT THERAPY: Observation  INTERVAL HISTORY: Nathan [name] 63 y.o. male returns to the clinic today for follow-up visit. Discussed the use of AI scribe software for clinical note transcription with the patient, who gave verbal consent to proceed.  History of Present Illness Nathan [name] is a 63 year old male with stage 2A non-small cell lung cancer who presents for post-operative follow-up.  He was diagnosed with stage 2A non-small cell lung cancer, squamous cell carcinoma, in July 2025. He underwent neoadjuvant systemic chemo-immunotherapy with carboplatin , paclitaxel , and nivolumab  every three weeks for three cycles, followed by a left lower lobectomy with lymph node dissection.  He is currently experiencing some soreness at the surgical site, which he describes as 'just a little bit sore.'  He is experiencing neuropathy in his fingers and feet. He was initially prescribed gabapentin , but the dose was increased a couple  of weeks ago without improvement.    MEDICAL HISTORY: Past Medical History:  Diagnosis Date   Anxiety    pt denies   CAD (coronary artery disease)    Cancer (HCC)    Lung Cancer   COPD (chronic obstructive pulmonary disease) (HCC)    Mild   Depression    Diabetes mellitus    Hepatic steatosis    Hypercholesteremia    Hypertension    Myocardial infarction (HCC) 10/18/2003   Pneumonia     ALLERGIES:  has no known allergies.  MEDICATIONS:  Current Outpatient Medications  Medication Sig Dispense Refill   acetaminophen  (TYLENOL ) 500 MG tablet Take 500-1,000 mg by mouth every 6 (six) hours as needed (For pain).     albuterol  (VENTOLIN  HFA) 108 (90 Base) MCG/ACT inhaler Inhale 2 puffs into the lungs every 6 (six) hours as needed for wheezing or shortness of breath. 8 g 0   aspirin  EC 81 MG tablet Take 81 mg by mouth in the morning.     augmented betamethasone dipropionate (DIPROLENE-AF) 0.05 % cream Apply 1 application. topically 2 (two) times daily as needed (skin itching/irritation.).     clopidogrel  (PLAVIX ) 75 MG tablet TAKE 1 TABLET BY MOUTH EVERY DAY 90 tablet 3   ferrous sulfate  325 (65 FE) MG tablet Take 325 mg by mouth daily with breakfast.     gabapentin  (NEURONTIN ) 300 MG capsule Take 1 capsule (300 mg total) by mouth 3 (three) times daily. 90 capsule 5   guaiFENesin  (MUCINEX ) 600 MG 12 hr tablet Take 1 tablet (600 mg total) by mouth 2 (two) times daily.     hydrocortisone  1 %  lotion Apply 1 Application topically 2 (two) times daily. Apply to rash on arms 118 mL 0   metFORMIN  (GLUCOPHAGE ) 1000 MG tablet Take 1,000 mg by mouth 2 (two) times daily.     metoprolol  (LOPRESSOR ) 50 MG tablet Take 50 mg by mouth 2 (two) times daily.      nitroGLYCERIN  (NITROSTAT ) 0.4 MG SL tablet Place 1 tablet (0.4 mg total) under the tongue every 5 (five) minutes as needed for chest pain. Max 3 dose 25 tablet 3   olmesartan  (BENICAR ) 40 MG tablet TAKE 1 TABLET (40 MG TOTAL) BY MOUTH IN THE  MORNING 90 tablet 3   oxyCODONE  (OXY IR/ROXICODONE ) 5 MG immediate release tablet Take 1 tablet (5 mg total) by mouth 2 (two) times daily as needed for moderate pain (pain score 4-6). 14 tablet 0   rosuvastatin  (CRESTOR ) 20 MG tablet Take 20 mg by mouth in the morning.     No current facility-administered medications for this visit.    SURGICAL HISTORY:  Past Surgical History:  Procedure Laterality Date   APPENDECTOMY     CARDIAC CATHETERIZATION  01/25/2004   Cypher DES (3.5x53mm) for PPMI to RCA (Dr. IVAR Sor)   CARDIAC CATHETERIZATION  01/03/2005   patent stent with noncritical 30% Cfx disease and 30% LAD disease (Dr. DOROTHA Schwalbe)   COLONOSCOPY  09/06/2011   Procedure: COLONOSCOPY;  Surgeon: Oneil DELENA Budge;  Location: [facility];  Service: Gastroenterology;  Laterality: N/A;   COLONOSCOPY WITH PROPOFOL  N/A 01/20/2022   Procedure: COLONOSCOPY WITH PROPOFOL ;  Surgeon: Shaaron Lamar HERO, MD;  Location: [facility];  Service: Endoscopy;  Laterality: N/A;  9:15am   INTERCOSTAL NERVE BLOCK  08/12/2024   Procedure: BLOCK, NERVE, INTERCOSTAL;  Surgeon: Kerrin Elspeth BROCKS, MD;  Location: [facility] OR;  Service: Thoracic;;   LOBECTOMY, LUNG, ROBOT-ASSISTED, USING VATS Left 08/12/2024   Procedure: LOBECTOMY, LUNG, ROBOT-ASSISTED, USING VATS;  Surgeon: Kerrin Elspeth BROCKS, MD;  Location: [facility] OR;  Service: Thoracic;  Laterality: Left;  Robotic left lower lobectomy   NM MYOCAR PERF WALL MOTION  11/2010   bruce myoview ; normal pattern of perfusion in all regions, post-stress EF 62%; normal, low risk scan with improved perfusion from previous study   SENTINEL NODE BIOPSY Left 08/12/2024   Procedure: BIOPSY, LYMPH NODE;  Surgeon: Kerrin Elspeth BROCKS, MD;  Location: [facility] OR;  Service: Thoracic;  Laterality: Left;   TRANSTHORACIC ECHOCARDIOGRAM  12/2011   EF=>55%, mild conc LVH; LA mildly dilated; trace MR & TR with normal RSVP; trace pulm valve regurg   VIDEO BRONCHOSCOPY WITH ENDOBRONCHIAL NAVIGATION Left  05/06/2024   Procedure: VIDEO BRONCHOSCOPY WITH ENDOBRONCHIAL NAVIGATION;  Surgeon: [name], MD;  Location: [facility];  Service: Pulmonary;  Laterality: Left;    REVIEW OF SYSTEMS:  Constitutional: positive for fatigue Eyes: negative Ears, nose, mouth, throat, and face: negative Respiratory: positive for pleurisy/chest pain Cardiovascular: negative Gastrointestinal: negative Genitourinary:negative Integument/breast: negative Hematologic/lymphatic: negative Musculoskeletal:negative Neurological: negative Behavioral/Psych: negative Endocrine: negative Allergic/Immunologic: negative   PHYSICAL EXAMINATION: General appearance: alert, cooperative, fatigued, and no distress Head: Normocephalic, without obvious abnormality, atraumatic Neck: no adenopathy, no JVD, supple, symmetrical, trachea midline, and thyroid  not enlarged, symmetric, no tenderness/mass/nodules Lymph nodes: Cervical, supraclavicular, and axillary nodes normal. Resp: clear to auscultation bilaterally Back: symmetric, no curvature. ROM normal. No CVA tenderness. Cardio: regular rate and rhythm, S1, S2 normal, no murmur, click, rub or gallop GI: soft, non-tender; bowel sounds normal; no masses,  no organomegaly Extremities: extremities normal, atraumatic, no cyanosis or edema  Neurologic: Alert and oriented X 3, normal strength and tone. Normal symmetric reflexes. Normal coordination and gait  ECOG PERFORMANCE STATUS: 1 - Symptomatic but completely ambulatory  Blood pressure (!) 151/81, pulse 78, temperature 98.2 F (36.8 C), temperature source Temporal, resp. rate 17, height 5' 6 (1.676 m), weight 162 lb (73.5 kg), SpO2 100%.  LABORATORY DATA: Lab Results  Component Value Date   WBC 9.0 09/10/2024   HGB 14.3 09/10/2024   HCT 40.5 09/10/2024   MCV 92.0 09/10/2024   PLT 182 09/10/2024      Chemistry      Component Value Date/Time   NA 139 08/14/2024 0213   K 4.0 08/14/2024 0213   CL 105 08/14/2024  0213   CO2 25 08/14/2024 0213   BUN 15 08/14/2024 0213   CREATININE 0.91 08/14/2024 0213   CREATININE 0.82 08/01/2024 1337   CREATININE 0.83 05/18/2013 1055      Component Value Date/Time   CALCIUM  7.9 (L) 08/14/2024 0213   ALKPHOS 57 08/14/2024 0213   AST 17 08/14/2024 0213   AST 18 08/01/2024 1337   ALT 18 08/14/2024 0213   ALT 31 08/01/2024 1337   BILITOT 0.4 08/14/2024 0213   BILITOT 0.4 08/01/2024 1337       RADIOGRAPHIC STUDIES: DG Chest 2 View Result Date: 09/03/2024 EXAM: 2 VIEW(S) XRAY OF THE CHEST 09/03/2024 10:56:00 AM COMPARISON: 08/15/2024 CLINICAL HISTORY: Left lower lobectomy 08/13/2024. FINDINGS: LUNGS AND PLEURA: Continued left retrocardiac airspace opacity likely primarily from atelectasis with mildly elevated left hemidiaphragm in this patient who is status post left lower lobectomy. No pleural effusion. No pneumothorax. HEART AND MEDIASTINUM: Heart size within normal limits. BONES AND SOFT TISSUES: No acute osseous abnormality. IMPRESSION: 1. Persistent left retrocardiac airspace opacity, likely atelectasis, with mildly elevated left hemidiaphragm, status post left lower lobectomy. Electronically signed by: Ryan Salvage MD 09/03/2024 11:21 AM EST RP Workstation: [id number]   DG CHEST PORT 1 VIEW Result Date: 08/15/2024 CLINICAL DATA:  Atelectasis. Robot assisted VATS/lobectomy 08/12/2024. EXAM: PORTABLE CHEST 1 VIEW COMPARISON:  08/14/2024 and CT 08/05/2024 FINDINGS: Lungs are adequately inflated demonstrate persistent hazy opacification over the left base with slight interval improved aeration. Findings likely due to atelectasis and less likely infection. Mild focal density over the left midlung unchanged likely postsurgical change. Mild linear atelectasis right upper lung. No pneumothorax. Subcutaneous emphysema left neck base unchanged. Cardiomediastinal silhouette and remainder of the exam is unchanged. IMPRESSION: 1. Persistent hazy opacification over the left  base with slight interval improved aeration likely due to atelectasis and less likely infection. 2. Mild linear atelectasis right upper lung. Electronically Signed   By: Toribio Agreste M.D.   On: 08/15/2024 08:49   DG Chest 2 View Result Date: 08/14/2024 EXAM: 2 VIEW(S) XRAY OF THE CHEST 08/14/2024 04:57:00 AM COMPARISON: 08/13/2024 CLINICAL HISTORY: S/P lobectomy of lung [id number]. FINDINGS: LUNGS AND PLEURA: Increased atelectasis or infiltrate in posterior left lower lobe. No pneumothorax visualized on today's exam. HEART AND MEDIASTINUM: No acute abnormality of the cardiac and mediastinal silhouettes. BONES AND SOFT TISSUES: Persistent subcutaneous emphysema in left neck and chest wall. No acute osseous abnormality. IMPRESSION: 1. Increased posterior left lower lobe atelectasis or infiltrate. 2. Persistent subcutaneous emphysema in the left neck and chest wall. No residual pneumothorax visualized. Electronically signed by: Norleen Kil MD 08/14/2024 07:14 AM EDT RP Workstation: [id number]   DG Chest 1V REPEAT Same Day Result Date: 08/13/2024 CLINICAL DATA:  Status post left chest tube removal. EXAM: CHEST - 1 VIEW SAME DAY  COMPARISON:  Earlier today. FINDINGS: The left basilar chest tube has been removed. Less than 5% left apical pneumothorax. No significant change in subcutaneous emphysema on the left. Mildly decreased left basilar airspace opacity. Stable elevated left hemidiaphragm. Clear right lung. Aortic arch calcifications. Unremarkable bones. IMPRESSION: 1. Less than 5% left apical pneumothorax following left chest tube removal. 2. Mildly decreased left basilar atelectasis or pneumonia. Electronically Signed   By: Elspeth Bathe M.D.   On: 08/13/2024 16:06   DG Chest Port 1 View Result Date: 08/13/2024 EXAM: 1 VIEW(S) XRAY OF THE CHEST 08/13/2024 06:08:00 AM COMPARISON: Portable chest yesterday at 11:29 AM. CLINICAL HISTORY: [id number] S/P lobectomy of lung [id number]. Reason for exam: S/P lobectomy of lung.  The patient had a left lower lobectomy yesterday. FINDINGS: LINES, TUBES AND DEVICES: There is a left basilar chest tube coursing medially and cephalad in the left thorax with the tip superimposing just below the level of the aortic knob. LUNGS AND PLEURA: Status post left lower lobectomy with volume loss. There is increased linear atelectasis intermixed with additional denser consolidation versus atelectasis in the left base. Perifissural atelectasis in the right mid lung is seen; otherwise, the right lung is clear. No pulmonary edema. Small left pleural effusion. There is no measurable pneumothorax. HEART AND MEDIASTINUM: There is mild cardiomegaly but no evidence of CHF. Normal configuration of the mediastinum with calcification of the transverse aorta. BONES AND SOFT TISSUES: There is increased subcutaneous left chest wall soft tissue gas and increased left supraclavicular fossa soft tissue gas. No new osseous finding. IMPRESSION: 1. Status post left lower lobectomy with increased left basilar atelectasis versus consolidation, with associated volume loss. Small left Pleural effusion. 2. Left basilar chest tube with tip projecting just below the aortic knob. 3. Increased subcutaneous emphysema of the left chest wall and left supraclavicular fossa. 4. Mild cardiomegaly without congestive heart failure. Electronically signed by: Francis Quam MD 08/13/2024 06:53 AM EDT RP Workstation: [id number]   DG Chest Port 1 View Result Date: 08/12/2024 CLINICAL DATA:  Status post lobectomy of lung. EXAM: PORTABLE CHEST 1 VIEW COMPARISON:  Radiograph earlier today CT 08/05/2024 FINDINGS: Postoperative volume loss in the left hemithorax. A chest tube is visualized at the lung base coursing medially towards the apex. No visible pneumothorax. Minor atelectasis at the left lung base. Subsegmental opacity in the right mid lung may represent fluid in the fissure or atelectasis. The heart is normal in size for technique. Normal  mediastinal contours. No significant pleural effusion. IMPRESSION: 1. Postoperative volume loss in the left hemithorax with chest tube in place. No visible pneumothorax. 2. Subsegmental opacity in the right mid lung may represent fluid in the fissure or atelectasis. Electronically Signed   By: Andrea Gasman M.D.   On: 08/12/2024 15:31   DG Chest 2 View Result Date: 08/12/2024 CLINICAL DATA:  Preoperative evaluation EXAM: CHEST - 2 VIEW COMPARISON:  Chest x-ray performed May 06 2024 FINDINGS: The previously observed left lower lobe nodule is less well seen, likely secondary to overlapping structures. No pleural effusion or pneumothorax. Heart size is within normal limits. IMPRESSION: 1. Normal size heart. 2. Left upper lobe pulmonary nodule is better appreciated on CT scan performed July 26, 2024. Electronically Signed   By: Maude Naegeli M.D.   On: 08/12/2024 07:35     ASSESSMENT AND PLAN: This is a very pleasant 63 years old white male with Stage IIa (T1c, N1, M0) non-small cell lung cancer likely squamous cell carcinoma but adenocarcinoma future could  not be completely excluded. He presented with left lower lobe lung nodule in addition to left hilar adenopathy diagnosed in July 2025.  He underwent a course of neoadjuvant systemic chemoimmunotherapy with carboplatin  for AUC of 6, paclitaxel  200 mg/M2 and nivolumab  360 mg IV every 3 weeks with Neulasta  support.  Status post  cycles.  He has been tolerating his treatment fairly well.  This was followed by left lower lobectomy with lymph node dissection under the care of Dr. Kerrin on August 13, 2024 and the final pathology showed microscopic foci of non-small cell carcinoma in the background of extensive fibrosis, inflammation and necrosis (ypT1a) with negative dissected lymph node for carcinoma. I had a lengthy discussion with the patient today about his current condition and treatment options.  I do not see any need for any treatment with the  significant response he has for the neoadjuvant chemoimmunotherapy. Assessment and Plan Assessment & Plan Stage 2A non-small cell lung cancer, post left lower lobectomy Stage 2A non-small cell lung cancer, squamous cell carcinoma, diagnosed in July 2025. Status post neoadjuvant systemic chemoimmunotherapy with carboplatin , paclitaxel , and nivolumab  for three cycles, followed by left lower lobectomy with lymph node dissection. Final pathology showed only microscopic foci of non-small cell carcinoma with extensive fibrosis, inflammation, and necrosis, and negative dissected lymph nodes for malignancy. Impressive response to treatment with minimal residual cancer. - Scheduled follow-up visit in four months with a scan to monitor progress.  Chemotherapy-induced peripheral neuropathy Peripheral neuropathy in fingers and feet, likely secondary to chemotherapy. Gabapentin  was previously prescribed but increased dose did not alleviate symptoms. Primary care physician considering switching to Lyrica or Cymbalta. - Primary care physician to consider switching to Lyrica or Cymbalta for neuropathy management.  Postoperative pain, left lower thorax Mild soreness at the surgical site in the left lower thorax, expected postoperative pain. Hospital stay was four days, and recovery is ongoing. He was advised to call immediately if he has any other concerning symptoms in the interval. The patient voices understanding of current disease status and treatment options and is in agreement with the current care plan.  All questions were answered. The patient knows to call the clinic with any problems, questions or concerns. We can certainly see the patient much sooner if necessary.  The total time spent in the appointment was 30 minutes including review of chart and various tests results, discussions about plan of care and coordination of care plan .  Disclaimer: This note was dictated with voice recognition software.  Similar sounding words can inadvertently be transcribed and may not be corrected upon review.

## 2024-09-11 ENCOUNTER — Telehealth: Payer: Self-pay | Admitting: Internal Medicine

## 2024-09-11 NOTE — Telephone Encounter (Signed)
 Scheduled patient for next appointment. Called and left a voicemail with the details.

## 2024-09-24 ENCOUNTER — Encounter: Payer: Self-pay | Admitting: Thoracic Surgery (Cardiothoracic Vascular Surgery)

## 2024-10-04 ENCOUNTER — Other Ambulatory Visit: Payer: Self-pay | Admitting: Thoracic Surgery (Cardiothoracic Vascular Surgery)

## 2024-10-04 DIAGNOSIS — C3432 Malignant neoplasm of lower lobe, left bronchus or lung: Secondary | ICD-10-CM

## 2024-10-15 ENCOUNTER — Ambulatory Visit
Admission: RE | Admit: 2024-10-15 | Discharge: 2024-10-15 | Disposition: A | Discharge status: Home / Self Care | Source: Ambulatory Visit | Attending: Cardiovascular Disease | Admitting: Cardiovascular Disease

## 2024-10-15 ENCOUNTER — Encounter: Payer: Self-pay | Admitting: Thoracic Surgery (Cardiothoracic Vascular Surgery)

## 2024-10-15 ENCOUNTER — Ambulatory Visit (INDEPENDENT_AMBULATORY_CARE_PROVIDER_SITE_OTHER): Payer: Self-pay | Admitting: Thoracic Surgery (Cardiothoracic Vascular Surgery)

## 2024-10-15 VITALS — BP 103/68 | HR 68 | Resp 18 | Ht 66.0 in

## 2024-10-15 DIAGNOSIS — Z09 Encounter for follow-up examination after completed treatment for conditions other than malignant neoplasm: Secondary | ICD-10-CM

## 2024-10-15 DIAGNOSIS — C3432 Malignant neoplasm of lower lobe, left bronchus or lung: Secondary | ICD-10-CM | POA: Insufficient documentation

## 2024-10-15 NOTE — Progress Notes (Signed)
 "  [address] [name] [address]             [phone]    HPI: [name] returns for a scheduled follow-up visit.  Nathan [name] is a 63 year old man with a history of tobacco use, CAD, MI, stent, hypertension, hyperlipidemia, type 2 diabetes, hepatic steatosis, and non-small cell carcinoma of the lung.  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  CT showed 2.8 cm left lower lobe mass with a prominent left hilar lymph node, both were hypermetabolic on PET/CT.  Navigational bronchoscopy showed non-small cell carcinoma.  He was treated with neoadjuvant chemoimmunotherapy with carboplatin , paclitaxel , and nivolumab .  He had a partial radiographic response.  He underwent robotic assisted left lower lobectomy on 08/12/2024.  Final pathology stage was 1A (ypT1a, ypN0).  ID feels well.  Still has issues with peripheral neuropathy.  He had been on gabapentin  that was changed to pregabalin.  Has been a little better since that medication was changed.  Not having significant pain but does notice bulging in his left upper quadrant.  Past Medical History:  Diagnosis Date   Anxiety    pt denies   CAD (coronary artery disease)    Cancer (HCC)    Lung Cancer   COPD (chronic obstructive pulmonary disease) (HCC)    Mild   Depression    Diabetes mellitus    Hepatic steatosis    Hypercholesteremia    Hypertension    Myocardial infarction (HCC) 10/18/2003   Pneumonia      Current Outpatient Medications  Medication Sig Dispense Refill   acetaminophen  (TYLENOL ) 500 MG tablet Take 500-1,000 mg by mouth every 6 (six) hours as needed (For pain).     albuterol  (VENTOLIN  HFA) 108 (90 Base) MCG/ACT inhaler Inhale 2 puffs into the lungs every 6 (six) hours as needed for wheezing or shortness of breath. 8 g 0   aspirin  EC 81 MG tablet Take 81 mg by mouth in the morning.     augmented betamethasone dipropionate (DIPROLENE-AF) 0.05 % cream Apply 1  application. topically 2 (two) times daily as needed (skin itching/irritation.).     celecoxib (CELEBREX) 200 MG capsule Take 200 mg by mouth 2 (two) times daily as needed.     clopidogrel  (PLAVIX ) 75 MG tablet TAKE 1 TABLET BY MOUTH EVERY DAY 90 tablet 3   ferrous sulfate  325 (65 FE) MG tablet Take 325 mg by mouth daily with breakfast.     guaiFENesin  (MUCINEX ) 600 MG 12 hr tablet Take 1 tablet (600 mg total) by mouth 2 (two) times daily.     hydrocortisone  1 % lotion Apply 1 Application topically 2 (two) times daily. Apply to rash on arms 118 mL 0   metFORMIN  (GLUCOPHAGE ) 1000 MG tablet Take 1,000 mg by mouth 2 (two) times daily.     metoprolol  (LOPRESSOR ) 50 MG tablet Take 50 mg by mouth 2 (two) times daily.      nitroGLYCERIN  (NITROSTAT ) 0.4 MG SL tablet Place 1 tablet (0.4 mg total) under the tongue every 5 (five) minutes as needed for chest pain. Max 3 dose 25 tablet 3   olmesartan  (BENICAR ) 40 MG tablet TAKE 1 TABLET (40 MG TOTAL) BY MOUTH IN THE MORNING 90 tablet 3   pregabalin (LYRICA) 150 MG capsule Take 150 mg by mouth 2 (two) times daily.     rosuvastatin  (CRESTOR ) 20 MG tablet Take 20 mg by mouth  in the morning.     No current facility-administered medications for this visit.    Physical Exam BP 103/68 (BP Location: Right Arm)   Pulse 68   Resp 18   Ht 5' 6 (1.676 m)   SpO2 97%   BMI 26.26 kg/m  63 year old man in no acute distress Alert and oriented x 3 with no focal deficits Lungs slightly diminished at left base but otherwise clear Cardiac regular rate and rhythm Incisions well-healed Muscular laxity left flank and upper quadrant  Diagnostic Tests: I personally reviewed his chest x-ray images.  No effusion or other concerning finding.  Official reading pending.  Impression: Nathan [name] is a 63 year old man with a history of tobacco use, CAD, MI, stent, hypertension, hyperlipidemia, type 2 diabetes, hepatic steatosis, and non-small cell carcinoma of the  lung.  Non-small cell carcinoma of the lung-clinical stage IIb, pathologic stage Ia after neoadjuvant chemoimmunotherapy followed by surgical resection.  Will need follow-up per protocol.  He will do that with Dr. Sherrod.  Status post left lower lobectomy-continues to progress.  Overall doing well.  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   Plan: Follow-up with Dr. Sherrod as scheduled I will be happy to see Nathan [name] back anytime in the future if I can be of any further assistance with this care  Elspeth JAYSON Millers, MD [facility] [phone]     "

## 2024-10-30 ENCOUNTER — Encounter: Payer: Self-pay | Admitting: Acute Care

## 2024-10-30 ENCOUNTER — Ambulatory Visit: Admitting: Acute Care

## 2024-10-30 VITALS — BP 138/82 | HR 81 | Ht 66.0 in | Wt 164.0 lb

## 2024-10-30 DIAGNOSIS — R911 Solitary pulmonary nodule: Secondary | ICD-10-CM

## 2024-10-30 DIAGNOSIS — J449 Chronic obstructive pulmonary disease, unspecified: Secondary | ICD-10-CM

## 2024-10-30 DIAGNOSIS — F17210 Nicotine dependence, cigarettes, uncomplicated: Secondary | ICD-10-CM

## 2024-10-30 DIAGNOSIS — C3490 Malignant neoplasm of unspecified part of unspecified bronchus or lung: Secondary | ICD-10-CM

## 2024-10-30 DIAGNOSIS — Z902 Acquired absence of lung [part of]: Secondary | ICD-10-CM

## 2024-10-30 DIAGNOSIS — F17200 Nicotine dependence, unspecified, uncomplicated: Secondary | ICD-10-CM

## 2024-10-30 DIAGNOSIS — C801 Malignant (primary) neoplasm, unspecified: Secondary | ICD-10-CM

## 2024-10-30 DIAGNOSIS — Z85118 Personal history of other malignant neoplasm of bronchus and lung: Secondary | ICD-10-CM

## 2024-10-30 MED ORDER — TRELEGY ELLIPTA 100-62.5-25 MCG/ACT IN AEPB: 1.0000 | INHALATION_SPRAY | Freq: Every day | RESPIRATORY_TRACT | 6 refills | Status: AC

## 2024-10-30 MED ORDER — BREZTRI AEROSPHERE 160-9-4.8 MCG/ACT IN AERO: INHALATION_SPRAY | RESPIRATORY_TRACT | Status: AC

## 2024-10-30 NOTE — Progress Notes (Signed)
 "  History of Present Illness Nathan [name] is a 64 y.o. male former current some day smoker with COPD, CAD, MI, stent, hypertension, hyperlipidemia, type 2 diabetes, hepatic steatosis, and non-small cell carcinoma of the lung diagnosed 04/2024.    10/30/2024 Discussed the use of AI scribe software for clinical note transcription with the patient, who gave verbal consent to proceed.  Synopsis Nathan [name] is a 64 year old man with a history of tobacco use, COPD, CAD, MI, stent, hypertension, hyperlipidemia, type 2 diabetes, hepatic steatosis, and non-small cell carcinoma of the lung diagnosed 04/2024. .   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  CT showed 2.8 cm left lower lobe mass with a prominent left hilar lymph node, both were hypermetabolic on PET/CT.  Navigational bronchoscopy by Dr. Shelah showed non-small cell carcinoma.Likely squamous but with adeno features on biopsy.    He was treated with neoadjuvant chemoimmunotherapy with carboplatin , paclitaxel , and nivolumab .  He had a partial radiographic response.  He underwent robotic assisted left lower lobectomy on 08/12/2024 by Dr. Kerrin..  Final pathology stage was 1A (ypT1a, ypN0).  Last OV with Dr. Kerrin 10/15/2024, he has been released to the care of Dr. Sherrod. He complains of significant neuropathy post chemo treatment.   History of Present Illness Nathan [name] is a 64 year old male with COPD who presents with concerns about neuropathy and shortness of breath.  He underwent surgery in September 2025 following chemotherapy and radiation, which reduced the size of the tumor. He is scheduled for a follow-up CT scan in March to monitor for recurrence.  He experiences neuropathy in his feet and hands, which began after chemotherapy and has persisted despite discontinuation. He has been prescribed Lyrica, which has improved his sleep but not alleviated the neuropathy. Previously, he was on  gabapentin  without relief. 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 Dr. Sherrod suggested switching from Lyrica to Cymbalta to see if he has a better response.   He has a history of COPD. He experiences shortness of breath, which he attributes to his recent lobectomy and long history of smoking. He uses albuterol  as needed but not daily. Since his surgery in September, he has significantly reduced smoking, having only smoked three cigarettes.   He mentions a significant personal loss, having lost his wife recently, which has compounded the challenges he faces with his health.  We discussed starting Trelegy or Breztri  for his dyspnea related to his COPD. We have sent in a prescription for Trelegy, which is covered by his insurance , but is still expensive for him. We also provided a Trelegy cost savings card. We have provided Breztri  samples ( we do not have any Trelegy samples) so the patient can see if he likes the medication. If he does, we will fill out financial assistance paperwork to see if he qualifies.    I have also referred to Neurology to see if they have anything additional to offer the patient for his neuropathy.     Test Results:              Latest Ref Rng & Units 09/10/2024    3:09 PM 08/14/2024    2:13 AM 08/13/2024    2:08 AM  CBC  WBC 4.0 - 10.5 K/uL 9.0  13.4  16.0   Hemoglobin 13.0 - 17.0 g/dL 85.6  89.8  89.5  Hematocrit 39.0 - 52.0 % 40.5  29.0  29.9   Platelets 150 - 400 K/uL 182  170  163        Latest Ref Rng & Units 09/10/2024    3:09 PM 08/14/2024    2:13 AM 08/13/2024    2:08 AM  BMP  Glucose 70 - 99 mg/dL 885  835  756   BUN 8 - 23 mg/dL 9  15  17    Creatinine 0.61 - 1.24 mg/dL 9.17  9.08  8.93   Sodium 135 - 145 mmol/L 140  139  134   Potassium 3.5 - 5.1 mmol/L 4.0  4.0  4.4   Chloride 98 - 111 mmol/L 103  105  102   CO2 22 - 32 mmol/L 26  25  24    Calcium  8.9 - 10.3  mg/dL 9.8  7.9  8.2     BNP No results found for: BNP  ProBNP No results found for: PROBNP  PFT    Component Value Date/Time   FEV1PRE 1.92 05/27/2024 1022   FEV1POST 2.07 05/27/2024 1022   FVCPRE 2.95 05/27/2024 1022   FVCPOST 3.27 05/27/2024 1022   TLC 6.18 05/27/2024 1022   DLCOUNC 23.62 05/27/2024 1022   PREFEV1FVCRT 65 05/27/2024 1022   PSTFEV1FVCRT 63 05/27/2024 1022    DG Chest 2 View Result Date: 10/15/2024 EXAM: 2 VIEW(S) XRAY OF THE CHEST 10/15/2024 12:18:00 PM COMPARISON: 09/03/2024 CLINICAL HISTORY: LUNG CANCER FINDINGS: LUNGS AND PLEURA: Left lower lobectomy. Left basilar atelectasis/scarring with elevated left hemidiaphragm, unchanged. No pleural effusion. No pneumothorax. HEART AND MEDIASTINUM: No acute abnormality of the cardiac and mediastinal silhouettes. BONES AND SOFT TISSUES: No acute osseous abnormality. IMPRESSION: 1. No acute findings. Electronically signed by: Nathan Naveau MD 10/15/2024 01:48 PM EST RP Workstation: [id number]     Past medical hx Past Medical History:  Diagnosis Date   Anxiety    pt denies   CAD (coronary artery disease)    Cancer (HCC)    Lung Cancer   COPD (chronic obstructive pulmonary disease) (HCC)    Mild   Depression    Diabetes mellitus    Hepatic steatosis    Hypercholesteremia    Hypertension    Myocardial infarction (HCC) 10/18/2003   Pneumonia      Social History[1]  Nathan [name] reports that he has been smoking cigarettes. He has a 52.5 pack-year smoking history. His smokeless tobacco use includes snuff. He reports current alcohol use. He reports that he does not use drugs.  Tobacco Cessation: Ready to quit: Not Answered Counseling given: Not Answered Tobacco comments: 08/06/24, had a couple, last couple weeks Some day smoker   Past surgical hx, Family hx, Social hx all reviewed.  Current Outpatient Medications on File Prior to Visit  Medication Sig   acetaminophen  (TYLENOL ) 500 MG tablet Take  500-1,000 mg by mouth every 6 (six) hours as needed (For pain).   albuterol  (VENTOLIN  HFA) 108 (90 Base) MCG/ACT inhaler Inhale 2 puffs into the lungs every 6 (six) hours as needed for wheezing or shortness of breath.   aspirin  EC 81 MG tablet Take 81 mg by mouth in the morning.   augmented betamethasone dipropionate (DIPROLENE-AF) 0.05 % cream Apply 1 application. topically 2 (two) times daily as needed (skin itching/irritation.).   celecoxib (CELEBREX) 200 MG capsule Take 200 mg by mouth 2 (two) times daily as needed.   clopidogrel  (PLAVIX ) 75 MG tablet TAKE 1 TABLET BY MOUTH EVERY DAY   ferrous sulfate  325 (65 FE)  MG tablet Take 325 mg by mouth daily with breakfast.   guaiFENesin  (MUCINEX ) 600 MG 12 hr tablet Take 1 tablet (600 mg total) by mouth 2 (two) times daily.   hydrocortisone  1 % lotion Apply 1 Application topically 2 (two) times daily. Apply to rash on arms   metFORMIN  (GLUCOPHAGE ) 1000 MG tablet Take 1,000 mg by mouth 2 (two) times daily.   metoprolol  (LOPRESSOR ) 50 MG tablet Take 50 mg by mouth 2 (two) times daily.    nitroGLYCERIN  (NITROSTAT ) 0.4 MG SL tablet Place 1 tablet (0.4 mg total) under the tongue every 5 (five) minutes as needed for chest pain. Max 3 dose   olmesartan  (BENICAR ) 40 MG tablet TAKE 1 TABLET (40 MG TOTAL) BY MOUTH IN THE MORNING   pregabalin (LYRICA) 150 MG capsule Take 150 mg by mouth 2 (two) times daily.   rosuvastatin  (CRESTOR ) 20 MG tablet Take 20 mg by mouth in the morning.   No current facility-administered medications on file prior to visit.     Allergies[2]  Review Of Systems:  Constitutional:   No  weight loss, night sweats,  Fevers, chills, fatigue, or  lassitude.  HEENT:   No headaches,  Difficulty swallowing,  Tooth/dental problems, or  Sore throat,                No sneezing, itching, ear ache, nasal congestion, post nasal drip,   CV:  No chest pain,  Orthopnea, PND, swelling in lower extremities, anasarca, dizziness, palpitations,  syncope.   GI  No heartburn, indigestion, abdominal pain, nausea, vomiting, diarrhea, change in bowel habits, loss of appetite, bloody stools.   Resp: + shortness of breath with exertion or at rest.  No excess mucus, no productive cough,  No non-productive cough,  No coughing up of blood.  No change in color of mucus.  No wheezing.  No chest wall deformity  Skin: no rash or lesions.  GU: no dysuria, change in color of urine, no urgency or frequency.  No flank pain, no hematuria   MS:  No joint pain or swelling.  No decreased range of motion.  No back pain.Neuropathies in his hands and feet.  Psych:  No change in mood or affect. No depression or anxiety.  No memory loss.   Vital Signs BP 138/82   Pulse 81   Ht 5' 6 (1.676 m) Comment: per pt  Wt 164 lb (74.4 kg)   SpO2 98%   BMI 26.47 kg/m    Physical Exam:  Physical Exam GENERAL: No distress, alert and oriented times 3. EARS NOSE THROAT: No sinus tenderness, tympanic membranes clear, pale nasal mucosa, no oral exudate, no post nasal drip, no lymphadenopathy. CHEST: Lungs clear to auscultation bilaterally. No wheeze, rales, dullness, no accessory muscle use, no nasal flaring, no sternal retractions. CARDIAC: S1, S2, regular rate and rhythm, no murmur. ABDOMINAL: Soft, non tender. ND, BS present. EXTREMITIES: No clubbing, cyanosis, edema. No obvious deformities.Neuropathies bilaterally in hands and feet.  NEUROLOGICAL: Normal strength. Alert and oriented x 3, MAE x 4. SKIN: No rashes, warm and dry. No obvious skin lesions. PSYCHIATRIC: Normal mood and behavior.   Assessment/Plan  Assessment & Plan  Non-small cell carcinoma of the lung diagnosed 04/2024.  Post lobectomy with neoadjuvant chemoimmunotherapy with carboplatin , paclitaxel , and nivolumab . Squamous but with adeno features on biopsy.  Plan - Follow up with Dr. Sherrod for surveillance imaging - Next CT Chest due in 6 months.  Chronic obstructive pulmonary  disease Moderate obstructive airways disease  with normal diffusion capacity.  Persistent dyspnea possibly exacerbated by lobectomy and smoking history.  Insurance considerations for inhaler options discussed. - Prescribed Breztri , two puffs twice daily. Instructed to brush teeth and rinse mouth after use. - Provided Breztri  samples for trial. - Prescribed Trelegy with financial assistance paperwork. - Encouraged exercise to improve respiratory function. - Consider PFT's to see if post lobectomy he meets criteria for Pulmonary rehab. - Scheduled follow-up in six months to assess inhaler efficacy.  I spent 30 minutes dedicated to the care of this patient on the date of this encounter to include pre-visit review of records, face-to-face time with the patient discussing conditions above, post visit ordering of testing, clinical documentation with the electronic health record, making appropriate referrals as documented, and communicating necessary information to the patient's healthcare team.        Lauraine JULIANNA Lites, NP 10/30/2024  9:59 AM             [1]  Social History Tobacco Use   Smoking status: Every Day    Current packs/day: 1.50    Average packs/day: 1.5 packs/day for 35.0 years (52.5 ttl pk-yrs)    Types: Cigarettes   Smokeless tobacco: Current    Types: Snuff   Tobacco comments:    08/06/24, had a couple, last couple weeks  Vaping Use   Vaping status: Never Used  Substance Use Topics   Alcohol use: Yes    Comment: occasionally, maybe once a month   Drug use: No  [2] No Known Allergies  "

## 2024-10-30 NOTE — Patient Instructions (Addendum)
 It is good to see you today. I am glad you are doing well. Your Pulmonary function tests show moderate obstructive disease.  This was before your lobectomy.  We will add Trelegy  One  puff  daily . Rinse after use.  We have given you a savings card for Trelegy.  See if this makes Trelegy more affordable. We can give you samples of  Breztri , as we have them available to try.  Take 2 puffs , twice daily. Rinse mouth after use.  Consider Silver Sneakers Program for increasing your exercise.  Note your daily symptoms > remember red flags for COPD:  Increase in cough, increase in sputum production, increase in shortness of breath or activity intolerance. If you notice these symptoms, please call to be seen.    Follow up in 6 months for COPD Check up.  I have referred you to neurology for your neuropathy to see if there is anything to add. Please contact office for sooner follow up if symptoms do not improve or worsen or seek emergency care

## 2024-10-31 ENCOUNTER — Telehealth (HOSPITAL_BASED_OUTPATIENT_CLINIC_OR_DEPARTMENT_OTHER): Payer: Self-pay | Admitting: Acute Care

## 2024-10-31 ENCOUNTER — Encounter: Payer: Self-pay | Admitting: Acute Care

## 2024-10-31 NOTE — Telephone Encounter (Signed)
 Neurologist office is waiting for you completed notes to be able to schedule this patient. You had marked this as urgent, so I called their office this morning to follow up with this.

## 2024-10-31 NOTE — Telephone Encounter (Signed)
 Neurology is waiting on notes to be signed

## 2024-10-31 NOTE — Telephone Encounter (Signed)
 Notes should be complete.

## 2024-11-01 ENCOUNTER — Encounter: Payer: Self-pay | Admitting: Neurology

## 2024-11-01 NOTE — Telephone Encounter (Signed)
 Notes are signed now

## 2024-11-14 ENCOUNTER — Telehealth: Payer: Self-pay | Admitting: Internal Medicine

## 2024-11-14 NOTE — Telephone Encounter (Signed)
 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

## 2024-12-16 ENCOUNTER — Ambulatory Visit: Payer: Self-pay | Admitting: Neurology

## 2024-12-31 ENCOUNTER — Inpatient Hospital Stay

## 2025-01-08 ENCOUNTER — Inpatient Hospital Stay: Admitting: Internal Medicine
